# Patient Record
Sex: Male | Born: 1965 | Race: Black or African American | Hispanic: No | Marital: Married | Smoking: Never smoker
Health system: Southern US, Community
[De-identification: ages and names within clinical notes are randomized; demographics above are authoritative.]

## PROBLEM LIST (undated history)

## (undated) DIAGNOSIS — I10 Essential (primary) hypertension: Secondary | ICD-10-CM

## (undated) DIAGNOSIS — G4730 Sleep apnea, unspecified: Secondary | ICD-10-CM

## (undated) DIAGNOSIS — E119 Type 2 diabetes mellitus without complications: Secondary | ICD-10-CM

## (undated) DIAGNOSIS — E7800 Pure hypercholesterolemia, unspecified: Secondary | ICD-10-CM

## (undated) DIAGNOSIS — R609 Edema, unspecified: Secondary | ICD-10-CM

## (undated) DIAGNOSIS — R0602 Shortness of breath: Secondary | ICD-10-CM

## (undated) HISTORY — PX: FOOT SURGERY: SHX648

## (undated) HISTORY — DX: Shortness of breath: R06.02

## (undated) HISTORY — DX: Sleep apnea, unspecified: G47.30

## (undated) HISTORY — DX: Edema, unspecified: R60.9

## (undated) HISTORY — PX: KNEE SURGERY: SHX244

## (undated) HISTORY — PX: CARPAL TUNNEL RELEASE: SHX101

---

## 2002-09-27 ENCOUNTER — Emergency Department (HOSPITAL_COMMUNITY): Admission: EM | Admit: 2002-09-27 | Discharge: 2002-09-27 | Payer: Self-pay | Admitting: Emergency Medicine

## 2006-04-24 ENCOUNTER — Emergency Department (HOSPITAL_COMMUNITY): Admission: EM | Admit: 2006-04-24 | Discharge: 2006-04-24 | Payer: Self-pay | Admitting: Family Medicine

## 2008-11-02 ENCOUNTER — Inpatient Hospital Stay (HOSPITAL_COMMUNITY): Admission: EM | Admit: 2008-11-02 | Discharge: 2008-11-08 | Payer: Self-pay | Admitting: Emergency Medicine

## 2008-11-05 ENCOUNTER — Ambulatory Visit: Payer: Self-pay | Admitting: Physical Medicine & Rehabilitation

## 2009-01-07 ENCOUNTER — Encounter: Admission: RE | Admit: 2009-01-07 | Discharge: 2009-02-07 | Payer: Self-pay | Admitting: Orthopedic Surgery

## 2010-02-28 ENCOUNTER — Ambulatory Visit: Payer: Self-pay | Admitting: Diagnostic Radiology

## 2010-02-28 ENCOUNTER — Emergency Department (HOSPITAL_BASED_OUTPATIENT_CLINIC_OR_DEPARTMENT_OTHER): Admission: EM | Admit: 2010-02-28 | Discharge: 2010-02-28 | Payer: Self-pay | Admitting: Emergency Medicine

## 2010-12-29 LAB — URINALYSIS, ROUTINE W REFLEX MICROSCOPIC
Bilirubin Urine: NEGATIVE
Glucose, UA: >1000 mg/dL — AB
Hgb urine dipstick: NEGATIVE
Ketones, ur: 15 mg/dL — AB
Leukocytes, UA: NEGATIVE
Protein, ur: NEGATIVE mg/dL
Specific Gravity, Urine: >1.046 — ABNORMAL HIGH (ref 1.005–1.030)
Urobilinogen, UA: 1 mg/dL (ref 0.0–1.0)

## 2010-12-29 LAB — CBC
HCT: 35.1 % — ABNORMAL LOW (ref 39.0–52.0)
HCT: 35.1 % — ABNORMAL LOW (ref 39.0–52.0)
Hemoglobin: 11.9 g/dL — ABNORMAL LOW (ref 13.0–17.0)
MCHC: 34 g/dL (ref 30.0–36.0)
MCHC: 34 g/dL (ref 30.0–36.0)
MCV: 86.1 fL (ref 78.0–100.0)
MCV: 87.3 fL (ref 78.0–100.0)
Platelets: 268 10*3/uL (ref 150–400)
Platelets: 284 10*3/uL (ref 150–400)
RBC: 4.02 MIL/uL — ABNORMAL LOW (ref 4.22–5.81)
RDW: 14.7 % (ref 11.5–15.5)
RDW: 15.1 % (ref 11.5–15.5)
WBC: 10.9 10*3/uL — ABNORMAL HIGH (ref 4.0–10.5)
WBC: 9 10*3/uL (ref 4.0–10.5)

## 2010-12-29 LAB — DIFFERENTIAL
Basophils Absolute: 0 10*3/uL (ref 0.0–0.1)
Monocytes Absolute: 0.8 10*3/uL (ref 0.1–1.0)
Neutrophils Relative %: 90 % — ABNORMAL HIGH (ref 43–77)

## 2010-12-29 LAB — GLUCOSE, CAPILLARY
Glucose-Capillary: 134 mg/dL — ABNORMAL HIGH (ref 70–99)
Glucose-Capillary: 142 mg/dL — ABNORMAL HIGH (ref 70–99)
Glucose-Capillary: 173 mg/dL — ABNORMAL HIGH (ref 70–99)
Glucose-Capillary: 178 mg/dL — ABNORMAL HIGH (ref 70–99)
Glucose-Capillary: 179 mg/dL — ABNORMAL HIGH (ref 70–99)
Glucose-Capillary: 185 mg/dL — ABNORMAL HIGH (ref 70–99)
Glucose-Capillary: 193 mg/dL — ABNORMAL HIGH (ref 70–99)
Glucose-Capillary: 194 mg/dL — ABNORMAL HIGH (ref 70–99)
Glucose-Capillary: 196 mg/dL — ABNORMAL HIGH (ref 70–99)
Glucose-Capillary: 206 mg/dL — ABNORMAL HIGH (ref 70–99)
Glucose-Capillary: 208 mg/dL — ABNORMAL HIGH (ref 70–99)
Glucose-Capillary: 214 mg/dL — ABNORMAL HIGH (ref 70–99)
Glucose-Capillary: 223 mg/dL — ABNORMAL HIGH (ref 70–99)
Glucose-Capillary: 249 mg/dL — ABNORMAL HIGH (ref 70–99)
Glucose-Capillary: 266 mg/dL — ABNORMAL HIGH (ref 70–99)

## 2010-12-29 LAB — POCT I-STAT, CHEM 8
Calcium, Ion: 1.18 mmol/L (ref 1.12–1.32)
Glucose, Bld: 233 mg/dL — ABNORMAL HIGH (ref 70–99)
Hemoglobin: 15.3 g/dL (ref 13.0–17.0)
Potassium: 4.2 mEq/L (ref 3.5–5.1)

## 2010-12-29 LAB — COMPREHENSIVE METABOLIC PANEL
AST: 24 U/L (ref 0–37)
BUN: 12 mg/dL (ref 6–23)
Chloride: 105 mEq/L (ref 96–112)
GFR calc Af Amer: >60 mL/min (ref >60)
GFR calc non Af Amer: >60 mL/min (ref >60)
Glucose, Bld: 233 mg/dL — ABNORMAL HIGH (ref 70–99)
Potassium: 4.2 mEq/L (ref 3.5–5.1)
Sodium: 138 mEq/L (ref 135–145)
Total Bilirubin: 0.9 mg/dL (ref 0.3–1.2)

## 2010-12-29 LAB — BASIC METABOLIC PANEL
BUN: 7 mg/dL (ref 6–23)
CO2: 31 mEq/L (ref 19–32)
GFR calc non Af Amer: >60 mL/min (ref >60)
Glucose, Bld: 186 mg/dL — ABNORMAL HIGH (ref 70–99)
Potassium: 4.4 mEq/L (ref 3.5–5.1)

## 2010-12-29 LAB — URINE MICROSCOPIC-ADD ON
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
## 2016-11-29 ENCOUNTER — Ambulatory Visit (INDEPENDENT_AMBULATORY_CARE_PROVIDER_SITE_OTHER): Payer: BLUE CROSS/BLUE SHIELD

## 2016-11-29 ENCOUNTER — Encounter (HOSPITAL_COMMUNITY): Payer: Self-pay | Admitting: Emergency Medicine

## 2016-11-29 ENCOUNTER — Ambulatory Visit (HOSPITAL_COMMUNITY)
Admission: EM | Admit: 2016-11-29 | Discharge: 2016-11-29 | Disposition: A | Discharge status: Home / Self Care | Payer: BLUE CROSS/BLUE SHIELD | Attending: Family Medicine | Admitting: Family Medicine

## 2016-11-29 DIAGNOSIS — J9801 Acute bronchospasm: Secondary | ICD-10-CM | POA: Diagnosis not present

## 2016-11-29 DIAGNOSIS — J019 Acute sinusitis, unspecified: Secondary | ICD-10-CM

## 2016-11-29 HISTORY — DX: Pure hypercholesterolemia, unspecified: E78.00

## 2016-11-29 HISTORY — DX: Type 2 diabetes mellitus without complications: E11.9

## 2016-11-29 HISTORY — DX: Essential (primary) hypertension: I10

## 2016-11-29 MED ORDER — IPRATROPIUM-ALBUTEROL 0.5-2.5 (3) MG/3ML IN SOLN
RESPIRATORY_TRACT | Status: AC
  Filled 2016-11-29: qty 3

## 2016-11-29 MED ORDER — CEFDINIR 300 MG PO CAPS: 300.0000 mg | ORAL_CAPSULE | Freq: Two times a day (BID) | ORAL | 0 refills | Status: DC

## 2016-11-29 MED ORDER — ALBUTEROL SULFATE HFA 108 (90 BASE) MCG/ACT IN AERS: 2.0000 | INHALATION_SPRAY | RESPIRATORY_TRACT | 0 refills | Status: DC | PRN

## 2016-11-29 MED ORDER — IPRATROPIUM-ALBUTEROL 0.5-2.5 (3) MG/3ML IN SOLN
3.0000 mL | Freq: Once | RESPIRATORY_TRACT | Status: AC
  Administered 2016-11-29: 3 mL via RESPIRATORY_TRACT

## 2016-11-29 NOTE — ED Provider Notes (Signed)
CSN: [id number]     Arrival date & time 11/29/16  1746 History   First MD Initiated Contact with Patient 11/29/16 1844     Chief Complaint  Patient presents with  . Cough   (Consider location/radiation/quality/duration/timing/severity/associated sxs/prior Treatment) 51 year old obese male complaining of cough, sinus congestion for 3 weeks. He saw a health care provider and was prescribed amoxicillin. States he was better for 2 days and then his symptoms return. Is complaining of shortness of breath, chest pain when taking a deep breath, feeling weak and possibly a subjective fever although not taken. Currently temp is 99.5 pulse ox 100%.      Past Medical History:  Diagnosis Date  . Diabetes mellitus without complication ([name])   . High cholesterol   . Hypertension    History reviewed. No pertinent surgical history. No family history on file. Social History  Substance Use Topics  . Smoking status: Not on file  . Smokeless tobacco: Not on file  . Alcohol use Not on file    Review of Systems  Constitutional: Positive for activity change and fever.  HENT: Positive for congestion and sinus pressure. Negative for ear pain and sore throat.   Respiratory: Positive for cough and shortness of breath.   Gastrointestinal: Negative.   All other systems reviewed and are negative.   Allergies  Codeine  Home Medications   Prior to Admission medications   Medication Sig Start Date End Date Taking? Authorizing Provider  albuterol (PROVENTIL HFA;VENTOLIN HFA) 108 (90 Base) MCG/ACT inhaler Inhale 2 puffs into the lungs every 4 (four) hours as needed for wheezing or shortness of breath. 11/29/16   Hayden Rasmussen, NP  cefdinir (OMNICEF) 300 MG capsule Take 1 capsule (300 mg total) by mouth 2 (two) times daily. 11/29/16   Hayden Rasmussen, NP   Meds Ordered and Administered this Visit   Medications  ipratropium-albuterol (DUONEB) 0.5-2.5 (3) MG/3ML nebulizer solution 3 mL (3 mLs Nebulization Given  11/29/16 1910)    BP 139/68 (BP Location: Right Arm)   Pulse 70   Temp 99.5 F (37.5 C)   SpO2 100%  No data found.   Physical Exam  Constitutional: He is oriented to person, place, and time. He appears well-developed and well-nourished. No distress.  HENT:  Unable to visualize oropharynx due to large tongue and unattainable tongue retraction.  Eyes: EOM are normal.  Neck: Normal range of motion. Neck supple.  Cardiovascular: Normal rate.   Pulmonary/Chest: Effort normal and breath sounds normal. No respiratory distress. He has no wheezes. He has no rales.  Musculoskeletal: He exhibits no edema.  Lymphadenopathy:    He has no cervical adenopathy.  Neurological: He is alert and oriented to person, place, and time.  Skin: Skin is warm.  Nursing note and vitals reviewed.   Urgent Care Course     Procedures (including critical care time)  Labs Review Labs Reviewed - No data to display  Imaging Review Dg Chest 2 View  Result Date: 11/29/2016 CLINICAL DATA:  Cough with chest tightness for 3 weeks EXAM: CHEST  2 VIEW COMPARISON:  11/03/2008 FINDINGS: No focal pulmonary infiltrate, consolidation or effusion. Mild cardiomegaly without overt failure. No pneumothorax. IMPRESSION: 1. No radiographic evidence for acute cardiopulmonary abnormality 2. Mild cardiomegaly without overt failure Electronically Signed   By: Jasmine Pang M.D.   On: 11/29/2016 19:31     Visual Acuity Review  Right Eye Distance:   Left Eye Distance:   Bilateral Distance:    Right Eye Near:  Left Eye Near:    Bilateral Near:         MDM   1. Acute sinusitis, recurrence not specified, unspecified location   2. Cough due to bronchospasm    While examining the patient's lungs he continued to talk with his wife who was on the phone that just rang.  unable to get a good exam while he was talking. I stopped from moment to ask him to take really deep breaths so I could hear better and he then took the  phone away from his wife and started talking. At that point, rather than wait for him to finish his phone call left the room to complete the chart and discharge another patient. Meds ordered this encounter  Medications  . ipratropium-albuterol (DUONEB) 0.5-2.5 (3) MG/3ML nebulizer solution 3 mL  . albuterol (PROVENTIL HFA;VENTOLIN HFA) 108 (90 Base) MCG/ACT inhaler    Sig: Inhale 2 puffs into the lungs every 4 (four) hours as needed for wheezing or shortness of breath.    Dispense:  1 Inhaler    Refill:  0    Order Specific Question:   Supervising Provider    Answer:   Elvina SidleLAUENSTEIN, KURT [[id number]]  . cefdinir (OMNICEF) 300 MG capsule    Sig: Take 1 capsule (300 mg total) by mouth 2 (two) times daily.    Dispense:  14 capsule    Refill:  0    Order Specific Question:   Supervising Provider    Answer:   Elvina SidleLAUENSTEIN, KURT [[id number]]   I have verbally reviewed the discharge instructions with the patient. A printed AVS was given to the patient.  All questions were answered prior to discharge.      Hayden Rasmussenavid Zoiey Christy, NP 11/29/16 1952

## 2016-11-29 NOTE — ED Triage Notes (Signed)
See s/s 

## 2016-11-29 NOTE — Discharge Instructions (Signed)
Sudafed PE 10 mg every 4 to 6 hours as needed for congestion Allegra or Zyrtec daily as needed for drainage and runny nose. For stronger antihistamine may take Chlor-Trimeton 2 to 4 mg every 4 to 6 hours, may cause drowsiness. Saline nasal spray used frequently. Ibuprofen 600 mg every 6 hours as needed for pain, discomfort or fever. Drink plenty of fluids and stay well-hydrated. Use the albuterol inhaler 2 puffs every 4 hours for cough and suspected wheeze.  Antibiotic as directed

## 2016-12-02 ENCOUNTER — Encounter (HOSPITAL_COMMUNITY): Payer: Self-pay | Admitting: Emergency Medicine

## 2016-12-02 ENCOUNTER — Ambulatory Visit (HOSPITAL_COMMUNITY)
Admission: EM | Admit: 2016-12-02 | Discharge: 2016-12-02 | Disposition: A | Discharge status: Home / Self Care | Payer: BLUE CROSS/BLUE SHIELD | Attending: Family Medicine | Admitting: Family Medicine

## 2016-12-02 DIAGNOSIS — R059 Cough, unspecified: Secondary | ICD-10-CM

## 2016-12-02 DIAGNOSIS — R05 Cough: Secondary | ICD-10-CM

## 2016-12-02 DIAGNOSIS — J209 Acute bronchitis, unspecified: Secondary | ICD-10-CM | POA: Diagnosis not present

## 2016-12-02 MED ORDER — ALBUTEROL SULFATE (2.5 MG/3ML) 0.083% IN NEBU
INHALATION_SOLUTION | RESPIRATORY_TRACT | Status: AC
  Filled 2016-12-02: qty 3

## 2016-12-02 MED ORDER — BENZONATATE 100 MG PO CAPS: 200.0000 mg | ORAL_CAPSULE | Freq: Three times a day (TID) | ORAL | 0 refills | Status: DC | PRN

## 2016-12-02 MED ORDER — METHYLPREDNISOLONE 4 MG PO TBPK: ORAL_TABLET | ORAL | 0 refills | Status: DC

## 2016-12-02 MED ORDER — ALBUTEROL SULFATE (2.5 MG/3ML) 0.083% IN NEBU
2.5000 mg | INHALATION_SOLUTION | Freq: Once | RESPIRATORY_TRACT | Status: AC
  Administered 2016-12-02: 2.5 mg via RESPIRATORY_TRACT

## 2016-12-02 NOTE — ED Triage Notes (Addendum)
PT reports he was here Monday and diagnosed with bronchitis. PT has been on antibiotics since Monday. PT reports cough has persisted. PT reports cold sweats started today. PT reports chest pain, but only while coughing. PT describes pain while coughing as a chest tightness

## 2016-12-02 NOTE — ED Provider Notes (Signed)
CSN: [id number]     Arrival date & time 12/02/16  1221 History   First MD Initiated Contact with Patient 12/02/16 1234     Chief Complaint  Patient presents with  . Cough   (Consider location/radiation/quality/duration/timing/severity/associated sxs/prior Treatment) Patient c/o cough.  He has recently been seen and dx with sinus infection and bronchitis.  His cough is worse and more persistent.  He had a cxr which was normal.   The history is provided by the patient.  Cough  Cough characteristics:  Productive Sputum characteristics:  White Severity:  Severe Onset quality:  Sudden Duration:  1 week Timing:  Constant Progression:  Worsening Chronicity:  New Smoker: no   Context: upper respiratory infection and weather changes   Relieved by:  Nothing Worsened by:  Nothing Ineffective treatments:  None tried   Past Medical History:  Diagnosis Date  . Diabetes mellitus without complication ([name])   . High cholesterol   . Hypertension    History reviewed. No pertinent surgical history. No family history on file. Social History  Substance Use Topics  . Smoking status: Never Smoker  . Smokeless tobacco: Never Used  . Alcohol use No    Review of Systems  Constitutional: Negative.   HENT: Negative.   Eyes: Negative.   Respiratory: Positive for cough.   Cardiovascular: Negative.   Gastrointestinal: Negative.   Endocrine: Negative.   Genitourinary: Negative.   Musculoskeletal: Negative.   Allergic/Immunologic: Negative.   Neurological: Negative.   Hematological: Negative.   Psychiatric/Behavioral: Negative.     Allergies  Codeine  Home Medications   Prior to Admission medications   Medication Sig Start Date End Date Taking? Authorizing Provider  albuterol (PROVENTIL HFA;VENTOLIN HFA) 108 (90 Base) MCG/ACT inhaler Inhale 2 puffs into the lungs every 4 (four) hours as needed for wheezing or shortness of breath. 11/29/16   Hayden Rasmussenavid Mabe, NP  benzonatate (TESSALON) 100  MG capsule Take 2 capsules (200 mg total) by mouth 3 (three) times daily as needed for cough. 12/02/16   Deatra CanterWilliam J Tushar Enns, FNP  cefdinir (OMNICEF) 300 MG capsule Take 1 capsule (300 mg total) by mouth 2 (two) times daily. 11/29/16   Hayden Rasmussenavid Mabe, NP  methylPREDNISolone (MEDROL DOSEPAK) 4 MG TBPK tablet Take 6-5-4-3-2-1 po qd 12/02/16   Deatra CanterWilliam J Johnnie Goynes, FNP   Meds Ordered and Administered this Visit   Medications  albuterol (PROVENTIL) (2.5 MG/3ML) 0.083% nebulizer solution 2.5 mg (2.5 mg Nebulization Given 12/02/16 1306)    BP 138/84 (BP Location: Left Arm)   Pulse (!) 58   Temp 98.4 F (36.9 C) (Oral)   Resp 16   Ht 6' (1.829 m)   Wt (!) 364 lb (165.1 kg)   SpO2 96%   BMI 49.37 kg/m  No data found.   Physical Exam  Constitutional: He is oriented to person, place, and time. He appears well-developed and well-nourished.  HENT:  Head: Normocephalic and atraumatic.  Right Ear: External ear normal.  Left Ear: External ear normal.  Mouth/Throat: Oropharynx is clear and moist.  Eyes: Conjunctivae and EOM are normal. Pupils are equal, round, and reactive to light.  Neck: Normal range of motion. Neck supple.  Cardiovascular: Normal rate, regular rhythm and normal heart sounds.   Pulmonary/Chest: Effort normal and breath sounds normal.  Abdominal: Soft. Bowel sounds are normal.  Musculoskeletal: Normal range of motion.  Neurological: He is alert and oriented to person, place, and time.  Nursing note and vitals reviewed.   Urgent Care Course  Procedures (including critical care time)  Labs Review Labs Reviewed - No data to display  Imaging Review No results found.   Visual Acuity Review  Right Eye Distance:   Left Eye Distance:   Bilateral Distance:    Right Eye Near:   Left Eye Near:    Bilateral Near:         MDM   1. Acute bronchitis, unspecified organism   2. Cough    Neb tx Medrol dose pack as directed Tessalon Perle 200mg  one po tid prn #21 Continue  current abx's and continue with albuterol MDI  Push po fluids, rest, tylenol and motrin otc prn as directed for fever, arthralgias, and myalgias.  Follow up prn if sx's continue or persist.    Deatra Canter, FNP 12/02/16 1331

## 2018-11-01 ENCOUNTER — Other Ambulatory Visit: Payer: Self-pay | Admitting: Internal Medicine

## 2018-11-01 ENCOUNTER — Ambulatory Visit
Admission: RE | Admit: 2018-11-01 | Discharge: 2018-11-01 | Disposition: A | Discharge status: Home / Self Care | Payer: BLUE CROSS/BLUE SHIELD | Source: Ambulatory Visit | Attending: Internal Medicine | Admitting: Internal Medicine

## 2018-11-01 DIAGNOSIS — R05 Cough: Secondary | ICD-10-CM

## 2018-11-01 DIAGNOSIS — R059 Cough, unspecified: Secondary | ICD-10-CM

## 2020-10-23 ENCOUNTER — Ambulatory Visit
Admission: RE | Admit: 2020-10-23 | Discharge: 2020-10-23 | Disposition: A | Discharge status: Home / Self Care | Payer: 59 | Source: Ambulatory Visit | Attending: Internal Medicine | Admitting: Internal Medicine

## 2020-10-23 ENCOUNTER — Other Ambulatory Visit: Payer: Self-pay | Admitting: Internal Medicine

## 2020-10-23 DIAGNOSIS — J1282 Pneumonia due to coronavirus disease 2019: Secondary | ICD-10-CM

## 2020-10-23 DIAGNOSIS — U071 COVID-19: Secondary | ICD-10-CM

## 2020-12-03 ENCOUNTER — Other Ambulatory Visit: Payer: Self-pay

## 2020-12-03 ENCOUNTER — Ambulatory Visit (INDEPENDENT_AMBULATORY_CARE_PROVIDER_SITE_OTHER): Payer: 59 | Admitting: Pulmonary Disease

## 2020-12-03 ENCOUNTER — Encounter: Payer: Self-pay | Admitting: Pulmonary Disease

## 2020-12-03 VITALS — BP 138/88 | HR 94 | Temp 97.2°F | Ht 73.0 in | Wt >= 6400 oz

## 2020-12-03 DIAGNOSIS — R0683 Snoring: Secondary | ICD-10-CM

## 2020-12-03 DIAGNOSIS — U071 COVID-19: Secondary | ICD-10-CM

## 2020-12-03 DIAGNOSIS — R0600 Dyspnea, unspecified: Secondary | ICD-10-CM

## 2020-12-03 DIAGNOSIS — R0609 Other forms of dyspnea: Secondary | ICD-10-CM

## 2020-12-03 DIAGNOSIS — R600 Localized edema: Secondary | ICD-10-CM

## 2020-12-03 DIAGNOSIS — R079 Chest pain, unspecified: Secondary | ICD-10-CM

## 2020-12-03 DIAGNOSIS — J1282 Pneumonia due to coronavirus disease 2019: Secondary | ICD-10-CM

## 2020-12-03 LAB — COMPREHENSIVE METABOLIC PANEL
ALT: 11 U/L (ref 0–53)
AST: 10 U/L (ref 0–37)
Albumin: 4 g/dL (ref 3.5–5.2)
Alkaline Phosphatase: 60 U/L (ref 39–117)
BUN: 22 mg/dL (ref 6–23)
CO2: 30 mEq/L (ref 19–32)
Calcium: 9.6 mg/dL (ref 8.4–10.5)
Chloride: 98 mEq/L (ref 96–112)
Creatinine, Ser: 1 mg/dL (ref 0.40–1.50)
GFR: 85.61 mL/min (ref >60.00)
Glucose, Bld: 232 mg/dL — ABNORMAL HIGH (ref 70–99)
Potassium: 4.5 mEq/L (ref 3.5–5.1)
Sodium: 136 mEq/L (ref 135–145)
Total Bilirubin: 0.9 mg/dL (ref 0.2–1.2)
Total Protein: 7.6 g/dL (ref 6.0–8.3)

## 2020-12-03 LAB — BRAIN NATRIURETIC PEPTIDE: Pro B Natriuretic peptide (BNP): 14 pg/mL (ref 0.0–100.0)

## 2020-12-03 LAB — D-DIMER, QUANTITATIVE: D-Dimer, Quant: 0.31 mcg/mL FEU (ref <0.50)

## 2020-12-03 MED ORDER — FLOVENT HFA 110 MCG/ACT IN AERO: 2.0000 | INHALATION_SPRAY | Freq: Two times a day (BID) | RESPIRATORY_TRACT | 12 refills | Status: DC

## 2020-12-03 NOTE — Patient Instructions (Addendum)
Start Flovent 2 puffs twice daily  Use albuterol inhaler as needed 1-2 puffs every 4-6 hours for cough and shortness of breath  We will check labs today  We will schedule you for ultrasound of your lower extremities to evaluate the leg swelling.  We will schedule you for a home sleep study to evaluate for sleep apnea

## 2020-12-03 NOTE — Progress Notes (Signed)
Synopsis: Referred in March 2022 by Dr. Lorene Dy for post-covid 19 symptoms  Subjective:   PATIENT ID: Philip [name] GENDER: male DOB: Apr 18, 1966, MRN: [id number]  HPI  Chief Complaint  Patient presents with  . Consult    Covid PNA in Jan 2022, cough, very SOB, chest pain   Philip [name] is a 55 year old male, never smoker with diabetes and hypertension who is referred to pulmonary clinic for post-covid 19 symptoms.   He was sick with covid in January 2022. He was vaccinated but not boosted. He reports exertional dyspnea and dry cough since January. He also reports fatigue and brain fog. He denies wheezing. He is also having lower extremity edema which he was previously on lasix $Remove'20mg'abCjKfh$  daily. He does report occasional nightime awakenings with dyspnea and cough. He reports history of snoring.   He reports right sided chest pain that is worse with movement and coughing. The pain is not worse with eating.   Past Medical History:  Diagnosis Date  . Diabetes mellitus without complication ([place])   . High cholesterol   . Hypertension      History reviewed. No pertinent family history.   Social History   Socioeconomic History  . Marital status: Married    Spouse name: Not on file  . Number of children: Not on file  . Years of education: Not on file  . Highest education level: Not on file  Occupational History  . Not on file  Tobacco Use  . Smoking status: Never Smoker  . Smokeless tobacco: Never Used  Substance and Sexual Activity  . Alcohol use: No  . Drug use: No  . Sexual activity: Not on file  Other Topics Concern  . Not on file  Social History Narrative  . Not on file   Social Determinants of Health   Financial Resource Strain: Not on file  Food Insecurity: Not on file  Transportation Needs: Not on file  Physical Activity: Not on file  Stress: Not on file  Social Connections: Not on file  Intimate Partner Violence: Not on file     Allergies  Allergen  Reactions  . Metformin Diarrhea  . Codeine      Outpatient Medications Prior to Visit  Medication Sig Dispense Refill  . albuterol (PROVENTIL HFA;VENTOLIN HFA) 108 (90 Base) MCG/ACT inhaler Inhale 2 puffs into the lungs every 4 (four) hours as needed for wheezing or shortness of breath. 1 Inhaler 0  . atenolol (TENORMIN) 50 MG tablet Take 50 mg by mouth daily.    [name] atorvastatin (LIPITOR) 20 MG tablet Take 20 mg by mouth daily.    . benzonatate (TESSALON) 100 MG capsule Take 2 capsules (200 mg total) by mouth 3 (three) times daily as needed for cough. 21 capsule 0  . FARXIGA 10 MG TABS tablet Take 10 mg by mouth daily.    . furosemide (LASIX) 20 MG tablet Take 20 mg by mouth.    . cefdinir (OMNICEF) 300 MG capsule Take 1 capsule (300 mg total) by mouth 2 (two) times daily. 14 capsule 0  . methylPREDNISolone (MEDROL DOSEPAK) 4 MG TBPK tablet Take 6-5-4-3-2-1 po qd 21 tablet 0   No facility-administered medications prior to visit.    Review of Systems  Constitutional: Positive for malaise/fatigue. Negative for chills, fever and weight loss.  HENT: Negative for congestion, sinus pain and sore throat.   Eyes: Negative.   Respiratory: Positive for cough and shortness of breath. Negative for hemoptysis, sputum  production and wheezing.   Cardiovascular: Positive for leg swelling. Negative for chest pain, palpitations, orthopnea and claudication.  Gastrointestinal: Negative for abdominal pain, heartburn, nausea and vomiting.  Genitourinary: Negative.   Musculoskeletal: Negative for joint pain and myalgias.  Skin: Negative for rash.  Neurological: Negative for weakness.  Endo/Heme/Allergies: Negative.   Psychiatric/Behavioral: Negative.     Objective:   Vitals:   12/03/20 0930  BP: 138/88  Pulse: 94  Temp: (!) 97.2 F (36.2 C)  TempSrc: Oral  SpO2: 95%  Weight: (!) 419 lb 12.8 oz (190.4 kg)  Height: $Remove'6\' 1"'TFfNHlC$  (1.854 m)   Physical Exam Constitutional:      General: He is not in  acute distress.    Appearance: He is obese. He is not ill-appearing.  HENT:     Head: Normocephalic and atraumatic.  Eyes:     Extraocular Movements: Extraocular movements intact.     Conjunctiva/sclera: Conjunctivae normal.     Pupils: Pupils are equal, round, and reactive to light.  Cardiovascular:     Rate and Rhythm: Normal rate and regular rhythm.     Pulses: Normal pulses.     Heart sounds: Normal heart sounds. No murmur heard.   Pulmonary:     Effort: Pulmonary effort is normal.     Breath sounds: Decreased air movement present. No wheezing, rhonchi or rales.  Abdominal:     General: Bowel sounds are normal.     Palpations: Abdomen is soft.  Musculoskeletal:     Right lower leg: Edema present.     Left lower leg: Edema present.  Lymphadenopathy:     Cervical: No cervical adenopathy.  Skin:    General: Skin is warm and dry.  Neurological:     General: No focal deficit present.     Mental Status: He is alert.  Psychiatric:        Mood and Affect: Mood normal.        Behavior: Behavior normal.        Thought Content: Thought content normal.        Judgment: Judgment normal.    CBC    Component Value Date/Time   WBC 9.0 11/05/2008 0446   RBC 4.10 (L) 11/05/2008 0446   HGB 12.1 (L) 11/05/2008 0446   HCT 35.3 (L) 11/05/2008 0446   PLT 284 11/05/2008 0446   MCV 86.1 11/05/2008 0446   MCHC 34.2 11/05/2008 0446   RDW 14.8 11/05/2008 0446   LYMPHSABS 1.3 11/01/2008 1855   MONOABS 0.8 11/01/2008 1855   EOSABS 0.0 11/01/2008 1855   BASOSABS 0.0 11/01/2008 1855    Chest imaging: CXR 10/23/20 Heart size is normal. Mediastinal shadows are normal. The lungs are clear. No infiltrate, collapse or effusion. No significant bone Finding.  PFT: No flowsheet data found.  Assessment & Plan:   Pneumonia due to COVID-19 virus - Plan: fluticasone (FLOVENT HFA) 110 MCG/ACT inhaler  Bilateral lower extremity edema - Plan: D-Dimer, Quantitative, VAS [place] LOWER EXTREMITY  VENOUS (DVT), Comp Met (CMET), Comp Met (CMET), D-Dimer, Quantitative  Dyspnea on exertion - Plan: B Nat Peptide, B Nat Peptide  Chest pain, unspecified type - Plan: D-Dimer, Quantitative, D-Dimer, Quantitative  Snoring - Plan: Home sleep test  Discussion: Philip [name] is a 55 year old male, never smoker with diabetes and hypertension who is referred to pulmonary clinic for post-covid 19 symptoms.   He has exertional shortness of breath after having covid 19 infection which he likely had a component of pneumonia at that  time with chest radiograph on 10/23/20 showing no patchy opacities. His dyspnea also could be related to heart failure as he has lower extremity edema. Other concerns would be for DVT or PE. He is also obese which is contributing to his dyspnea with possible underlying sleep apnea.   We will check ddimer, BNP and CMP today. If ddimer is elevated then we will check a CTA chest. If BNP is elevated then we will check an ECHO.   We will check lower extremity US to rule out DVT.  He is to be scheduled for a home sleep study.  Follow up in 3 months.   Freda Jackson, MD [facility] Office: [phone]   Current Outpatient Medications:  .  albuterol (PROVENTIL HFA;VENTOLIN HFA) 108 (90 Base) MCG/ACT inhaler, Inhale 2 puffs into the lungs every 4 (four) hours as needed for wheezing or shortness of breath., Disp: 1 Inhaler, Rfl: 0 .  atenolol (TENORMIN) 50 MG tablet, Take 50 mg by mouth daily., Disp: , Rfl:  .  atorvastatin (LIPITOR) 20 MG tablet, Take 20 mg by mouth daily., Disp: , Rfl:  .  benzonatate (TESSALON) 100 MG capsule, Take 2 capsules (200 mg total) by mouth 3 (three) times daily as needed for cough., Disp: 21 capsule, Rfl: 0 .  FARXIGA 10 MG TABS tablet, Take 10 mg by mouth daily., Disp: , Rfl:  .  fluticasone (FLOVENT HFA) 110 MCG/ACT inhaler, Inhale 2 puffs into the lungs 2 (two) times daily., Disp: 1 each, Rfl: 12 .  furosemide (LASIX) 20  MG tablet, Take 20 mg by mouth., Disp: , Rfl:

## 2020-12-05 ENCOUNTER — Other Ambulatory Visit: Payer: Self-pay

## 2020-12-05 ENCOUNTER — Encounter: Payer: Self-pay | Admitting: *Deleted

## 2020-12-05 ENCOUNTER — Ambulatory Visit (HOSPITAL_COMMUNITY)
Admission: RE | Admit: 2020-12-05 | Discharge: 2020-12-05 | Disposition: A | Discharge status: Home / Self Care | Payer: 59 | Source: Ambulatory Visit | Attending: Pulmonary Disease | Admitting: Pulmonary Disease

## 2020-12-05 DIAGNOSIS — R600 Localized edema: Secondary | ICD-10-CM | POA: Diagnosis present

## 2020-12-17 ENCOUNTER — Telehealth: Payer: Self-pay | Admitting: Pulmonary Disease

## 2020-12-17 DIAGNOSIS — R0600 Dyspnea, unspecified: Secondary | ICD-10-CM

## 2020-12-17 DIAGNOSIS — R0609 Other forms of dyspnea: Secondary | ICD-10-CM

## 2020-12-17 MED ORDER — FUROSEMIDE 20 MG PO TABS: 20.0000 mg | ORAL_TABLET | Freq: Every day | ORAL | 1 refills | Status: DC

## 2020-12-17 NOTE — Telephone Encounter (Signed)
Has he been scheduled for the home sleep study yet? I believe his dyspnea is related to underlying sleep apnea.   Recommend continued weight loss.  Please send in prescription for lasix 20mg  daily. He will need to have a BMP checked 1 week after starting this medication to monitor his kidney function and electrolytes.   Thanks, 

## 2020-12-17 NOTE — Telephone Encounter (Signed)
Called and spoke with pt who states he still has complaints of SOB which is about the same since last visit. Pt states that he has been using the flovent inhaler twice daily as prescribed.  States that he tries not to use the albuterol inhaler due to it making him feel jittery.  Pt wants to know if there is something else that might be able to be recommended to help with his symptoms due to the SOB not getting any better.  Pt was made aware of the results of recent tests.  Dr. Francine Graven, please advise.

## 2020-12-17 NOTE — Telephone Encounter (Signed)
Called and spoke with pt letting him know the info stated by Dr. Francine Graven. Stated to pt that we were going to send Rx for Lasix to the pharmacy for him and he verbalized understanding. Verified pt's preferred pharmacy and have sent Rx in for pt.  Asked pt if he had heard anything yet about getting home sleep study scheduled and he stated that he had not heard yet. Know that [facility] are about 1-3 weeks out on getting pts scheduled for sleep studies. [facility], please advise on when we might be able to get pt in for HST?

## 2020-12-18 NOTE — Telephone Encounter (Signed)
Pt is scheduled for 4/18 @ 9: 30 AM

## 2020-12-29 ENCOUNTER — Other Ambulatory Visit: Payer: Self-pay

## 2020-12-29 ENCOUNTER — Ambulatory Visit: Payer: 59

## 2020-12-29 DIAGNOSIS — G4733 Obstructive sleep apnea (adult) (pediatric): Secondary | ICD-10-CM | POA: Diagnosis not present

## 2020-12-29 DIAGNOSIS — R0609 Other forms of dyspnea: Secondary | ICD-10-CM

## 2020-12-29 DIAGNOSIS — R0683 Snoring: Secondary | ICD-10-CM

## 2020-12-29 DIAGNOSIS — R0600 Dyspnea, unspecified: Secondary | ICD-10-CM

## 2020-12-29 LAB — BASIC METABOLIC PANEL
BUN: 28 mg/dL — ABNORMAL HIGH (ref 6–23)
CO2: 30 mEq/L (ref 19–32)
Calcium: 9.7 mg/dL (ref 8.4–10.5)
Chloride: 99 mEq/L (ref 96–112)
Creatinine, Ser: 1 mg/dL (ref 0.40–1.50)
GFR: 85.57 mL/min (ref >60.00)
Glucose, Bld: 222 mg/dL — ABNORMAL HIGH (ref 70–99)
Potassium: 4.2 mEq/L (ref 3.5–5.1)
Sodium: 136 mEq/L (ref 135–145)

## 2020-12-30 DIAGNOSIS — G4733 Obstructive sleep apnea (adult) (pediatric): Secondary | ICD-10-CM | POA: Diagnosis not present

## 2021-01-05 ENCOUNTER — Telehealth: Payer: Self-pay | Admitting: Pulmonary Disease

## 2021-01-05 DIAGNOSIS — G4733 Obstructive sleep apnea (adult) (pediatric): Secondary | ICD-10-CM

## 2021-01-05 NOTE — Telephone Encounter (Signed)
Called and spoke with pt who is requesting to know the results of his recent HST which was done 4/18. Dr. Francine Graven, please advise.

## 2021-01-07 NOTE — Telephone Encounter (Signed)
Patient has severe obstructive sleep apnea with oxygen desaturations down to 52% at times. Our sleep specialist has recommended he have a formal CPAP titration study performed. If he is amenable to this, please schedule him for CPAP titration study.   Has he noticed any benefit from the lasix?  Thanks, Philip [name]

## 2021-01-07 NOTE — Telephone Encounter (Signed)
Called and spoke with pt letting him know the results of the HST and stated to him that it is recommended for him to have a cpap titration study due to the HST showing severe OSA with drops in O2 sats. Pt verbalized understanding and stated he was fine with that being ordered so order has been placed.   Asked pt if he has noticed any benefit from the lasix but he states that he is still having problems with SOB as while I was on the phone with him, he had to pause multiple times to try to get his breath.

## 2021-01-07 NOTE — Telephone Encounter (Signed)
Thank you Jon!

## 2021-02-18 ENCOUNTER — Telehealth: Payer: Self-pay | Admitting: Pulmonary Disease

## 2021-02-18 DIAGNOSIS — R0609 Other forms of dyspnea: Secondary | ICD-10-CM

## 2021-02-18 DIAGNOSIS — M7989 Other specified soft tissue disorders: Secondary | ICD-10-CM

## 2021-02-18 NOTE — Telephone Encounter (Signed)
Called and spoke with pt and he stated that he is having issues with swelling, cough and SHOB.    Swelling in his legs, feel and stomach. He is having a dry cough and unable to cough up the phlegm.  Cough started back last week.  He is [facility] with exertion ( walking and standing and he has to rest at times)    He is taking lasix 20 mg daily and flovent 2 puffs BID.  He is scheduled for a sleep study on Monday.  He is wanting to know if he needs to be scheduled for an echo?  Or if JD has any recs.  Thanks  Allergies  Allergen Reactions  . Metformin Diarrhea  . Codeine

## 2021-02-18 NOTE — Telephone Encounter (Signed)
Please schedule patient for PFTs. He can be referred to cardiology for the dyspnea and lower extremity swelling. We will follow up the PFT and sleep study results.   He can take 40mg  of lasix over the next 3 days to see if this helps with the swelling.   Thanks, 

## 2021-02-18 NOTE — Telephone Encounter (Signed)
I have called the pt and he is aware of JD recs. Referral has been placed over to cardiology.   I have scheduled his PFT for 06/16 and he is aware to have the covid test done on 06/14.  He is also aware to take 40 mg of lasix x 3 days then he will go back to the lasix 20 mg daily.  I did call and leave a detailed message on the VM per pts request.  Advised him to call back for anything further .

## 2021-02-23 ENCOUNTER — Other Ambulatory Visit: Payer: Self-pay

## 2021-02-23 ENCOUNTER — Other Ambulatory Visit: Payer: Self-pay | Admitting: Pulmonary Disease

## 2021-02-23 ENCOUNTER — Ambulatory Visit (HOSPITAL_BASED_OUTPATIENT_CLINIC_OR_DEPARTMENT_OTHER): Payer: 59 | Attending: Pulmonary Disease | Admitting: Pulmonary Disease

## 2021-02-23 DIAGNOSIS — G4733 Obstructive sleep apnea (adult) (pediatric): Secondary | ICD-10-CM | POA: Diagnosis not present

## 2021-02-24 ENCOUNTER — Other Ambulatory Visit (HOSPITAL_COMMUNITY)
Admission: RE | Admit: 2021-02-24 | Discharge: 2021-02-24 | Disposition: A | Discharge status: Home / Self Care | Payer: 59 | Source: Ambulatory Visit | Attending: Pulmonary Disease | Admitting: Pulmonary Disease

## 2021-02-24 DIAGNOSIS — Z20822 Contact with and (suspected) exposure to covid-19: Secondary | ICD-10-CM | POA: Diagnosis not present

## 2021-02-24 DIAGNOSIS — Z01812 Encounter for preprocedural laboratory examination: Secondary | ICD-10-CM | POA: Diagnosis not present

## 2021-02-24 LAB — SARS CORONAVIRUS 2 (TAT 6-24 HRS): SARS Coronavirus 2: NEGATIVE

## 2021-02-25 ENCOUNTER — Other Ambulatory Visit: Payer: Self-pay | Admitting: Pulmonary Disease

## 2021-02-25 DIAGNOSIS — R0609 Other forms of dyspnea: Secondary | ICD-10-CM

## 2021-02-25 NOTE — Progress Notes (Signed)
pft  

## 2021-02-26 ENCOUNTER — Ambulatory Visit (INDEPENDENT_AMBULATORY_CARE_PROVIDER_SITE_OTHER): Payer: 59 | Admitting: Pulmonary Disease

## 2021-02-26 ENCOUNTER — Other Ambulatory Visit: Payer: Self-pay

## 2021-02-26 DIAGNOSIS — R0600 Dyspnea, unspecified: Secondary | ICD-10-CM

## 2021-02-26 DIAGNOSIS — R0609 Other forms of dyspnea: Secondary | ICD-10-CM

## 2021-02-26 DIAGNOSIS — G4733 Obstructive sleep apnea (adult) (pediatric): Secondary | ICD-10-CM

## 2021-02-26 LAB — PULMONARY FUNCTION TEST
DL/VA % pred: 115 %
DL/VA: 5.02 ml/min/mmHg/L
DLCO cor % pred: 50 %
DLCO cor: 15 ml/min/mmHg
DLCO unc % pred: 50 %
DLCO unc: 15 ml/min/mmHg
FEF 25-75 Post: 1.28 L/sec
FEF 25-75 Pre: 1.34 L/sec
FEF2575-%Change-Post: -4 %
FEF2575-%Pred-Post: 39 %
FEF2575-%Pred-Pre: 41 %
FEV1-%Change-Post: 0 %
FEV1-%Pred-Post: 46 %
FEV1-%Pred-Pre: 46 %
FEV1-Post: 1.57 L
FEV1-Pre: 1.58 L
FEV1FVC-%Change-Post: 3 %
FEV1FVC-%Pred-Pre: 96 %
FEV6-%Change-Post: -3 %
FEV6-%Pred-Post: 47 %
FEV6-%Pred-Pre: 49 %
FEV6-Post: 1.98 L
FEV6-Pre: 2.06 L
FEV6FVC-%Pred-Post: 103 %
FEV6FVC-%Pred-Pre: 103 %
FVC-%Change-Post: -3 %
FVC-%Pred-Post: 46 %
FVC-%Pred-Pre: 48 %
FVC-Post: 1.98 L
FVC-Pre: 2.06 L
Post FEV1/FVC ratio: 79 %
Post FEV6/FVC ratio: 100 %
Pre FEV1/FVC ratio: 77 %
Pre FEV6/FVC Ratio: 100 %
RV % pred: 111 %
RV: 2.43 L
TLC % pred: 67 %
TLC: 4.81 L

## 2021-02-26 NOTE — Procedures (Signed)
    Patient Name: Philip, [name] Date: 02/23/2021 Gender: Male D.O.B: Jun 06, 1966 Age (years): 55 Referring Provider: Melody Comas Height (inches): 71 Interpreting Physician: Coralyn Helling MD, ABSM Weight (lbs): 419 RPSGT: Lowry Ram BMI: 58 MRN: [id number] Neck Size: 19.50  CLINICAL INFORMATION The patient is referred for a CPAP titration to treat sleep apnea.  Date of HST 12/29/20: AHI 44, SpO2 low 52%.  SLEEP STUDY TECHNIQUE As per the AASM Manual for the Scoring of Sleep and Associated Events v2.3 (April 2016) with a hypopnea requiring 4% desaturations.  The channels recorded and monitored were frontal, central and occipital EEG, electrooculogram (EOG), submentalis EMG (chin), nasal and oral airflow, thoracic and abdominal wall motion, anterior tibialis EMG, snore microphone, electrocardiogram, and pulse oximetry. Bilevel positive airway pressure (BPAP) was initiated at the beginning of the study and titrated to treat sleep-disordered breathing.  MEDICATIONS Medications self-administered by patient taken the night of the study : N/A  RESPIRATORY PARAMETERS Optimal CPAP Pressure (cm): 19 AHI at Optimal Pressure (/hr) 8.9 Overall Minimal O2 (%): 69.00 Minimal O2 at Optimal Pressure (%): 85.0  SLEEP ARCHITECTURE Start Time: 10:51:25 PM Stop Time: 5:08:41 AM Total Time (min): 377.3 Total Sleep Time (min): 335.8 Sleep Latency (min): 6.0 Sleep Efficiency (%): 89.0 REM Latency (min): 96.5 WASO (min): 35.5 Stage N1 (%): 10.81 Stage N2 (%): 65.07 Stage N3 (%): 0.00 Stage R (%): 24.1 Supine (%): 61.58 Arousal Index (/hr): 24.3   CARDIAC DATA The 2 lead EKG demonstrated sinus rhythm. The mean heart rate was 95.59 beats per minute. Other EKG findings include: PVCs.  LEG MOVEMENT DATA The total Periodic Limb Movements of Sleep (PLMS) were 64. The PLMS index was 11.44. A PLMS index of <15 is considered normal in adults.  IMPRESSIONS - He did best with CPAP 19 cm H2O.   - He did not require the use of supplemental oxygen during this study.  DIAGNOSIS - Obstructive Sleep Apnea (G47.33)  RECOMMENDATIONS - Trial of CPAP therapy on 19 cm H2O with a Medium size Fisher&Paykel Full Face Mask Simplus mask and heated humidification. - Avoid alcohol, sedatives and other CNS depressants that may worsen sleep apnea and disrupt normal sleep architecture. - Sleep hygiene should be reviewed to assess factors that may improve sleep quality. - Weight management and regular exercise should be initiated or continued.  [Electronically signed] 02/26/2021 02:18 PM  Coralyn Helling MD, ABSM Diplomate, American Board of Sleep Medicine   NPI: [id number]

## 2021-02-26 NOTE — Progress Notes (Signed)
PFT done today. 

## 2021-02-27 ENCOUNTER — Other Ambulatory Visit: Payer: Self-pay

## 2021-02-27 DIAGNOSIS — R0602 Shortness of breath: Secondary | ICD-10-CM

## 2021-03-05 ENCOUNTER — Ambulatory Visit (INDEPENDENT_AMBULATORY_CARE_PROVIDER_SITE_OTHER)
Admission: RE | Admit: 2021-03-05 | Discharge: 2021-03-05 | Disposition: A | Discharge status: Home / Self Care | Payer: 59 | Source: Ambulatory Visit | Attending: Pulmonary Disease | Admitting: Pulmonary Disease

## 2021-03-05 ENCOUNTER — Inpatient Hospital Stay: Admission: RE | Admit: 2021-03-05 | Payer: 59 | Source: Ambulatory Visit

## 2021-03-05 ENCOUNTER — Other Ambulatory Visit: Payer: Self-pay

## 2021-03-05 ENCOUNTER — Telehealth: Payer: Self-pay

## 2021-03-05 DIAGNOSIS — G4733 Obstructive sleep apnea (adult) (pediatric): Secondary | ICD-10-CM

## 2021-03-05 DIAGNOSIS — R0602 Shortness of breath: Secondary | ICD-10-CM | POA: Diagnosis not present

## 2021-03-05 NOTE — Telephone Encounter (Signed)
-----   Message from Martina Sinner, MD sent at 02/27/2021  1:58 AM EDT ----- Regarding: CPAP Order Hi Soren Lazarz,  Please place orders for a CPAP machine for this patient.   CPAP therapy on 19 cm H2O with a Medium size Fisher&Paykel Full Face Mask Simplus mask and heated humidification.  He will also need follow up with one of our sleep physicians in the future for his OSA.  Thanks, Cletis Athens

## 2021-03-05 NOTE — Telephone Encounter (Signed)
Called patient but he did not answer. Left message for patient to call back.  °

## 2021-03-05 NOTE — Telephone Encounter (Signed)
Called and spoke with patient. He wishes to proceed with the cpap order. I explained to him the process of getting the machine. He wants to hold off on scheduling an appt with a sleep doctor until he has the machine.   Order has been placed. Nothing further needed.

## 2021-03-17 NOTE — Telephone Encounter (Signed)
Hello Dr. Francine Graven, please advise on mychart message:  Dr. Francine Graven,   I need all test results, office notes and a diagnosis asap. Dr. Su Hilt says I have long covid. Is this also your diagnosis? I have not been able to work since the first of January 2022 due to covid-19 and pneumonia.  I am due in [place] on Thursday for court and need the documentation for my illness to take with me or to be emailed to me by close of business on Wednesday, July, 6, 2022. My email is [email or web address]   Please let me know if you need anything else from me to complete my request.   Thanks, Philip [name].  Thank you!

## 2021-05-01 ENCOUNTER — Encounter: Payer: Self-pay | Admitting: Cardiovascular Disease

## 2021-05-01 ENCOUNTER — Ambulatory Visit (INDEPENDENT_AMBULATORY_CARE_PROVIDER_SITE_OTHER): Payer: 59 | Admitting: Cardiovascular Disease

## 2021-05-01 ENCOUNTER — Other Ambulatory Visit: Payer: Self-pay

## 2021-05-01 VITALS — BP 134/70 | HR 69 | Ht 73.0 in | Wt >= 6400 oz

## 2021-05-01 DIAGNOSIS — R600 Localized edema: Secondary | ICD-10-CM | POA: Diagnosis not present

## 2021-05-01 DIAGNOSIS — R0602 Shortness of breath: Secondary | ICD-10-CM | POA: Diagnosis not present

## 2021-05-01 NOTE — Progress Notes (Signed)
Cardiology Office Note:   Date:  05/01/2021  NAME:  Philip [name]    MRN: [id number] DOB:  10/10/1965   PCP:  Burton Apley, MD  Cardiologist:  None  Electrophysiologist:  None   Referring MD: Martina Sinner, MD   Chief Complaint  Patient presents with   Shortness of Breath    History of Present Illness:   Philip [name] is a 55 y.o. male with a hx of DM. HTN, HLD, obesity who is being seen today for the evaluation of SOB at the request of Martina Sinner, MD. Seen by Pulmonary for SOB after Covid. Has severe OSA. Had LE edema. BNP normal. DVT study negative.  He reports he had COVID-19 pneumonia in January 2022.  Since that time he has had shortness of breath and tightness in his chest.  This occurs with exertion.  He also reports lower extremity edema.  He is undergone pulmonary function test that shows restrictive pattern on PFTs.  He also had a diffusion defect.  He underwent high-resolution chest CT which demonstrated no evidence of interstitial lung disease.  He had no evidence of interstitial edema or dilation of the pulmonary artery.  He has had some lower extremity edema.  He reports that he was started on Lasix with improvement.  He has no evidence of volume overload today on exam.  Lungs are clear.  I reviewed a chest x-ray that was clear as well.  There was mention of calcium in the aortic valve.  He has no murmur.  There was minimal calcification in the LAD territory.  His EKG today in office demonstrates sinus rhythm with no acute ischemic changes or evidence of infarction.  His medical history is significant for diabetes, hypertension, hyperlipidemia and morbid obesity.  His BMI is 55.  Weight is 418 pounds.  He reports heart disease in his mother and father.  He has never had a heart attack or stroke.  He does not smoke.  No drugs or alcohol reported.  He has 1 child.  He is married.  His symptoms of shortness of breath occur daily.  He gets profoundly winded when he does  any activity.  Symptoms are minimal at rest.  He can get short of breath with talking.  Problem List DM HTN HLD Severe OSA Morbid Obesity -BMI 58 6.  Restrictive lung disease  Past Medical History: Past Medical History:  Diagnosis Date   Diabetes mellitus without complication ([name])    High cholesterol    Hypertension     Past Surgical History: Past Surgical History:  Procedure Laterality Date   CARPAL TUNNEL RELEASE     KNEE SURGERY      Current Medications: Current Meds  Medication Sig   albuterol (PROVENTIL HFA;VENTOLIN HFA) 108 (90 Base) MCG/ACT inhaler Inhale 2 puffs into the lungs every 4 (four) hours as needed for wheezing or shortness of breath.   atorvastatin (LIPITOR) 20 MG tablet Take 20 mg by mouth daily.   FARXIGA 10 MG TABS tablet Take 10 mg by mouth daily.   fluticasone (FLOVENT HFA) 110 MCG/ACT inhaler Inhale 2 puffs into the lungs 2 (two) times daily.   furosemide (LASIX) 20 MG tablet Take 1 tablet by mouth once daily   glyBURIDE (DIABETA) 5 MG tablet Take 10 mg by mouth 2 (two) times daily.   loratadine (CLARITIN) 10 MG tablet Take 10 mg by mouth daily.   losartan-hydrochlorothiazide (HYZAAR) 50-12.5 MG tablet Take 1 tablet by mouth daily.  pioglitazone (ACTOS) 45 MG tablet Take 45 mg by mouth daily.     Allergies:    Metformin and Codeine   Social History: Social History   Socioeconomic History   Marital status: Married    Spouse name: Not on file   Number of children: 1   Years of education: Not on file   Highest education level: Not on file  Occupational History   Not on file  Tobacco Use   Smoking status: Never   Smokeless tobacco: Never  Substance and Sexual Activity   Alcohol use: No   Drug use: No   Sexual activity: Not on file  Other Topics Concern   Not on file  Social History Narrative   Works at [employer] and [employer]   Social Determinants of Corporate investment banker Strain: Not on file  Food Insecurity: No Food  Insecurity   Worried About Programme researcher, broadcasting/film/video in the Last Year: Never true   Barista in the Last Year: Never true  Transportation Needs: No Transportation Needs   Lack of Transportation (Medical): No   Lack of Transportation (Non-Medical): No  Physical Activity: Not on file  Stress: Not on file  Social Connections: Not on file     Family History: The patient's family history includes Heart disease in his father and mother.  ROS:   All other ROS reviewed and negative. Pertinent positives noted in the HPI.     EKGs/Labs/Other Studies Reviewed:   The following studies were personally reviewed by me today:  EKG:  EKG is ordered today.  The ekg ordered today demonstrates normal sinus rhythm heart rate 69, left axis deviation, and was personally reviewed by me.   Recent Labs: 12/03/2020: ALT 11; Pro B Natriuretic peptide (BNP) 14.0 12/29/2020: BUN 28; Creatinine, Ser 1.00; Potassium 4.2; Sodium 136   Recent Lipid Panel No results found for: CHOL, TRIG, HDL, CHOLHDL, VLDL, LDLCALC, LDLDIRECT  Physical Exam:   VS:  BP 134/70 (BP Location: Left Arm, Patient Position: Sitting, Cuff Size: Large) Comment (Cuff Size): Thigh cuff  Pulse 69   Ht 6\' 1"  (1.854 m)   Wt (!) 418 lb 9.6 oz (189.9 kg)   SpO2 99%   BMI 55.23 kg/m    Wt Readings from Last 3 Encounters:  05/01/21 (!) 418 lb 9.6 oz (189.9 kg)  02/23/21 (!) 419 lb (190.1 kg)  12/03/20 (!) 419 lb 12.8 oz (190.4 kg)    General: Well nourished, well developed, in no acute distress Head: Atraumatic, normal size  Eyes: PEERLA, EOMI  Neck: Supple, no JVD Endocrine: No thryomegaly Cardiac: Normal S1, S2; RRR; no murmurs, rubs, or gallops Lungs: Clear to auscultation bilaterally, no wheezing, rhonchi or rales  Abd: Soft, nontender, no hepatomegaly  Ext: No edema, pulses 2+ Musculoskeletal: No deformities, BUE and BLE strength normal and equal Skin: Warm and dry, no rashes   Neuro: Alert and oriented to person, place, time,  and situation, CNII-XII grossly intact, no focal deficits  Psych: Normal mood and affect   ASSESSMENT:   [name] is a 55 y.o. male who presents for the following: 1. SOB (shortness of breath) on exertion   2. Bilateral lower extremity edema   3. Obesity, morbid, BMI 50 or higher ([name])     PLAN:   1. SOB (shortness of breath) on exertion 2. Bilateral lower extremity edema -Shortness of breath with exertion.  Occurred after COVID-19 pneumonia.  PFTs with restrictive pattern and moderate diffusion  defect.  High-resolution chest CT shows no evidence of interstitial lung disease.  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  This is suspected by his primary pulmonologist as well.  He has no evidence of congestive heart failure on exam.  He takes Lasix for what I suspect is venous insufficiency.  He has no elevated JVD.  I reviewed his CT scan which shows minimal calcifications aortic valve.  There is no murmur.  He has minimal LAD calcifications.  He really just needs to continue on a statin.  Symptoms are not concerning for angina.  I think with clear restrictive lung pattern on PFTs this explains a diagnosis.  His EKG is nonischemic as well.  We will complete his work-up with an echocardiogram.  He can continue Lasix as needed for what I suspect is venous insufficiency.  His home condition can likely be treated by weight loss.  I think all of his issues will be resolved with weight loss.  He would like to be seen by healthy weight and wellness.  I think he should consider weight loss surgery.  For now we will continue with Lasix.  I will see him back in 3 months to discuss further  Further recommendations after we obtain his echo.  3. Obesity, morbid, BMI 50 or higher ([name]) -Referral to healthy weight and wellness.  Should really consider weight loss surgery.  Disposition: Return in about 3 months (around 08/01/2021).  Medication Adjustments/Labs and Tests Ordered: Current medicines  are reviewed at length with the patient today.  Concerns regarding medicines are outlined above.  Orders Placed This Encounter  Procedures   Amb Ref to Medical Weight Management   EKG 12-Lead   ECHOCARDIOGRAM COMPLETE    No orders of the defined types were placed in this encounter.   Patient Instructions  Medication Instructions:  The current medical regimen is effective;  continue present plan and medications.  *If you need a refill on your cardiac medications before your next appointment, please call your pharmacy*   Testing/Procedures: Echocardiogram - Your physician has requested that you have an echocardiogram. Echocardiography is a painless test that uses sound waves to create images of your heart. It provides your doctor with information about the size and shape of your heart and how well your heart's chambers and valves are working. This procedure takes approximately one hour. There are no restrictions for this procedure. [facility] - [address], [address].    Follow-Up: At [facility], you and your health needs are our priority.  As part of our continuing mission to provide you with exceptional heart care, we have created designated Provider Care Teams.  These Care Teams include your primary Cardiologist (physician) and Advanced Practice Providers (APPs -  Physician Assistants and Nurse Practitioners) who all work together to provide you with the care you need, when you need it.  We recommend signing up for the patient portal called "MyChart".  Sign up information is provided on this After Visit Summary.  MyChart is used to connect with patients for Virtual Visits (Telemedicine).  Patients are able to view lab/test results, encounter notes, upcoming appointments, etc.  Non-urgent messages can be sent to your provider as well.   To learn more about what you can do with MyChart, go to [email or web address].    Your next appointment:    3 month(s)  The format for your next appointment:   In Person  Provider:   Gerri SporeWesley  Flora Lipps, MD    Signed, Lenna Gilford. Flora Lipps, MD, [facility]  [facility]  [address] [place], [place] [address] [phone]  05/01/2021 11:58 AM

## 2021-05-01 NOTE — Patient Instructions (Signed)
Medication Instructions:  The current medical regimen is effective;  continue present plan and medications.  *If you need a refill on your cardiac medications before your next appointment, please call your pharmacy*   Testing/Procedures: Echocardiogram - Your physician has requested that you have an echocardiogram. Echocardiography is a painless test that uses sound waves to create images of your heart. It provides your doctor with information about the size and shape of your heart and how well your heart's chambers and valves are working. This procedure takes approximately one hour. There are no restrictions for this procedure. This will be performed at our [address] location - [address], [address].    Follow-Up: At [facility], you and your health needs are our priority.  As part of our continuing mission to provide you with exceptional heart care, we have created designated Provider Care Teams.  These Care Teams include your primary Cardiologist (physician) and Advanced Practice Providers (APPs -  Physician Assistants and Nurse Practitioners) who all work together to provide you with the care you need, when you need it.  We recommend signing up for the patient portal called "MyChart".  Sign up information is provided on this After Visit Summary.  MyChart is used to connect with patients for Virtual Visits (Telemedicine).  Patients are able to view lab/test results, encounter notes, upcoming appointments, etc.  Non-urgent messages can be sent to your provider as well.   To learn more about what you can do with MyChart, go to [email or web address].    Your next appointment:   3 month(s)  The format for your next appointment:   In Person  Provider:   [place] O'Neal, MD      

## 2021-05-15 ENCOUNTER — Telehealth: Payer: Self-pay | Admitting: Pulmonary Disease

## 2021-05-18 ENCOUNTER — Other Ambulatory Visit: Payer: Self-pay | Admitting: Pulmonary Disease

## 2021-05-19 ENCOUNTER — Other Ambulatory Visit: Payer: Self-pay

## 2021-05-19 ENCOUNTER — Ambulatory Visit (HOSPITAL_COMMUNITY): Payer: 59 | Attending: Internal Medicine

## 2021-05-19 DIAGNOSIS — R0602 Shortness of breath: Secondary | ICD-10-CM | POA: Diagnosis not present

## 2021-05-19 LAB — ECHOCARDIOGRAM COMPLETE
Area-P 1/2: 2.91 cm2
S' Lateral: 3.5 cm

## 2021-05-19 MED ORDER — PERFLUTREN LIPID MICROSPHERE
1.0000 mL | INTRAVENOUS | Status: AC | PRN
  Administered 2021-05-19: 2 mL via INTRAVENOUS

## 2021-05-19 NOTE — Telephone Encounter (Signed)
Spoke with the pt  He states that someone from out office called him and told him he needed a visit  He states that they advised that we need to eval his o2  He does not have any o2 already  He is overdue f/u  I do not see that we called him, but made appt for f/u since he was overdue  Nothing further needed

## 2021-05-26 ENCOUNTER — Ambulatory Visit (INDEPENDENT_AMBULATORY_CARE_PROVIDER_SITE_OTHER): Payer: 59 | Admitting: Adult Health

## 2021-05-26 ENCOUNTER — Encounter: Payer: Self-pay | Admitting: Adult Health

## 2021-05-26 ENCOUNTER — Other Ambulatory Visit: Payer: Self-pay

## 2021-05-26 DIAGNOSIS — R0600 Dyspnea, unspecified: Secondary | ICD-10-CM

## 2021-05-26 DIAGNOSIS — R5381 Other malaise: Secondary | ICD-10-CM | POA: Diagnosis not present

## 2021-05-26 DIAGNOSIS — G4733 Obstructive sleep apnea (adult) (pediatric): Secondary | ICD-10-CM

## 2021-05-26 DIAGNOSIS — J984 Other disorders of lung: Secondary | ICD-10-CM

## 2021-05-26 DIAGNOSIS — U099 Post covid-19 condition, unspecified: Secondary | ICD-10-CM | POA: Insufficient documentation

## 2021-05-26 DIAGNOSIS — R0609 Other forms of dyspnea: Secondary | ICD-10-CM

## 2021-05-26 LAB — CBC WITH DIFFERENTIAL/PLATELET
Basophils Absolute: 0.1 10*3/uL (ref 0.0–0.1)
Basophils Relative: 1.2 % (ref 0.0–3.0)
Eosinophils Absolute: 0.2 10*3/uL (ref 0.0–0.7)
Eosinophils Relative: 2.2 % (ref 0.0–5.0)
HCT: 38.1 % — ABNORMAL LOW (ref 39.0–52.0)
Hemoglobin: 12.1 g/dL — ABNORMAL LOW (ref 13.0–17.0)
Lymphocytes Relative: 20.9 % (ref 12.0–46.0)
Lymphs Abs: 1.7 10*3/uL (ref 0.7–4.0)
MCHC: 31.7 g/dL (ref 30.0–36.0)
MCV: 86.3 fl (ref 78.0–100.0)
Monocytes Absolute: 0.6 10*3/uL (ref 0.1–1.0)
Monocytes Relative: 7 % (ref 3.0–12.0)
Neutro Abs: 5.7 10*3/uL (ref 1.4–7.7)
Neutrophils Relative %: 68.7 % (ref 43.0–77.0)
Platelets: 299 10*3/uL (ref 150.0–400.0)
RBC: 4.41 Mil/uL (ref 4.22–5.81)
RDW: 16.7 % — ABNORMAL HIGH (ref 11.5–15.5)
WBC: 8.3 10*3/uL (ref 4.0–10.5)

## 2021-05-26 MED ORDER — FLUTICASONE FUROATE-VILANTEROL 100-25 MCG/INH IN AEPB: 1.0000 | INHALATION_SPRAY | Freq: Every day | RESPIRATORY_TRACT | 0 refills | Status: DC

## 2021-05-26 MED ORDER — FLUTICASONE FUROATE-VILANTEROL 100-25 MCG/INH IN AEPB: 1.0000 | INHALATION_SPRAY | Freq: Every day | RESPIRATORY_TRACT | 5 refills | Status: DC

## 2021-05-26 NOTE — Patient Instructions (Signed)
Refer to Pulmonary Rehab.  Healthy weight loss, agree with healthy weight and wellness referral  Stop Flovent  Begin BREO 1 puff daily  Support stocking during daytime remove at bedtime .  Activity as tolerated.  Albuterol inhaler As needed   Continue on CPAP At bedtime   Do not drive if sleepy .  Follow with Dr. Francine Graven in 2 months and As needed

## 2021-05-26 NOTE — Addendum Note (Signed)
Addended bySandra Cockayne on: 05/26/2021 05:16 PM   Modules accepted: Orders

## 2021-05-26 NOTE — Assessment & Plan Note (Signed)
Severe obstructive sleep apnea.  CPAP titration study showed optimal control on CPAP at 19 cm H2O.  Continue on CPAP at bedtime.  Patient education on sleep apnea.  Plan    Patient Instructions  Refer to Pulmonary Rehab.  Healthy weight loss, agree with healthy weight and wellness referral  Stop Flovent  Begin BREO 1 puff daily  Support stocking during daytime remove at bedtime .  Activity as tolerated.  Albuterol inhaler As needed   Continue on CPAP At bedtime   Do not drive if sleepy .  Follow with Dr. Francine Graven in 2 months and As needed

## 2021-05-26 NOTE — Assessment & Plan Note (Signed)
Current weight 425 pounds BMI 56.  Patient has underlying diabetes.  Agree with referral to healthy weight and wellness.  Healthy diet discussed with patient referral to pulmonary rehab for exercise

## 2021-05-26 NOTE — Assessment & Plan Note (Signed)
Patient is a never smoker.  PFTs show significant moderate to severe restriction.  May have a component of some underlying reactive airway/asthma since COVID-19 also morbid obesity with BMI 56 is also contributing factor. Patient has had some clinical benefit with Flovent.  We will try Breo to see if this gives him any improvement in clinical symptoms.  Plan  Patient Instructions  Refer to Pulmonary Rehab.  Healthy weight loss, agree with healthy weight and wellness referral  Stop Flovent  Begin BREO 1 puff daily  Support stocking during daytime remove at bedtime .  Activity as tolerated.  Albuterol inhaler As needed   Continue on CPAP At bedtime   Do not drive if sleepy .  Follow with Dr. Francine Graven in 2 months and As needed

## 2021-05-26 NOTE — Assessment & Plan Note (Signed)
Patient continues to have long-haul COVID symptoms with activity intolerance shortness of breath and fatigue. Continue with supportive care.  Refer to pulmonary rehab.

## 2021-05-26 NOTE — Assessment & Plan Note (Signed)
Severe physical deconditioning.  Patient needs referral to pulmonary rehab with underlying restrictive lung disease and possible asthma.

## 2021-05-26 NOTE — Assessment & Plan Note (Addendum)
Dyspnea with exertion suspect is multifactorial in nature with underlying severe morbid obesity, moderate to severe restrictive lung disease, possible component of reactive airways/asthma, severe physical deconditioning Cardiac work-up shows no evidence of congestive heart failure.  There was no mention of pulmonary hypertension on echo.  RV size was normal and RV systolic function was normal.   Check cbc   Plan Patient Instructions  Refer to Pulmonary Rehab.  Healthy weight loss, agree with healthy weight and wellness referral  Stop Flovent  Begin BREO 1 puff daily  Support stocking during daytime remove at bedtime .  Activity as tolerated.  Albuterol inhaler As needed   Continue on CPAP At bedtime   Do not drive if sleepy .  Follow with Dr. Francine Graven in 2 months and As needed

## 2021-05-26 NOTE — Addendum Note (Signed)
Addended bySandra Cockayne on: 05/26/2021 11:06 AM   Modules accepted: Orders

## 2021-05-26 NOTE — Progress Notes (Signed)
$'@Patient'N$  ID: Philip [name], male    DOB: June 26, 1966, 54 y.o.   MRN: [id number]  Chief Complaint  Patient presents with   Follow-up    Referring provider: Lorene Dy, MD  HPI: 55 year old male never smoker seen for pulmonary consult December 03, 2020 for shortness of breath after COVID-19 and COVID-pneumonia January 2022.  TEST/EVENTS :  Home sleep study January 05, 2021 showed severe obstructive sleep apnea with oxygen desaturations down to 52%.  (AHI 44)  CPAP titration study 6/13/22showed optimal control at CPAP therapy at 19 cm H2O.   High-resolution CT chest March 05, 2021 negative for interstitial lung disease.  Lungs clear Hepatic steatosis and aortic atherosclerosis and calcifications on aortic valve.  Venous Doppler December 05, 2020 negative for DVT  2D echo May 19, 2021 normal EF 55 to 60%, right ventricular systolic function normal.  RV size is normal.  PFTs February 26, 2021 showed moderate to severe restriction with FEV1 at 46%, ratio 79, FVC 46%, no significant bronchodilator response, DLCO was 50%.   05/26/2021 Follow up : Dyspnea and OSA  Patient returns for follow-up visit.  Patient was seen earlier this year December 03, 2020 for shortness of breath after COVID-19 infection and COVID-pneumonia in January 2022.  Work-up with  PFTs February 26, 2021 showed moderate to severe restriction.  High-resolution CT chest March 05, 2021 was negative for interstitial lung disease.  Lungs were clear.  Patient's weight is 425 pounds with a BMI of 56. Venous Dopplers were negative for DVT.  Patient was referred to cardiology.  Recent echo showed normal EF and right ventricular systolic function.  Cardiology notes were reviewed.  No evidence of congestive heart failure.  He was referred to healthy weight and wellness for weight loss. Since last visit patient says still has significant shortness of breath with minimum activities.  Is unable to do his yard work.  Gets winded if he tries to walk  any amount of distance.  Even if he sitting still and talks for prolonged period of time he gets short of breath.  He is unable to go upstairs do any type of work where he bends over.  Does have some occasional dry cough. Was started on Flovent . Feels it has helped. Uses Albuterol inhaler on occasion .  Walk test today shows no desats with ambulation on room air.   Prior to Covid in 09/2020 worked fulltime . Was able to do house work and  yard work  Weight is up 50-60lbs over last 9 months. Has not been able to go back to work  Has chronic dyspnea with activity and minimal activities. Some dry cough . Chronic leg swelling and retaining fluid. Remains on Lasix $Remove'20mg'hKEjusC$  daily .   Found to have severe OSA . Went for CPAP titration study . Got CPAP last week. Started last few days.  Review over the last 4 days shows 100% compliance with daily average AHI around 5.0 and daily usage around 7 hours.  Patient says he is trying to get used to it still little difficult at times.   Allergies  Allergen Reactions   Metformin Diarrhea   Codeine     Immunization History  Administered Date(s) Administered   Influenza Split 09/05/2012, 09/27/2014   Influenza,inj,Quad PF,6+ Mos 07/08/2018    Past Medical History:  Diagnosis Date   Diabetes mellitus without complication ([place])    High cholesterol    Hypertension     Tobacco History: Social History   Tobacco  Use  Smoking Status Never  Smokeless Tobacco Never   Counseling given: Yes   Outpatient Medications Prior to Visit  Medication Sig Dispense Refill   albuterol (PROVENTIL HFA;VENTOLIN HFA) 108 (90 Base) MCG/ACT inhaler Inhale 2 puffs into the lungs every 4 (four) hours as needed for wheezing or shortness of breath. 1 Inhaler 0   atenolol (TENORMIN) 50 MG tablet Take 50 mg by mouth daily.     atorvastatin (LIPITOR) 20 MG tablet Take 20 mg by mouth daily.     benzonatate (TESSALON) 100 MG capsule Take 2 capsules (200 mg total) by mouth 3 (three)  times daily as needed for cough. 21 capsule 0   FARXIGA 10 MG TABS tablet Take 10 mg by mouth daily.     fluticasone (FLOVENT HFA) 110 MCG/ACT inhaler Inhale 2 puffs into the lungs 2 (two) times daily. 1 each 12   furosemide (LASIX) 20 MG tablet Take 1 tablet by mouth once daily 30 tablet 0   glyBURIDE (DIABETA) 5 MG tablet Take 10 mg by mouth 2 (two) times daily.     loratadine (CLARITIN) 10 MG tablet Take 10 mg by mouth daily.     losartan-hydrochlorothiazide (HYZAAR) 50-12.5 MG tablet Take 1 tablet by mouth daily.     pioglitazone (ACTOS) 45 MG tablet Take 45 mg by mouth daily.     No facility-administered medications prior to visit.     Review of Systems:   Constitutional:   No  weight loss, night sweats,  Fevers, chills, fatigue, or  lassitude.  HEENT:   No headaches,  Difficulty swallowing,  Tooth/dental problems, or  Sore throat,                No sneezing, itching, ear ache, nasal congestion, post nasal drip,   CV:  No chest pain,  Orthopnea, PND, swelling in lower extremities, anasarca, dizziness, palpitations, syncope.   GI  No heartburn, indigestion, abdominal pain, nausea, vomiting, diarrhea, change in bowel habits, loss of appetite, bloody stools.   Resp: No shortness of breath with exertion or at rest.  No excess mucus, no productive cough,  No non-productive cough,  No coughing up of blood.  No change in color of mucus.  No wheezing.  No chest wall deformity  Skin: no rash or lesions.  GU: no dysuria, change in color of urine, no urgency or frequency.  No flank pain, no hematuria   MS:  No joint pain or swelling.  No decreased range of motion.  No back pain.    Physical Exam  BP 128/84 (BP Location: Left Wrist, Cuff Size: Normal)   Pulse (!) 53   Temp 97.8 F (36.6 C) (Oral)   Ht $R'6\' 1"'yK$  (1.854 m)   Wt (!) 425 lb 6.4 oz (193 kg)   SpO2 99%   BMI 56.12 kg/m   GEN: A/Ox3; pleasant , NAD, well nourished    HEENT:  [place]/AT,  EACs-clear, TMs-wnl, NOSE-clear,  THROAT-clear, no lesions, no postnasal drip or exudate noted.   NECK:  Supple w/ fair ROM; no JVD; normal carotid impulses w/o bruits; no thyromegaly or nodules palpated; no lymphadenopathy.    RESP  Clear  P & A; w/o, wheezes/ rales/ or rhonchi. no accessory muscle use, no dullness to percussion  CARD:  RRR, no m/r/g, no peripheral edema, pulses intact, no cyanosis or clubbing.  GI:   Soft & nt; nml bowel sounds; no organomegaly or masses detected.   Musco: Warm bil, no deformities or joint swelling  noted.   Neuro: alert, no focal deficits noted.    Skin: Warm, no lesions or rashes    Lab Results:  CBC    Component Value Date/Time   WBC 9.0 11/05/2008 0446   RBC 4.10 (L) 11/05/2008 0446   HGB 12.1 (L) 11/05/2008 0446   HCT 35.3 (L) 11/05/2008 0446   PLT 284 11/05/2008 0446   MCV 86.1 11/05/2008 0446   MCHC 34.2 11/05/2008 0446   RDW 14.8 11/05/2008 0446   LYMPHSABS 1.3 11/01/2008 1855   MONOABS 0.8 11/01/2008 1855   EOSABS 0.0 11/01/2008 1855   BASOSABS 0.0 11/01/2008 1855    BMET    Component Value Date/Time   NA 136 12/29/2020 0959   K 4.2 12/29/2020 0959   CL 99 12/29/2020 0959   CO2 30 12/29/2020 0959   GLUCOSE 222 (H) 12/29/2020 0959   BUN 28 (H) 12/29/2020 0959   CREATININE 1.00 12/29/2020 0959   CALCIUM 9.7 12/29/2020 0959   GFRNONAA >60 11/06/2008 0520   GFRAA  11/06/2008 0520    >60        The eGFR has been calculated using the MDRD equation. This calculation has not been validated in all clinical situations. eGFR's persistently <60 mL/min signify possible Chronic Kidney Disease.    BNP No results found for: BNP  ProBNP    Component Value Date/Time   PROBNP 14.0 12/03/2020 1006    Imaging: ECHOCARDIOGRAM COMPLETE  Result Date: 05/19/2021    ECHOCARDIOGRAM REPORT   Patient Name:   Philip [name] Date of Exam: 05/19/2021 Medical Rec #:  [id number]     Height:       73.0 in Accession #:    [id number]    Weight:       418.6 lb Date of Birth:   07/18/1966      BSA:          2.945 m Patient Age:    27 years      BP:           187/99 mmHg Patient Gender: M             HR:           66 bpm. Exam Location:  [facility] Procedure: 2D Echo, Cardiac Doppler, Color Doppler and Intracardiac            Opacification Agent Indications:    R06.00 SOB  History:        Patient has no prior history of Echocardiogram examinations.                 Risk Factors:Hypertension, HLD, Diabetes and Sleep Apnea. Hx of                 COVID 19.  Sonographer:    Marygrace Drought RCS Referring Phys: [id number] [place]  1. Left ventricular ejection fraction, by estimation, is 55 to 60%. The left ventricle has normal function. The left ventricle has no regional wall motion abnormalities. The left ventricular internal cavity size was mildly dilated. Left ventricular diastolic parameters were normal.  2. Right ventricular systolic function is normal. The right ventricular size is normal. Tricuspid regurgitation signal is inadequate for assessing PA pressure.  3. The mitral valve is grossly normal. No evidence of mitral valve regurgitation.  4. The aortic valve is tricuspid. Aortic valve regurgitation is not visualized. Mild aortic valve sclerosis is present, with no evidence of aortic valve stenosis. Comparison(s): No prior Echocardiogram. FINDINGS  Left  Ventricle: Left ventricular ejection fraction, by estimation, is 55 to 60%. The left ventricle has normal function. The left ventricle has no regional wall motion abnormalities. The left ventricular internal cavity size was mildly dilated. There is  no left ventricular hypertrophy. Left ventricular diastolic parameters were normal. Right Ventricle: The right ventricular size is normal. No increase in right ventricular wall thickness. Right ventricular systolic function is normal. Tricuspid regurgitation signal is inadequate for assessing PA pressure. Left Atrium: Left atrial size was normal in size. Right Atrium:  Right atrial size was normal in size. Pericardium: There is no evidence of pericardial effusion. Mitral Valve: The mitral valve is grossly normal. No evidence of mitral valve regurgitation. Tricuspid Valve: The tricuspid valve is not well visualized. Tricuspid valve regurgitation is not demonstrated. Aortic Valve: The aortic valve is tricuspid. Aortic valve regurgitation is not visualized. Mild aortic valve sclerosis is present, with no evidence of aortic valve stenosis. Pulmonic Valve: The pulmonic valve was grossly normal. Pulmonic valve regurgitation is not visualized. Aorta: The aortic root and ascending aorta are structurally normal, with no evidence of dilitation. IAS/Shunts: No atrial level shunt detected by color flow Doppler.  LEFT VENTRICLE PLAX 2D LVIDd:         5.90 cm  Diastology LVIDs:         3.50 cm  LV e' medial:    9.79 cm/s LV PW:         1.00 cm  LV E/e' medial:  12.5 LV IVS:        1.00 cm  LV e' lateral:   9.90 cm/s LVOT diam:     2.10 cm  LV E/e' lateral: 12.3 LV SV:         88 LV SV Index:   30 LVOT Area:     3.46 cm  RIGHT VENTRICLE RV Basal diam:  3.70 cm RV S prime:     13.30 cm/s LEFT ATRIUM             Index LA diam:        4.20 cm 1.43 cm/m LA Vol (A2C):   57.2 ml 19.41 ml/m LA Vol (A4C):   37.8 ml 12.82 ml/m LA Biplane Vol: 58.1 ml 19.73 ml/m  AORTIC VALVE LVOT Vmax:   108.00 cm/s LVOT Vmean:  69.200 cm/s LVOT VTI:    0.255 m  AORTA Ao Root diam: 2.30 cm MITRAL VALVE MV Area (PHT):              SHUNTS MV Decel Time:              Systemic VTI:  0.26 m MV E velocity: 122.00 cm/s  Systemic Diam: 2.10 cm MV A velocity: 94.30 cm/s MV E/A ratio:  1.29 Lyman Bishop MD Electronically signed by Lyman Bishop MD Signature Date/Time: 05/19/2021/10:16:46 AM    Final     perflutren lipid microspheres (DEFINITY) IV suspension     Date Action Dose Route User   05/19/2021 0815 Given 2 mL Intravenous (Left Arm) Trey Paula       PFT Results Latest Ref Rng & Units 02/26/2021  FVC-Pre L  2.06  FVC-Predicted Pre % 48  FVC-Post L 1.98  FVC-Predicted Post % 46  Pre FEV1/FVC % % 77  Post FEV1/FCV % % 79  FEV1-Pre L 1.58  FEV1-Predicted Pre % 46  FEV1-Post L 1.57  DLCO uncorrected ml/min/mmHg 15.00  DLCO UNC% % 50  DLCO corrected ml/min/mmHg 15.00  DLCO COR %Predicted % 50  DLVA Predicted % 115  TLC L 4.81  TLC % Predicted % 67  RV % Predicted % 111    No results found for: NITRICOXIDE      Assessment & Plan:   OSA (obstructive sleep apnea) Severe obstructive sleep apnea.  CPAP titration study showed optimal control on CPAP at 19 cm H2O.  Continue on CPAP at bedtime.  Patient education on sleep apnea.  Plan    Patient Instructions  Refer to Pulmonary Rehab.  Healthy weight loss, agree with healthy weight and wellness referral  Stop Flovent  Begin BREO 1 puff daily  Support stocking during daytime remove at bedtime .  Activity as tolerated.  Albuterol inhaler As needed   Continue on CPAP At bedtime   Do not drive if sleepy .  Follow with Dr. Erin Fulling in 2 months and As needed        Dyspnea Dyspnea with exertion suspect is multifactorial in nature with underlying severe morbid obesity, moderate to severe restrictive lung disease, possible component of reactive airways/asthma, severe physical deconditioning Cardiac work-up shows no evidence of congestive heart failure.  There was no mention of pulmonary hypertension on echo.  RV size was normal and RV systolic function was normal.   Check cbc   Plan Patient Instructions  Refer to Pulmonary Rehab.  Healthy weight loss, agree with healthy weight and wellness referral  Stop Flovent  Begin BREO 1 puff daily  Support stocking during daytime remove at bedtime .  Activity as tolerated.  Albuterol inhaler As needed   Continue on CPAP At bedtime   Do not drive if sleepy .  Follow with Dr. Erin Fulling in 2 months and As needed        Physical deconditioning Severe physical deconditioning.  Patient needs  referral to pulmonary rehab with underlying restrictive lung disease and possible asthma.  Morbid obesity due to excess calories ([name]) Current weight 425 pounds BMI 56.  Patient has underlying diabetes.  Agree with referral to healthy weight and wellness.  Healthy diet discussed with patient referral to pulmonary rehab for exercise  COVID-19 long hauler Patient continues to have long-haul COVID symptoms with activity intolerance shortness of breath and fatigue. Continue with supportive care.  Refer to pulmonary rehab.  Restrictive lung disease Patient is a never smoker.  PFTs show significant moderate to severe restriction.  May have a component of some underlying reactive airway/asthma since COVID-19 also morbid obesity with BMI 56 is also contributing factor. Patient has had some clinical benefit with Flovent.  We will try Breo to see if this gives him any improvement in clinical symptoms.  Plan  Patient Instructions  Refer to Pulmonary Rehab.  Healthy weight loss, agree with healthy weight and wellness referral  Stop Flovent  Begin BREO 1 puff daily  Support stocking during daytime remove at bedtime .  Activity as tolerated.  Albuterol inhaler As needed   Continue on CPAP At bedtime   Do not drive if sleepy .  Follow with Dr. Erin Fulling in 2 months and As needed         I spent   40 minutes dedicated to the care of this patient on the date of this encounter to include pre-visit review of records, face-to-face time with the patient discussing conditions above, post visit ordering of testing, clinical documentation with the electronic health record, making appropriate referrals as documented, and communicating necessary findings to members of the patients care team.   Rexene Edison, NP 05/26/2021

## 2021-05-27 ENCOUNTER — Encounter (HOSPITAL_COMMUNITY): Payer: Self-pay | Admitting: *Deleted

## 2021-05-27 NOTE — Progress Notes (Signed)
Received referral from [facility] for this pt to participate in pulmonary rehab with the diagnosis Primary dyspnea on exertion and Secondary Covid 19 unspecified. Philip [name] contracted Covid 19 in January 2022 and has persistent respiratory dysfunction for greater than 4 weeks.  This is well documented in his follow up appt with pulmonary and cardiology. Clinical review of pt follow up appt on 9/13 with Rubye Oaks NP Pulmonary office note.  Pt with Covid Risk Score - 2. Pt appropriate for scheduling for Pulmonary rehab.  Will forward to support staff for scheduling and verification of insurance eligibility/benefits with pt consent. Alanson Aly, BSN Cardiac and Emergency planning/management officer

## 2021-06-03 ENCOUNTER — Telehealth (HOSPITAL_COMMUNITY): Payer: Self-pay

## 2021-06-03 NOTE — Telephone Encounter (Signed)
Pt called wanting to schedule for pulmonary rehab, I advised pt that we have a backlog right now with pulmonary rehab 1-3 months. I advised pt that we will contact him at a later date to schedule.

## 2021-06-12 ENCOUNTER — Telehealth: Payer: Self-pay | Admitting: Adult Health

## 2021-06-12 NOTE — Telephone Encounter (Signed)
I have called and LM On VM for Philip [name] to return our call.

## 2021-06-18 NOTE — Telephone Encounter (Signed)
Lmtcb for [employer].

## 2021-06-22 NOTE — Telephone Encounter (Signed)
Called patient to see if he is interested in the Pulmonary Rehab Program. Patient expressed interest. Explained scheduling process and went over insurance, patient verbalized understanding. Also adv pt where we are with scheduling for PR and that we have a backlog of 1-4 months. 

## 2021-06-22 NOTE — Telephone Encounter (Signed)
Pt insurance is active and benefits verified through [facility]. Co-pay $40.00, DED $600.00/$600.00 met, out of pocket $3,000.00/$2,480.46 met, co-insurance 0%. No pre-authorization required. Jed/[employer], 06/18/21 @ 424PM, FFK#[id number]   2ndary insurance is active and benefits verified through [employer]. Co-pay $0.00, DED $3,000.00/$394.98 met, out of pocket $5,000.00/$394.98 met, co-insurance 20%. No pre-authorization required. Heather/[employer], 06/19/21 @ 923AM, OBT#[id number]    Will contact patient to see if he is interested in the Pulmonary Rehab Program.

## 2021-07-01 ENCOUNTER — Other Ambulatory Visit: Payer: Self-pay | Admitting: Pulmonary Disease

## 2021-07-20 ENCOUNTER — Encounter (HOSPITAL_COMMUNITY): Payer: Self-pay

## 2021-07-20 ENCOUNTER — Telehealth (HOSPITAL_COMMUNITY): Payer: Self-pay

## 2021-07-20 NOTE — Telephone Encounter (Signed)
Attempted to call patient in regards to Pulmonary Rehab - LM on VM Mailed letter 

## 2021-07-20 NOTE — Telephone Encounter (Signed)
Patient called back and was interested in participating in the Pulmonary Rehab Program. Patient will come in for orientation on 08/03/2021@10 :30am and will attend the 1:15pm exercise class.   Pensions consultant.

## 2021-07-29 ENCOUNTER — Encounter: Payer: Self-pay | Admitting: Pulmonary Disease

## 2021-07-30 ENCOUNTER — Telehealth (HOSPITAL_COMMUNITY): Payer: Self-pay

## 2021-07-30 NOTE — Telephone Encounter (Signed)
Called to make sure pt was coming to orientation on Monday at 10:30am. Pt is coming to orientation. Told pt that we would do a COVID screening and to wear closed toed shoes. Gave pt directions to the department and number.

## 2021-07-30 NOTE — Progress Notes (Signed)
Synopsis: Referred in March 2022 by Dr. Burton Apley for post-covid 19 symptoms  Subjective:   PATIENT ID: Philip [name] GENDER: male DOB: 1965-10-16, MRN: [id number]  HPI  Chief Complaint  Patient presents with   Follow-up    2 mo f/u for [name] and OSA. Will start pulmonary rehab on Monday. States he has been using his cpap machine.    Philip [name] is a 55 year old male, never smoker with diabetes obesity and hypertension who returns to pulmonary clinic for follow up of shortness of breath.   He was previously seen by Rubye Oaks, NP on 05/26/2021.  He has been started on CPAP therapy which download today shows an average use of 5 hours and 45 minutes/day with an AHI of 2 and no significant mask leak.  His set pressure is 19 cm of water.  He reports he is tolerating the CPAP well and has been sleeping better.  He continues to remain very short of breath especially with any exertional activity.  He continues to complain of cough, chest tightness and wheezing with exertion.  He is using Breo Ellipta daily and albuterol as needed.  He has been using albuterol more frequently with the weather change in the cold air.  He reports he will be starting pulmonary rehab in the near future.  Pulmonary function test from 02/27/2021 show mild restriction and moderate diffusion defect.  HRCT chest 03/05/2021 did not show any concerning signs for interstitial lung disease.  OV 12/03/20 He was sick with covid in January 2022. He was vaccinated but not boosted. He reports exertional dyspnea and dry cough since January. He also reports fatigue and brain fog. He denies wheezing. He is also having lower extremity edema which he was previously on lasix 20mg  daily. He does report occasional nightime awakenings with dyspnea and cough. He reports history of snoring.   He reports right sided chest pain that is worse with movement and coughing. The pain is not worse with eating.   Past Medical History:  Diagnosis  Date   Diabetes mellitus without complication ([name])    High cholesterol    Hypertension      Family History  Problem Relation Age of Onset   Heart disease Mother    Heart disease Father      Social History   Socioeconomic History   Marital status: Married    Spouse name: Not on file   Number of children: 1   Years of education: Not on file   Highest education level: Not on file  Occupational History   Not on file  Tobacco Use   Smoking status: Never   Smokeless tobacco: Never  Substance and Sexual Activity   Alcohol use: No   Drug use: No   Sexual activity: Not on file  Other Topics Concern   Not on file  Social History Narrative   Works at and [employer]   Social Determinants of [employer] Strain: Not on file  Food Insecurity: No Food Insecurity   Worried About Corporate investment banker in the Last Year: Never true   Programme researcher, broadcasting/film/video in the Last Year: Never true  Transportation Needs: No Transportation Needs   Lack of Transportation (Medical): No   Lack of Transportation (Non-Medical): No  Physical Activity: Not on file  Stress: Not on file  Social Connections: Not on file  Intimate Partner Violence: Not on file     Allergies  Allergen Reactions  Metformin Diarrhea   Codeine      Outpatient Medications Prior to Visit  Medication Sig Dispense Refill   atenolol (TENORMIN) 50 MG tablet Take 50 mg by mouth daily.     atorvastatin (LIPITOR) 20 MG tablet Take 20 mg by mouth daily.     benzonatate (TESSALON) 100 MG capsule Take 2 capsules (200 mg total) by mouth 3 (three) times daily as needed for cough. 21 capsule 0   FARXIGA 10 MG TABS tablet Take 10 mg by mouth daily.     glyBURIDE (DIABETA) 5 MG tablet Take 10 mg by mouth 2 (two) times daily.     loratadine (CLARITIN) 10 MG tablet Take 10 mg by mouth daily.     losartan-hydrochlorothiazide (HYZAAR) 50-12.5 MG tablet Take 1 tablet by mouth daily.     pioglitazone (ACTOS) 45 MG tablet Take 45  mg by mouth daily.     albuterol (PROVENTIL HFA;VENTOLIN HFA) 108 (90 Base) MCG/ACT inhaler Inhale 2 puffs into the lungs every 4 (four) hours as needed for wheezing or shortness of breath. 1 Inhaler 0   fluticasone (FLOVENT HFA) 110 MCG/ACT inhaler Inhale 2 puffs into the lungs 2 (two) times daily. 1 each 12   fluticasone furoate-vilanterol (BREO ELLIPTA) 100-25 MCG/INH AEPB Inhale 1 puff into the lungs daily. 1 each 5   fluticasone furoate-vilanterol (BREO ELLIPTA) 100-25 MCG/INH AEPB Inhale 1 puff into the lungs daily. 1 each 0   furosemide (LASIX) 20 MG tablet Take 1 tablet by mouth once daily 30 tablet 0   No facility-administered medications prior to visit.    Review of Systems  Constitutional:  Positive for malaise/fatigue. Negative for chills, fever and weight loss.  HENT:  Negative for congestion, sinus pain and sore throat.   Eyes: Negative.   Respiratory:  Positive for cough and shortness of breath. Negative for hemoptysis, sputum production and wheezing.   Cardiovascular:  Positive for leg swelling. Negative for chest pain, palpitations, orthopnea and claudication.  Gastrointestinal:  Negative for abdominal pain, heartburn, nausea and vomiting.  Genitourinary: Negative.   Musculoskeletal:  Negative for joint pain and myalgias.  Skin:  Negative for rash.  Neurological:  Negative for weakness.  Endo/Heme/Allergies: Negative.   Psychiatric/Behavioral: Negative.     Objective:   Vitals:   07/31/21 1200  BP: 130/84  Pulse: 68  SpO2: 95%  Weight: (!) 428 lb 9.6 oz (194.4 kg)  Height: 6\' 1"  (1.854 m)   Physical Exam Constitutional:      General: He is not in acute distress.    Appearance: He is obese. He is not ill-appearing.  HENT:     Head: Normocephalic and atraumatic.  Eyes:     Extraocular Movements: Extraocular movements intact.     Conjunctiva/sclera: Conjunctivae normal.     Pupils: Pupils are equal, round, and reactive to light.  Cardiovascular:     Rate and  Rhythm: Normal rate and regular rhythm.     Pulses: Normal pulses.     Heart sounds: Normal heart sounds. No murmur heard. Pulmonary:     Effort: Pulmonary effort is normal.     Breath sounds: Decreased air movement present. No wheezing, rhonchi or rales.  Abdominal:     General: Bowel sounds are normal.     Palpations: Abdomen is soft.  Musculoskeletal:     Right lower leg: No edema.     Left lower leg: No edema.  Skin:    General: Skin is warm and dry.  Neurological:  General: No focal deficit present.     Mental Status: He is alert.   CBC    Component Value Date/Time   WBC 8.3 05/26/2021 1056   RBC 4.41 05/26/2021 1056   HGB 12.1 (L) 05/26/2021 1056   HCT 38.1 (L) 05/26/2021 1056   PLT 299.0 05/26/2021 1056   MCV 86.3 05/26/2021 1056   MCHC 31.7 05/26/2021 1056   RDW 16.7 (H) 05/26/2021 1056   LYMPHSABS 1.7 05/26/2021 1056   MONOABS 0.6 05/26/2021 1056   EOSABS 0.2 05/26/2021 1056   BASOSABS 0.1 05/26/2021 1056   BMP Latest Ref Rng & Units 12/29/2020 12/03/2020 11/06/2008  Glucose 70 - 99 mg/dL 737(T) 062(I) 948(N)  BUN 6 - 23 mg/dL 46(E) 22 7  Creatinine 0.40 - 1.50 mg/dL 7.03 5.00 9.38  Sodium 135 - 145 mEq/L 136 136 136  Potassium 3.5 - 5.1 mEq/L 4.2 4.5 4.4  Chloride 96 - 112 mEq/L 99 98 97  CO2 19 - 32 mEq/L 30 30 31   Calcium 8.4 - 10.5 mg/dL 9.7 9.6 9.3   Chest imaging: HRCT Chest 03/05/21 no significant regions of ground-glass attenuation, septal thickening, subpleural reticulation, parenchymal banding, traction bronchiectasis or frank honeycombing to indicate interstitial lung disease. Inspiratory and expiratory imaging is unremarkable. No acute consolidative airspace disease. No pleural effusions.  CXR 10/23/20 Heart size is normal. Mediastinal shadows are normal. The lungs are clear. No infiltrate, collapse or effusion. No significant bone Finding.  PFT: PFT Results Latest Ref Rng & Units 02/26/2021  FVC-Pre L 2.06  FVC-Predicted Pre % 48  FVC-Post L  1.98  FVC-Predicted Post % 46  Pre FEV1/FVC % % 77  Post FEV1/FCV % % 79  FEV1-Pre L 1.58  FEV1-Predicted Pre % 46  FEV1-Post L 1.57  DLCO uncorrected ml/min/mmHg 15.00  DLCO UNC% % 50  DLCO corrected ml/min/mmHg 15.00  DLCO COR %Predicted % 50  DLVA Predicted % 115  TLC L 4.81  TLC % Predicted % 67  RV % Predicted % 111    Assessment & Plan:   OSA (obstructive sleep apnea)  Morbid obesity due to excess calories ([name]) - Plan: Amb Ref to Medical Weight Management  Restrictive lung disease  Moderate persistent asthma without complication - Plan: Fluticasone-Umeclidin-Vilant (TRELEGY ELLIPTA) 100-62.5-25 MCG/ACT AEPB, albuterol (VENTOLIN HFA) 108 (90 Base) MCG/ACT inhaler  Edema, unspecified type - Plan: furosemide (LASIX) 20 MG tablet  Discussion: Rien Patrice is a 55 year old male, never smoker with diabetes obesity and hypertension who returns to pulmonary clinic for follow up of shortness of breath.   His shortness of breath is secondary to restrictive lung disease in the setting of morbid obesity.  He does not have any interstitial or airway findings on his high-resolution CT chest scan.  He does have clinical symptoms concerning for reactive airways disease given intermittent wheezing, cough and chest tightness.  We will transition him from [facility] to Trelegy Ellipta and monitor for any benefit in his breathing symptoms.  He will be starting pulmonary rehab in the near future.  I believe this will be of most benefit to him in order to increase his physical activity to help with his weight loss.  We will also refer him to the medical weight loss clinic.  He is to continue CPAP therapy.  Most recent data download shows good compliance.  He can continue on Lasix 20 mg daily for lower extremity edema.  Follow up in 3 months.   Melody Comas, MD [facility] Office: [phone]   Current Outpatient Medications:    atenolol (TENORMIN) 50 MG  tablet, Take 50 mg by mouth daily., Disp: , Rfl:    atorvastatin (LIPITOR) 20 MG tablet, Take 20 mg by mouth daily., Disp: , Rfl:    benzonatate (TESSALON) 100 MG capsule, Take 2 capsules (200 mg total) by mouth 3 (three) times daily as needed for cough., Disp: 21 capsule, Rfl: 0   FARXIGA 10 MG TABS tablet, Take 10 mg by mouth daily., Disp: , Rfl:    Fluticasone-Umeclidin-Vilant (TRELEGY ELLIPTA) 100-62.5-25 MCG/ACT AEPB, Inhale 1 puff into the lungs daily., Disp: 28 each, Rfl: 6   glyBURIDE (DIABETA) 5 MG tablet, Take 10 mg by mouth 2 (two) times daily., Disp: , Rfl:    loratadine (CLARITIN) 10 MG tablet, Take 10 mg by mouth daily., Disp: , Rfl:    losartan-hydrochlorothiazide (HYZAAR) 50-12.5 MG tablet, Take 1 tablet by mouth daily., Disp: , Rfl:    pioglitazone (ACTOS) 45 MG tablet, Take 45 mg by mouth daily., Disp: , Rfl:    albuterol (VENTOLIN HFA) 108 (90 Base) MCG/ACT inhaler, Inhale 2 puffs into the lungs every 4 (four) hours as needed for wheezing or shortness of breath., Disp: 1 each, Rfl: 0   furosemide (LASIX) 20 MG tablet, Take 1 tablet (20 mg total) by mouth daily., Disp: 30 tablet, Rfl: 6

## 2021-07-31 ENCOUNTER — Ambulatory Visit (INDEPENDENT_AMBULATORY_CARE_PROVIDER_SITE_OTHER): Payer: 59 | Admitting: Pulmonary Disease

## 2021-07-31 ENCOUNTER — Telehealth: Payer: Self-pay | Admitting: Pulmonary Disease

## 2021-07-31 ENCOUNTER — Other Ambulatory Visit: Payer: Self-pay

## 2021-07-31 ENCOUNTER — Encounter: Payer: Self-pay | Admitting: Pulmonary Disease

## 2021-07-31 VITALS — BP 130/84 | HR 68 | Ht 73.0 in | Wt >= 6400 oz

## 2021-07-31 DIAGNOSIS — J4540 Moderate persistent asthma, uncomplicated: Secondary | ICD-10-CM

## 2021-07-31 DIAGNOSIS — R609 Edema, unspecified: Secondary | ICD-10-CM

## 2021-07-31 DIAGNOSIS — G4733 Obstructive sleep apnea (adult) (pediatric): Secondary | ICD-10-CM | POA: Diagnosis not present

## 2021-07-31 DIAGNOSIS — J984 Other disorders of lung: Secondary | ICD-10-CM | POA: Diagnosis not present

## 2021-07-31 MED ORDER — FUROSEMIDE 20 MG PO TABS: 20.0000 mg | ORAL_TABLET | Freq: Every day | ORAL | 6 refills | Status: DC

## 2021-07-31 MED ORDER — TRELEGY ELLIPTA 100-62.5-25 MCG/ACT IN AEPB: 1.0000 | INHALATION_SPRAY | Freq: Every day | RESPIRATORY_TRACT | 6 refills | Status: DC

## 2021-07-31 MED ORDER — ALBUTEROL SULFATE HFA 108 (90 BASE) MCG/ACT IN AERS: 2.0000 | INHALATION_SPRAY | RESPIRATORY_TRACT | 0 refills | Status: DC | PRN

## 2021-07-31 NOTE — Patient Instructions (Signed)
Start trelegy ellipta 1 puff daily - rinse mouth out after each use  Continue to use albuterol as needed  Stop using breo while on trelegy  Good luck with pulmonary rehab!  We will check on your referral to medical weight loss clinic  Continue using your CPAP machine each night

## 2021-07-31 NOTE — Telephone Encounter (Signed)
During recent appointment patient dropped off Disability Statement for [facility].  Emailed form to [employer] for completion.

## 2021-08-02 NOTE — Progress Notes (Signed)
Cardiology Office Note:   Date:  08/03/2021  NAME:  Philip [name]    MRN: [id number] DOB:  31-May-1966   PCP:  Burton Apley, MD  Cardiologist:  None  Electrophysiologist:  None   Referring MD: Burton Apley, MD   Chief Complaint  Patient presents with   Shortness of Breath        History of Present Illness:   Philip [name] is a 55 y.o. male with a hx of morbid obesity, severe OSA, obesity hypoventilation syndrome who presents for follow-up. Seen in August for SOB. Echo normal. PFTs show restrictive pattern.  He overall appears to be doing well.  Blood pressure better controlled at home.  Slightly elevated today.  Denies any significant chest pain.  He does get short of breath with activity.  He is awaiting to undergo pulmonary rehabilitation evaluation.  We also referred him to healthy weight and wellness to discuss likely bariatric surgery.  His edema is controlled with Lasix.  Weights are stable.  He needs to start working on diet and exercising the best he can.  He does have plans to start this.  Overall we discussed the results of his echocardiogram which showed normal LV function.  He has normal diastolic parameters.  Overall he does not have findings consistent with congestive heart failure.  His lower extremity edema is related to venous insufficiency.  This will improve with activity.  For now can continue Lasix.  Problem List DM HTN HLD Severe OSA Morbid Obesity -BMI 58 6.  Restrictive lung disease  -2/2 morbid obesity 7. Venous insufficiency   Past Medical History: Past Medical History:  Diagnosis Date   Diabetes mellitus without complication ([name])    High cholesterol    Hypertension     Past Surgical History: Past Surgical History:  Procedure Laterality Date   CARPAL TUNNEL RELEASE     KNEE SURGERY      Current Medications: Current Meds  Medication Sig   albuterol (VENTOLIN HFA) 108 (90 Base) MCG/ACT inhaler Inhale 2 puffs into the lungs every 4 (four)  hours as needed for wheezing or shortness of breath.   atenolol (TENORMIN) 50 MG tablet Take 50 mg by mouth daily.   atorvastatin (LIPITOR) 20 MG tablet Take 20 mg by mouth daily.   FARXIGA 10 MG TABS tablet Take 10 mg by mouth daily.   Fluticasone-Umeclidin-Vilant (TRELEGY ELLIPTA) 100-62.5-25 MCG/ACT AEPB Inhale 1 puff into the lungs daily.   furosemide (LASIX) 20 MG tablet Take 1 tablet (20 mg total) by mouth daily.   glyBURIDE (DIABETA) 5 MG tablet Take 10 mg by mouth 2 (two) times daily.   loratadine (CLARITIN) 10 MG tablet Take 10 mg by mouth daily.   losartan-hydrochlorothiazide (HYZAAR) 50-12.5 MG tablet Take 1 tablet by mouth daily.   pioglitazone (ACTOS) 45 MG tablet Take 45 mg by mouth daily.   [DISCONTINUED] benzonatate (TESSALON) 100 MG capsule Take 2 capsules (200 mg total) by mouth 3 (three) times daily as needed for cough.     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  ROS:   All other ROS reviewed and negative. Pertinent positives noted in the HPI.     EKGs/Labs/Other Studies Reviewed:   The following studies were personally reviewed by me today:   TTE 05/19/2021  1. Left ventricular ejection fraction, by estimation, is 55 to 60%. The  left ventricle has normal function. The left ventricle has no regional  wall motion abnormalities. The left ventricular internal cavity size was  mildly dilated. Left ventricular  diastolic parameters were normal.   2. Right ventricular systolic function is normal. The right ventricular  size is normal. Tricuspid regurgitation signal is inadequate for assessing  PA pressure.   3. The mitral valve is grossly normal. No evidence of mitral valve  regurgitation.   4. The aortic valve is tricuspid. Aortic valve regurgitation is not  visualized. Mild aortic valve sclerosis is present, with no evidence of  aortic valve stenosis.   Recent Labs: 12/03/2020: ALT 11; Pro B Natriuretic peptide (BNP) 14.0 12/29/2020: BUN 28; Creatinine, Ser 1.00; Potassium 4.2; Sodium 136 05/26/2021: Hemoglobin 12.1; Platelets 299.0   Recent Lipid Panel No results found for: CHOL, TRIG, HDL, CHOLHDL, VLDL, LDLCALC, LDLDIRECT  Physical Exam:   VS:  BP (!) 152/92   Pulse 65   Ht 6\' 1"  (1.854 m)   Wt (!) 421 lb 12.8 oz (191.3 kg)   SpO2 98%   BMI 55.65 kg/m    Wt Readings from Last 3 Encounters:  08/03/21 (!) 421 lb 12.8 oz (191.3 kg)  07/31/21 (!) 428 lb 9.6 oz (194.4 kg)  05/26/21 (!) 425 lb 6.4 oz (193 kg)    General: Well nourished, well developed, in no acute distress Head: Atraumatic, normal size  Eyes: PEERLA, EOMI  Neck: Supple, no JVD Endocrine: No thryomegaly Cardiac: Normal S1, S2; RRR; no murmurs, rubs, or gallops Lungs: Clear to auscultation bilaterally, no wheezing, rhonchi or rales  Abd: Soft, nontender, no hepatomegaly  Ext: No edema, pulses 2+ Musculoskeletal: No deformities, BUE and BLE  strength normal and equal Skin: Warm and dry, no rashes   Neuro: Alert and oriented to person, place, time, and situation, CNII-XII grossly intact, no focal deficits  Psych: Normal mood and affect   ASSESSMENT:   Philip [name] is a 55 y.o. male who presents for the following: 1. SOB (shortness of breath) on exertion   2. Bilateral lower extremity edema   3. Physical deconditioning     PLAN:   1. SOB (shortness of breath) on exertion -Most recent echocardiogram is normal.  BNP is normal.  He has normal diastolic parameters.  He has normal LV function.  I do not believe his lower extremity edema or shortness of breath are related to  congestive heart failure.  I believe this is a combination of restrictive lung disease in the setting of morbid obesity as well as physical deconditioning.  I recommended regular exercise.  I do not believe he needs a stress test.  Would recommend just continue with regular exercise as well as following up with pulmonary.  2. Bilateral lower extremity edema -Continue Lasix 20 mg daily as needed.  Symptoms are related to venous insufficiency.  No evidence of congestive heart failure on recent echo.  Diastolic parameters are normal.  3. Physical deconditioning -Exercise encouraged.  Disposition: Return if symptoms worsen or fail to improve.  Medication Adjustments/Labs and Tests Ordered: Current medicines are reviewed at length with the patient today.  Concerns regarding medicines are outlined above.  No orders of the defined types were placed in this encounter.  No orders of the defined types were placed in this encounter.   Patient Instructions  Medication Instructions:  The current medical regimen is effective;  continue present plan and medications.  *If you need a refill on your cardiac medications before your next appointment, please call your pharmacy*   Follow-Up: At [facility], you and your health needs are our priority.  As part of our  continuing mission to provide you with exceptional heart care, we have created designated Provider Care Teams.  These Care Teams include your primary Cardiologist (physician) and Advanced Practice Providers (APPs -  Physician Assistants and Nurse Practitioners) who all work together to provide you with the care you need, when you need it.  We recommend signing up for the patient portal called "MyChart".  Sign up information is provided on this After Visit Summary.  MyChart is used to connect with patients for Virtual Visits (Telemedicine).  Patients are able to view lab/test results, encounter notes, upcoming appointments, etc.  Non-urgent messages can be sent to your provider as well.   To learn more about what you can do with MyChart, go to [email or web address].    Your next appointment:   As needed  The format for your next appointment:   In Person  Provider:   Lennie Odor, MD      Time Spent with Patient: I have spent a total of 25 minutes with patient reviewing hospital notes, telemetry, EKGs, labs and examining the patient as well as establishing an assessment and plan that was discussed with the patient.  > 50% of time was spent in direct patient care.  Signed, Lenna Gilford. Flora Lipps, MD, [facility]  [facility]  [address], [address] [place], [place] [address] [phone]  08/03/2021 10:28 AM

## 2021-08-03 ENCOUNTER — Encounter (HOSPITAL_COMMUNITY)
Admission: RE | Admit: 2021-08-03 | Discharge: 2021-08-03 | Disposition: A | Discharge status: Home / Self Care | Payer: 59 | Source: Ambulatory Visit | Attending: Pulmonary Disease | Admitting: Pulmonary Disease

## 2021-08-03 ENCOUNTER — Encounter: Payer: Self-pay | Admitting: Cardiovascular Disease

## 2021-08-03 ENCOUNTER — Other Ambulatory Visit: Payer: Self-pay

## 2021-08-03 ENCOUNTER — Telehealth: Payer: Self-pay | Admitting: Pulmonary Disease

## 2021-08-03 ENCOUNTER — Encounter (HOSPITAL_COMMUNITY): Payer: Self-pay

## 2021-08-03 ENCOUNTER — Ambulatory Visit (INDEPENDENT_AMBULATORY_CARE_PROVIDER_SITE_OTHER): Payer: 59 | Admitting: Cardiovascular Disease

## 2021-08-03 VITALS — BP 145/84 | HR 51 | Ht 73.0 in | Wt >= 6400 oz

## 2021-08-03 VITALS — BP 152/92 | HR 65 | Ht 73.0 in | Wt >= 6400 oz

## 2021-08-03 DIAGNOSIS — R0602 Shortness of breath: Secondary | ICD-10-CM

## 2021-08-03 DIAGNOSIS — R600 Localized edema: Secondary | ICD-10-CM | POA: Diagnosis not present

## 2021-08-03 DIAGNOSIS — R0609 Other forms of dyspnea: Secondary | ICD-10-CM | POA: Insufficient documentation

## 2021-08-03 DIAGNOSIS — R5381 Other malaise: Secondary | ICD-10-CM

## 2021-08-03 DIAGNOSIS — U099 Post covid-19 condition, unspecified: Secondary | ICD-10-CM | POA: Diagnosis present

## 2021-08-03 NOTE — Progress Notes (Signed)
Pulmonary Individual Treatment Plan  Patient Details  Name: Philip [name] MRN: [id number] Date of Birth: 14-Feb-1966 Referring Provider:   Doristine Devoid Pulmonary Rehab Walk Test from 08/03/2021 in [facility] CARDI[facility]  Referring Provider [name]       Initial Encounter Date:  Flowsheet Row Pulmonary Rehab Walk Test from 08/03/2021 in [facility] CARDIAC REHAB  Date 08/03/21       Visit Diagnosis: Dyspnea on exertion  Post covid-19 condition, unspecified  Patient's Home Medications on Admission:   Current Outpatient Medications:    albuterol (VENTOLIN HFA) 108 (90 Base) MCG/ACT inhaler, Inhale 2 puffs into the lungs every 4 (four) hours as needed for wheezing or shortness of breath., Disp: 1 each, Rfl: 0   atenolol (TENORMIN) 50 MG tablet, Take 50 mg by mouth daily., Disp: , Rfl:    atorvastatin (LIPITOR) 20 MG tablet, Take 20 mg by mouth daily., Disp: , Rfl:    FARXIGA 10 MG TABS tablet, Take 10 mg by mouth daily., Disp: , Rfl:    Fluticasone-Umeclidin-Vilant (TRELEGY ELLIPTA) 100-62.5-25 MCG/ACT AEPB, Inhale 1 puff into the lungs daily., Disp: 28 each, Rfl: 6   furosemide (LASIX) 20 MG tablet, Take 1 tablet (20 mg total) by mouth daily., Disp: 30 tablet, Rfl: 6   glyBURIDE (DIABETA) 5 MG tablet, Take 10 mg by mouth 2 (two) times daily., Disp: , Rfl:    loratadine (CLARITIN) 10 MG tablet, Take 10 mg by mouth daily., Disp: , Rfl:    losartan-hydrochlorothiazide (HYZAAR) 50-12.5 MG tablet, Take 1 tablet by mouth daily., Disp: , Rfl:    pioglitazone (ACTOS) 45 MG tablet, Take 45 mg by mouth daily., Disp: , Rfl:   Past Medical History: Past Medical History:  Diagnosis Date   Diabetes mellitus without complication ([name])    High cholesterol    Hypertension     Tobacco Use: Social History   Tobacco Use  Smoking Status Never  Smokeless Tobacco Never    Labs: Recent Review Flowsheet Data     Labs for ITP Cardiac and Pulmonary Rehab  Latest Ref Rng & Units 11/01/2008   TCO2 0 - 100 mmol/L 24       Capillary Blood Glucose: Lab Results  Component Value Date   GLUCAP 195 (H) 11/08/2008   GLUCAP 223 (H) 11/08/2008   GLUCAP 134 (H) 11/07/2008   GLUCAP 142 (H) 11/07/2008   GLUCAP 193 (H) 11/07/2008     Pulmonary Assessment Scores:  Pulmonary Assessment Scores     Row Name 08/03/21 1128         ADL UCSD   ADL Phase Entry     SOB Score total 85       CAT Score   CAT Score 29       mMRC Score   mMRC Score 4             UCSD: Self-administered rating of dyspnea associated with activities of daily living (ADLs) 6-point scale (0 = "not at all" to 5 = "maximal or unable to do because of breathlessness")  Scoring Scores range from 0 to 120.  Minimally important difference is 5 units  CAT: CAT can identify the health impairment of COPD patients and is better correlated with disease progression.  CAT has a scoring range of zero to 40. The CAT score is classified into four groups of low (less than 10), medium (10 - 20), high (21-30) and very high (31-40) based on the impact level of  disease on health status. A CAT score over 10 suggests significant symptoms.  A worsening CAT score could be explained by an exacerbation, poor medication adherence, poor inhaler technique, or progression of COPD or comorbid conditions.  CAT MCID is 2 points  mMRC: mMRC (Modified Medical Research Council) Dyspnea Scale is used to assess the degree of baseline functional disability in patients of respiratory disease due to dyspnea. No minimal important difference is established. A decrease in score of 1 point or greater is considered a positive change.   Pulmonary Function Assessment:  Pulmonary Function Assessment - 08/03/21 1143       Breath   Bilateral Breath Sounds Clear    Shortness of Breath Yes;Limiting activity             Exercise Target Goals: Exercise Program Goal: Individual exercise prescription set using  results from initial 6 min walk test and THRR while considering  patient's activity barriers and safety.   Exercise Prescription Goal: Initial exercise prescription builds to 30-45 minutes a day of aerobic activity, 2-3 days per week.  Home exercise guidelines will be given to patient during program as part of exercise prescription that the participant will acknowledge.  Activity Barriers & Risk Stratification:  Activity Barriers & Cardiac Risk Stratification - 08/03/21 1118       Activity Barriers & Cardiac Risk Stratification   Activity Barriers Shortness of Breath;Deconditioning   knee surgery   Cardiac Risk Stratification Low             6 Minute Walk:  6 Minute Walk     Row Name 08/03/21 1337         6 Minute Walk   Phase Initial     Distance 340 feet     Walk Time 6 minutes     # of Rest Breaks 3  0:58-1:34, 2:45-3:18, 4:14-4:37     MPH 0.64     METS 0.1     RPE 14     Perceived Dyspnea  3.5     VO2 Peak 0.36     Symptoms No     Resting HR 51 bpm     Resting BP 145/84     Resting Oxygen Saturation  98 %     Exercise Oxygen Saturation  during 6 min walk 95 %     Max Ex. HR 67 bpm     Max Ex. BP 164/92     2 Minute Post BP 135/74       Interval HR   1 Minute HR 63     2 Minute HR 61     3 Minute HR 62     4 Minute HR 59     5 Minute HR 63     6 Minute HR 67     2 Minute Post HR 64     Interval Heart Rate? Yes       Interval Oxygen   Interval Oxygen? Yes     Baseline Oxygen Saturation % 98 %     1 Minute Oxygen Saturation % 96 %     1 Minute Liters of Oxygen 0 L     2 Minute Oxygen Saturation % 95 %     2 Minute Liters of Oxygen 0 L     3 Minute Oxygen Saturation % 96 %     3 Minute Liters of Oxygen 0 L     4 Minute Oxygen Saturation % 95 %     4  Minute Liters of Oxygen 0 L     5 Minute Oxygen Saturation % 98 %     5 Minute Liters of Oxygen 0 L     6 Minute Oxygen Saturation % 96 %     6 Minute Liters of Oxygen 0 L     2 Minute Post Oxygen  Saturation % 95 %     2 Minute Post Liters of Oxygen 0 L              Oxygen Initial Assessment:  Oxygen Initial Assessment - 08/03/21 1120       Home Oxygen   Home Oxygen Device None    Sleep Oxygen Prescription CPAP    Home Exercise Oxygen Prescription None    Home Resting Oxygen Prescription None    Compliance with Home Oxygen Use Yes      Initial 6 min Walk   Oxygen Used None      Program Oxygen Prescription   Program Oxygen Prescription None      Intervention   Short Term Goals To learn and exhibit compliance with exercise, home and travel O2 prescription;To learn and understand importance of monitoring SPO2 with pulse oximeter and demonstrate accurate use of the pulse oximeter.;To learn and understand importance of maintaining oxygen saturations>88%;To learn and demonstrate proper pursed lip breathing techniques or other breathing techniques. ;To learn and demonstrate proper use of respiratory medications    Long  Term Goals Exhibits compliance with exercise, home  and travel O2 prescription;Verbalizes importance of monitoring SPO2 with pulse oximeter and return demonstration;Maintenance of O2 saturations>88%;Exhibits proper breathing techniques, such as pursed lip breathing or other method taught during program session;Compliance with respiratory medication;Demonstrates proper use of MDI's             Oxygen Re-Evaluation:   Oxygen Discharge (Final Oxygen Re-Evaluation):   Initial Exercise Prescription:  Initial Exercise Prescription - 08/03/21 1300       Date of Initial Exercise RX and Referring Provider   Date 08/03/21    Referring Provider [name]    Expected Discharge Date 10/08/21      NuStep   Level 1    SPM 40    Minutes 20      Prescription Details   Frequency (times per week) 2    Duration Progress to 30 minutes of continuous aerobic without signs/symptoms of physical distress      Intensity   THRR 40-80% of Max Heartrate 66-133    Ratings  of Perceived Exertion 11-13    Perceived Dyspnea 0-4      Progression   Progression Continue to progress workloads to maintain intensity without signs/symptoms of physical distress.      Resistance Training   Training Prescription Yes    Weight Red bands    Reps 10-15             Perform Capillary Blood Glucose checks as needed.  Exercise Prescription Changes:   Exercise Comments:   Exercise Goals and Review:   Exercise Goals     Row Name 08/03/21 1142             Exercise Goals   Increase Physical Activity Yes       Intervention Provide advice, education, support and counseling about physical activity/exercise needs.;Develop an individualized exercise prescription for aerobic and resistive training based on initial evaluation findings, risk stratification, comorbidities and participant's personal goals.       Expected Outcomes Short Term: Attend rehab on a regular basis  to increase amount of physical activity.;Long Term: Exercising regularly at least 3-5 days a week.;Long Term: Add in home exercise to make exercise part of routine and to increase amount of physical activity.       Increase Strength and Stamina Yes       Intervention Provide advice, education, support and counseling about physical activity/exercise needs.;Develop an individualized exercise prescription for aerobic and resistive training based on initial evaluation findings, risk stratification, comorbidities and participant's personal goals.       Expected Outcomes Short Term: Increase workloads from initial exercise prescription for resistance, speed, and METs.;Short Term: Perform resistance training exercises routinely during rehab and add in resistance training at home;Long Term: Improve cardiorespiratory fitness, muscular endurance and strength as measured by increased METs and functional capacity ( )       Able to understand and use rate of perceived exertion (RPE) scale Yes       Intervention  Provide education and explanation on how to use RPE scale       Expected Outcomes Short Term: Able to use RPE daily in rehab to express subjective intensity level;Long Term:  Able to use RPE to guide intensity level when exercising independently       Able to understand and use Dyspnea scale Yes       Intervention Provide education and explanation on how to use Dyspnea scale       Expected Outcomes Short Term: Able to use Dyspnea scale daily in rehab to express subjective sense of shortness of breath during exertion;Long Term: Able to use Dyspnea scale to guide intensity level when exercising independently       Knowledge and understanding of Target Heart Rate Range (THRR) Yes       Intervention Provide education and explanation of THRR including how the numbers were predicted and where they are located for reference       Expected Outcomes Short Term: Able to state/look up THRR;Short Term: Able to use daily as guideline for intensity in rehab;Long Term: Able to use THRR to govern intensity when exercising independently       Understanding of Exercise Prescription Yes       Intervention Provide education, explanation, and written materials on patient's individual exercise prescription       Expected Outcomes Short Term: Able to explain program exercise prescription;Long Term: Able to explain home exercise prescription to exercise independently                Exercise Goals Re-Evaluation :   Discharge Exercise Prescription (Final Exercise Prescription Changes):   Nutrition:  Target Goals: Understanding of nutrition guidelines, daily intake of sodium 1500mg , cholesterol 200mg , calories 30% from fat and 7% or less from saturated fats, daily to have 5 or more servings of fruits and vegetables.  Biometrics:  Pre Biometrics - 08/03/21 1058       Pre Biometrics   Grip Strength 29 kg              Nutrition Therapy Plan and Nutrition Goals:   Nutrition Assessments:  MEDIFICTS  Score Key: ?70 Need to make dietary changes  40-70 Heart Healthy Diet ? 40 Therapeutic Level Cholesterol Diet   Picture Your Plate Scores: <16 Unhealthy dietary pattern with much room for improvement. 41-50 Dietary pattern unlikely to meet recommendations for good health and room for improvement. 51-60 More healthful dietary pattern, with some room for improvement.  >60 Healthy dietary pattern, although there may be some specific behaviors that could be  improved.    Nutrition Goals Re-Evaluation:   Nutrition Goals Discharge (Final Nutrition Goals Re-Evaluation):   Psychosocial: Target Goals: Acknowledge presence or absence of significant depression and/or stress, maximize coping skills, provide positive support system. Participant is able to verbalize types and ability to use techniques and skills needed for reducing stress and depression.  Initial Review & Psychosocial Screening:  Initial Psych Review & Screening - 08/03/21 1125       Initial Review   Current issues with None Identified      Family Dynamics   Good Support System? Yes    Comments wife, 1 child, 3 grandchildren      Barriers   Psychosocial barriers to participate in program There are no identifiable barriers or psychosocial needs.      Screening Interventions   Interventions Encouraged to exercise             Quality of Life Scores:  Scores of 19 and below usually indicate a poorer quality of life in these areas.  A difference of  2-3 points is a clinically meaningful difference.  A difference of 2-3 points in the total score of the Quality of Life Index has been associated with significant improvement in overall quality of life, self-image, physical symptoms, and general health in studies assessing change in quality of life.  PHQ-9: Recent Review Flowsheet Data     Depression screen [facility] 2/9 08/03/2021   Decreased Interest 0   Down, Depressed, Hopeless 0   PHQ - 2 Score 0   Altered sleeping 0    Tired, decreased energy 0   Change in appetite 0   Feeling bad or failure about yourself  0   Trouble concentrating 0   Moving slowly or fidgety/restless 0   Suicidal thoughts 0   PHQ-9 Score 0   Difficult doing work/chores Not difficult at all      Interpretation of Total Score  Total Score Depression Severity:  1-4 = Minimal depression, 5-9 = Mild depression, 10-14 = Moderate depression, 15-19 = Moderately severe depression, 20-27 = Severe depression   Psychosocial Evaluation and Intervention:   Psychosocial Re-Evaluation:   Psychosocial Discharge (Final Psychosocial Re-Evaluation):   Education: Education Goals: Education classes will be provided on a weekly basis, covering required topics. Participant will state understanding/return demonstration of topics presented.  Learning Barriers/Preferences:  Learning Barriers/Preferences - 08/03/21 1131       Learning Barriers/Preferences   Learning Barriers Sight   wears glasses   Learning Preferences Audio;Computer/Internet;Group Instruction;Individual Instruction;Pictoral;Skilled Demonstration;Verbal Instruction;Video;Written Material             Education Topics: Risk Factor Reduction:  -Group instruction that is supported by a PowerPoint presentation. Instructor discusses the definition of a risk factor, different risk factors for pulmonary disease, and how the heart and lungs work together.     Nutrition for Pulmonary Patient:  -Group instruction provided by PowerPoint slides, verbal discussion, and written materials to support subject matter. The instructor gives an explanation and review of healthy diet recommendations, which includes a discussion on weight management, recommendations for fruit and vegetable consumption, as well as protein, fluid, caffeine, fiber, sodium, sugar, and alcohol. Tips for eating when patients are short of breath are discussed.   Pursed Lip Breathing:  -Group instruction that is  supported by demonstration and informational handouts. Instructor discusses the benefits of pursed lip and diaphragmatic breathing and detailed demonstration on how to preform both.     Oxygen Safety:  -Group instruction provided by PowerPoint,  verbal discussion, and written material to support subject matter. There is an overview of "What is Oxygen" and "Why do we need it".  Instructor also reviews how to create a safe environment for oxygen use, the importance of using oxygen as prescribed, and the risks of noncompliance. There is a brief discussion on traveling with oxygen and resources the patient may utilize.   Oxygen Equipment:  -Group instruction provided by [facility] Staff utilizing handouts, written materials, and equipment demonstrations.   Signs and Symptoms:  -Group instruction provided by written material and verbal discussion to support subject matter. Warning signs and symptoms of infection, stroke, and heart attack are reviewed and when to call the physician/911 reinforced. Tips for preventing the spread of infection discussed.   Advanced Directives:  -Group instruction provided by verbal instruction and written material to support subject matter. Instructor reviews Advanced Directive laws and proper instruction for filling out document.   Pulmonary Video:  -Group video education that reviews the importance of medication and oxygen compliance, exercise, good nutrition, pulmonary hygiene, and pursed lip and diaphragmatic breathing for the pulmonary patient.   Exercise for the Pulmonary Patient:  -Group instruction that is supported by a PowerPoint presentation. Instructor discusses benefits of exercise, core components of exercise, frequency, duration, and intensity of an exercise routine, importance of utilizing pulse oximetry during exercise, safety while exercising, and options of places to exercise outside of rehab.     Pulmonary Medications:  -Verbally interactive  group education provided by instructor with focus on inhaled medications and proper administration.   Anatomy and Physiology of the Respiratory System and Intimacy:  -Group instruction provided by PowerPoint, verbal discussion, and written material to support subject matter. Instructor reviews respiratory cycle and anatomical components of the respiratory system and their functions. Instructor also reviews differences in obstructive and restrictive respiratory diseases with examples of each. Intimacy, Sex, and Sexuality differences are reviewed with a discussion on how relationships can change when diagnosed with pulmonary disease. Common sexual concerns are reviewed.   MD DAY -A group question and answer session with a medical doctor that allows participants to ask questions that relate to their pulmonary disease state.   OTHER EDUCATION -Group or individual verbal, written, or video instructions that support the educational goals of the pulmonary rehab program.   Holiday Eating Survival Tips:  -Group instruction provided by PowerPoint slides, verbal discussion, and written materials to support subject matter. The instructor gives patients tips, tricks, and techniques to help them not only survive but enjoy the holidays despite the onslaught of food that accompanies the holidays.   Knowledge Questionnaire Score:  Knowledge Questionnaire Score - 08/03/21 1122       Knowledge Questionnaire Score   Pre Score 16/18             Core Components/Risk Factors/Patient Goals at Admission:  Personal Goals and Risk Factors at Admission - 08/03/21 1133       Core Components/Risk Factors/Patient Goals on Admission    Weight Management Weight Loss    Improve shortness of breath with ADL's Yes    Intervention Provide education, individualized exercise plan and daily activity instruction to help decrease symptoms of SOB with activities of daily living.    Expected Outcomes Short Term: Improve  cardiorespiratory fitness to achieve a reduction of symptoms when performing ADLs;Long Term: Be able to perform more ADLs without symptoms or delay the onset of symptoms    Diabetes Yes    Intervention Provide education about signs/symptoms and  action to take for hypo/hyperglycemia.;Provide education about proper nutrition, including hydration, and aerobic/resistive exercise prescription along with prescribed medications to achieve blood glucose in normal ranges: Fasting glucose 65-99 mg/dL    Expected Outcomes Short Term: Participant verbalizes understanding of the signs/symptoms and immediate care of hyper/hypoglycemia, proper foot care and importance of medication, aerobic/resistive exercise and nutrition plan for blood glucose control.;Long Term: Attainment of HbA1C < 7%.             Core Components/Risk Factors/Patient Goals Review:    Core Components/Risk Factors/Patient Goals at Discharge (Final Review):    ITP Comments:  Dr. Mechele Collin is Medical Director for Pulmonary Rehab at [facility].

## 2021-08-03 NOTE — Telephone Encounter (Signed)
Left voicemail informing patient of the 7-10 business day turnaround time for disability paperwork along with the potential $29 fee. Asked patient to call back to discuss out of work dates, return dates, etc.

## 2021-08-03 NOTE — Telephone Encounter (Signed)
Patient returned phone call- needing a continuous leave, first day out of work was 09/21/2020 he believes with no return to work date.

## 2021-08-03 NOTE — Patient Instructions (Signed)
Medication Instructions:  The current medical regimen is effective;  continue present plan and medications.  *If you need a refill on your cardiac medications before your next appointment, please call your pharmacy*    Follow-Up: At [facility], you and your health needs are our priority.  As part of our continuing mission to provide you with exceptional heart care, we have created designated Provider Care Teams.  These Care Teams include your primary Cardiologist (physician) and Advanced Practice Providers (APPs -  Physician Assistants and Nurse Practitioners) who all work together to provide you with the care you need, when you need it.  We recommend signing up for the patient portal called "MyChart".  Sign up information is provided on this After Visit Summary.  MyChart is used to connect with patients for Virtual Visits (Telemedicine).  Patients are able to view lab/test results, encounter notes, upcoming appointments, etc.  Non-urgent messages can be sent to your provider as well.   To learn more about what you can do with MyChart, go to [email or web address].    Your next appointment:   As needed  The format for your next appointment:   In Person  Provider:   Onalaska O'Neal, MD      

## 2021-08-03 NOTE — Progress Notes (Addendum)
Philip [name] 55 y.o. male Pulmonary Rehab Orientation Note This patient who was referred to Pulmonary Rehab by Dr, Francine Graven with the diagnosis of Dyspnea on Exertion and Post COVID-19 condition, unspecified. He arrived today in Cardiac and Pulmonary Rehab. He arrived with ambulatory normal gait. He does not carry portable oxygen. Dmarion only uses a CPAP, which he is compliant with. Per pt, Sy uses uses his CPAP at night. Color good, skin warm and dry. Patient is oriented to time and place. Patient's medical history, psychosocial health, and medications reviewed. Psychosocial assessment reveals pt lives with spouse. Haim is currently on short term disability due to his post COVID condition. Pt hobbies include photography and spending time with his grandchildren. He has done multiple photography sessions. Pt reports his stress level is moderate. Lavaughn is currently stressed because he has been dealing with the disability insurance company. Pt does not exhibit signs of depression. PHQ2/9 score 0/0. Brandis shows good  coping skills with positive outlook . Offered emotional support and reassurance. Will continue to monitor. Physical assessment reveals heart rate is normal, breath sounds clear to auscultation, no wheezes, rales, or rhonchi. Grip strength equal, strong. Distal pulses present. Zac reports  he  does take medications as prescribed. Patient states he  follows a Low Sodium, Diabetic diet. The patient has been trying to lose weight through a healthy diet and exercise program. Saeed has been referred to bariatric program to lose weight. Patient's weight will be monitored closely. Demonstration and practice of PLB using pulse oximeter. Jamall able to return demonstration satisfactorily. Safety and hand hygiene in the exercise area reviewed with patient. Amahd voices understanding of the information reviewed. Department expectations discussed with patient and achievable goals were set. The patient shows  enthusiasm about attending the program and we look forward to working with [name]. Xion completed a 6 min walk test today and is scheduled to begin exercise on 08/11/21 at 1:15pm.   [id number] Raford Pitcher, MS, ACSM-CEP

## 2021-08-03 NOTE — Telephone Encounter (Signed)
No paper work or phone notes found regarding this advocate company.  Call made to patient, confirmed DOB. Patient states they should be calling for whatever they need. I asked for a contact number but patient states he did not know it. He just wanted to make [place] aware they would be contacting us.   Will leave encounter open to update regarding disability paperwork.

## 2021-08-03 NOTE — Progress Notes (Signed)
Pulmonary Rehab Orientation Physical Assessment Note  Physical assessment reveals  Pt is alert and oriented x 3.  Heart rate is normal, breath sounds clear to auscultation, no wheezes, rales, or rhonchi however diminished.  Pt has shallow breaths due to his body hiatus Philip [name] also indicated deep breathing causes him to cough. Reports mostly productive cough. Bowel sounds present.   Pt denies any current  abdominal discomfort, nausea, vomiting or diarrhea hoever he self reports that he had diarrhea on yesterday with no reoccurrence this morning.  Grip strength equal, strong. Distal pulses palpable with bilateral swelling  form mid shin to ankles. Left more than right.  Pt reports he keeps his feet elevated at home, wears compresion socks and limits sodium.  Pt seen this morning prior to this appt by Dr. Scharlene Gloss - Cardiology. Per progress notes his swelling is due to venous insufficiency. Alanson Aly, BSN Cardiac and Emergency planning/management officer

## 2021-08-03 NOTE — Progress Notes (Signed)
Philip [name] 55 y.o. male  Initial Psychosocial Assessment  Pt psychosocial assessment reveals pt lives with their spouse. Pt is currently on short term disability. He does have a full-time job at [employer]. Pt hobbies include photography and spending time with his grandchildren. Philip [name] is very passionate about photography as he has done weddings, birthdays, family portraits, and graduation photos. Pt reports his  stress level is moderate. [name] has been stressed lately dealing with the disability insurance company. Pt does not exhibit signs of depression.  PHQ 0/0. Pt shows good  coping skills with positive outlook. Offered emotional support and reassurance. Will continue to monitor.     08/03/2021 1:49 PM

## 2021-08-11 ENCOUNTER — Encounter (HOSPITAL_COMMUNITY): Payer: 59

## 2021-08-11 DIAGNOSIS — Z0289 Encounter for other administrative examinations: Secondary | ICD-10-CM

## 2021-08-13 ENCOUNTER — Encounter (HOSPITAL_COMMUNITY): Payer: 59

## 2021-08-13 ENCOUNTER — Telehealth (HOSPITAL_COMMUNITY): Payer: Self-pay | Admitting: *Deleted

## 2021-08-13 NOTE — Telephone Encounter (Signed)
Paperwork has been signed and faxed to [place]. Patient informed. Copy sent to scan and mailed a copy to the patient. Patient states he will call back later to pay the $29 fee- informed him it will stay on his account. Nothing further needed.

## 2021-08-18 ENCOUNTER — Encounter (HOSPITAL_COMMUNITY)
Admission: RE | Admit: 2021-08-18 | Discharge: 2021-08-18 | Disposition: A | Discharge status: Home / Self Care | Payer: 59 | Source: Ambulatory Visit | Attending: Pulmonary Disease | Admitting: Pulmonary Disease

## 2021-08-18 ENCOUNTER — Other Ambulatory Visit: Payer: Self-pay

## 2021-08-18 DIAGNOSIS — R0609 Other forms of dyspnea: Secondary | ICD-10-CM | POA: Insufficient documentation

## 2021-08-18 DIAGNOSIS — U099 Post covid-19 condition, unspecified: Secondary | ICD-10-CM | POA: Insufficient documentation

## 2021-08-18 LAB — GLUCOSE, CAPILLARY: Glucose-Capillary: 219 mg/dL — ABNORMAL HIGH (ref 70–99)

## 2021-08-18 NOTE — Progress Notes (Addendum)
Daily Session Note  Patient Details  Name: Philip [name] MRN: [id number] Date of Birth: June 17, 1966 Referring Provider:   [name] Pulmonary Rehab Walk Test from 08/03/2021 in [place]  Referring Provider [name]       Encounter Date: 08/18/2021  Check In:  Session Check In - 08/18/21 1419       Check-In   Supervising physician immediately available to respond to emergencies [facility] immediately available    Physician(s) Dr. Cathlean Sauer    Location [facility]    Staff Present [name], RN, BSN;[name], [name], MS, ACSM-CEP, Exercise Physiologist    Virtual Visit No    Medication changes reported     No    Tobacco Cessation No Change    Warm-up and Cool-down Performed as group-led instruction    Resistance Training Performed Yes    VAD Patient? No    PAD/SET Patient? No      Pain Assessment   Currently in Pain? No/denies    Multiple Pain Sites No             Capillary Blood Glucose: Results for orders placed or performed during the hospital encounter of 08/18/21 (from the past 24 hour(s))  Glucose, capillary     Status: Abnormal   Collection Time: 08/18/21  2:15 PM  Result Value Ref Range   Glucose-Capillary 219 (H) 70 - 99 mg/dL      Social History   Tobacco Use  Smoking Status Never  Smokeless Tobacco Never    Goals Met:  Proper associated with RPD/PD & O2 Sat Exercise tolerated well No report of concerns or symptoms today Strength training completed today  Goals Unmet:  Not Applicable  Comments: Service time is from 1319 to 1440. Philip [name] completed his first day of exercise.    [name] is Medical Director for Pulmonary Rehab at [facility].

## 2021-08-18 NOTE — Progress Notes (Signed)
Pulmonary Individual Treatment Plan  Patient Details  Name: Philip [name] MRN: [id number] Date of Birth: 14-Feb-1966 Referring Provider:   Doristine Devoid Pulmonary Rehab Walk Test from 08/03/2021 in [facility] CARDI[facility]  Referring Provider [name]       Initial Encounter Date:  Flowsheet Row Pulmonary Rehab Walk Test from 08/03/2021 in [facility] CARDIAC REHAB  Date 08/03/21       Visit Diagnosis: Dyspnea on exertion  Post covid-19 condition, unspecified  Patient's Home Medications on Admission:   Current Outpatient Medications:    albuterol (VENTOLIN HFA) 108 (90 Base) MCG/ACT inhaler, Inhale 2 puffs into the lungs every 4 (four) hours as needed for wheezing or shortness of breath., Disp: 1 each, Rfl: 0   atenolol (TENORMIN) 50 MG tablet, Take 50 mg by mouth daily., Disp: , Rfl:    atorvastatin (LIPITOR) 20 MG tablet, Take 20 mg by mouth daily., Disp: , Rfl:    FARXIGA 10 MG TABS tablet, Take 10 mg by mouth daily., Disp: , Rfl:    Fluticasone-Umeclidin-Vilant (TRELEGY ELLIPTA) 100-62.5-25 MCG/ACT AEPB, Inhale 1 puff into the lungs daily., Disp: 28 each, Rfl: 6   furosemide (LASIX) 20 MG tablet, Take 1 tablet (20 mg total) by mouth daily., Disp: 30 tablet, Rfl: 6   glyBURIDE (DIABETA) 5 MG tablet, Take 10 mg by mouth 2 (two) times daily., Disp: , Rfl:    loratadine (CLARITIN) 10 MG tablet, Take 10 mg by mouth daily., Disp: , Rfl:    losartan-hydrochlorothiazide (HYZAAR) 50-12.5 MG tablet, Take 1 tablet by mouth daily., Disp: , Rfl:    pioglitazone (ACTOS) 45 MG tablet, Take 45 mg by mouth daily., Disp: , Rfl:   Past Medical History: Past Medical History:  Diagnosis Date   Diabetes mellitus without complication ([name])    High cholesterol    Hypertension     Tobacco Use: Social History   Tobacco Use  Smoking Status Never  Smokeless Tobacco Never    Labs: Recent Review Flowsheet Data     Labs for ITP Cardiac and Pulmonary Rehab  Latest Ref Rng & Units 11/01/2008   TCO2 0 - 100 mmol/L 24       Capillary Blood Glucose: Lab Results  Component Value Date   GLUCAP 195 (H) 11/08/2008   GLUCAP 223 (H) 11/08/2008   GLUCAP 134 (H) 11/07/2008   GLUCAP 142 (H) 11/07/2008   GLUCAP 193 (H) 11/07/2008     Pulmonary Assessment Scores:  Pulmonary Assessment Scores     Row Name 08/03/21 1128         ADL UCSD   ADL Phase Entry     SOB Score total 85       CAT Score   CAT Score 29       mMRC Score   mMRC Score 4             UCSD: Self-administered rating of dyspnea associated with activities of daily living (ADLs) 6-point scale (0 = "not at all" to 5 = "maximal or unable to do because of breathlessness")  Scoring Scores range from 0 to 120.  Minimally important difference is 5 units  CAT: CAT can identify the health impairment of COPD patients and is better correlated with disease progression.  CAT has a scoring range of zero to 40. The CAT score is classified into four groups of low (less than 10), medium (10 - 20), high (21-30) and very high (31-40) based on the impact level of  disease on health status. A CAT score over 10 suggests significant symptoms.  A worsening CAT score could be explained by an exacerbation, poor medication adherence, poor inhaler technique, or progression of COPD or comorbid conditions.  CAT MCID is 2 points  mMRC: mMRC (Modified Medical Research Council) Dyspnea Scale is used to assess the degree of baseline functional disability in patients of respiratory disease due to dyspnea. No minimal important difference is established. A decrease in score of 1 point or greater is considered a positive change.   Pulmonary Function Assessment:  Pulmonary Function Assessment - 08/03/21 1143       Breath   Bilateral Breath Sounds Clear    Shortness of Breath Yes;Limiting activity             Exercise Target Goals: Exercise Program Goal: Individual exercise prescription set using  results from initial 6 min walk test and THRR while considering  patient's activity barriers and safety.   Exercise Prescription Goal: Initial exercise prescription builds to 30-45 minutes a day of aerobic activity, 2-3 days per week.  Home exercise guidelines will be given to patient during program as part of exercise prescription that the participant will acknowledge.  Activity Barriers & Risk Stratification:  Activity Barriers & Cardiac Risk Stratification - 08/03/21 1118       Activity Barriers & Cardiac Risk Stratification   Activity Barriers Shortness of Breath;Deconditioning   knee surgery   Cardiac Risk Stratification Low             6 Minute Walk:  6 Minute Walk     Row Name 08/03/21 1337         6 Minute Walk   Phase Initial     Distance 340 feet     Walk Time 6 minutes     # of Rest Breaks 3  0:58-1:34, 2:45-3:18, 4:14-4:37     MPH 0.64     METS 0.1     RPE 14     Perceived Dyspnea  3.5     VO2 Peak 0.36     Symptoms No     Resting HR 51 bpm     Resting BP 145/84     Resting Oxygen Saturation  98 %     Exercise Oxygen Saturation  during 6 min walk 95 %     Max Ex. HR 67 bpm     Max Ex. BP 164/92     2 Minute Post BP 135/74       Interval HR   1 Minute HR 63     2 Minute HR 61     3 Minute HR 62     4 Minute HR 59     5 Minute HR 63     6 Minute HR 67     2 Minute Post HR 64     Interval Heart Rate? Yes       Interval Oxygen   Interval Oxygen? Yes     Baseline Oxygen Saturation % 98 %     1 Minute Oxygen Saturation % 96 %     1 Minute Liters of Oxygen 0 L     2 Minute Oxygen Saturation % 95 %     2 Minute Liters of Oxygen 0 L     3 Minute Oxygen Saturation % 96 %     3 Minute Liters of Oxygen 0 L     4 Minute Oxygen Saturation % 95 %     4  Minute Liters of Oxygen 0 L     5 Minute Oxygen Saturation % 98 %     5 Minute Liters of Oxygen 0 L     6 Minute Oxygen Saturation % 96 %     6 Minute Liters of Oxygen 0 L     2 Minute Post Oxygen  Saturation % 95 %     2 Minute Post Liters of Oxygen 0 L              Oxygen Initial Assessment:  Oxygen Initial Assessment - 08/10/21 1044       Initial 6 min Walk   Oxygen Used None             Oxygen Re-Evaluation:  Oxygen Re-Evaluation     Row Name 08/10/21 1044             Program Oxygen Prescription   Program Oxygen Prescription None         Home Oxygen   Home Oxygen Device None       Sleep Oxygen Prescription CPAP       Home Exercise Oxygen Prescription None       Home Resting Oxygen Prescription None       Compliance with Home Oxygen Use Yes         Goals/Expected Outcomes   Short Term Goals To learn and exhibit compliance with exercise, home and travel O2 prescription;To learn and understand importance of monitoring SPO2 with pulse oximeter and demonstrate accurate use of the pulse oximeter.;To learn and understand importance of maintaining oxygen saturations>88%;To learn and demonstrate proper pursed lip breathing techniques or other breathing techniques. ;To learn and demonstrate proper use of respiratory medications       Long  Term Goals Exhibits compliance with exercise, home  and travel O2 prescription;Verbalizes importance of monitoring SPO2 with pulse oximeter and return demonstration;Maintenance of O2 saturations>88%;Exhibits proper breathing techniques, such as pursed lip breathing or other method taught during program session;Compliance with respiratory medication;Demonstrates proper use of MDI's       Goals/Expected Outcomes Compliance and understanding of monitoring oxygen saturation and importance of breathing techniques to decrease shortness of breath.                Oxygen Discharge (Final Oxygen Re-Evaluation):  Oxygen Re-Evaluation - 08/10/21 1044       Program Oxygen Prescription   Program Oxygen Prescription None      Home Oxygen   Home Oxygen Device None    Sleep Oxygen Prescription CPAP    Home Exercise Oxygen Prescription  None    Home Resting Oxygen Prescription None    Compliance with Home Oxygen Use Yes      Goals/Expected Outcomes   Short Term Goals To learn and exhibit compliance with exercise, home and travel O2 prescription;To learn and understand importance of monitoring SPO2 with pulse oximeter and demonstrate accurate use of the pulse oximeter.;To learn and understand importance of maintaining oxygen saturations>88%;To learn and demonstrate proper pursed lip breathing techniques or other breathing techniques. ;To learn and demonstrate proper use of respiratory medications    Long  Term Goals Exhibits compliance with exercise, home  and travel O2 prescription;Verbalizes importance of monitoring SPO2 with pulse oximeter and return demonstration;Maintenance of O2 saturations>88%;Exhibits proper breathing techniques, such as pursed lip breathing or other method taught during program session;Compliance with respiratory medication;Demonstrates proper use of MDI's    Goals/Expected Outcomes Compliance and understanding of monitoring oxygen saturation and importance of breathing techniques  to decrease shortness of breath.             Initial Exercise Prescription:  Initial Exercise Prescription - 08/03/21 1300       Date of Initial Exercise RX and Referring Provider   Date 08/03/21    Referring Provider [name]    Expected Discharge Date 10/08/21      NuStep   Level 1    SPM 40    Minutes 20      Prescription Details   Frequency (times per week) 2    Duration Progress to 30 minutes of continuous aerobic without signs/symptoms of physical distress      Intensity   THRR 40-80% of Max Heartrate 66-133    Ratings of Perceived Exertion 11-13    Perceived Dyspnea 0-4      Progression   Progression Continue to progress workloads to maintain intensity without signs/symptoms of physical distress.      Resistance Training   Training Prescription Yes    Weight Red bands    Reps 10-15              Perform Capillary Blood Glucose checks as needed.  Exercise Prescription Changes:   Exercise Comments:   Exercise Goals and Review:   Exercise Goals     Row Name 08/03/21 1142 08/10/21 1043           Exercise Goals   Increase Physical Activity Yes Yes      Intervention Provide advice, education, support and counseling about physical activity/exercise needs.;Develop an individualized exercise prescription for aerobic and resistive training based on initial evaluation findings, risk stratification, comorbidities and participant's personal goals. Provide advice, education, support and counseling about physical activity/exercise needs.;Develop an individualized exercise prescription for aerobic and resistive training based on initial evaluation findings, risk stratification, comorbidities and participant's personal goals.      Expected Outcomes Short Term: Attend rehab on a regular basis to increase amount of physical activity.;Long Term: Exercising regularly at least 3-5 days a week.;Long Term: Add in home exercise to make exercise part of routine and to increase amount of physical activity. Short Term: Attend rehab on a regular basis to increase amount of physical activity.;Long Term: Exercising regularly at least 3-5 days a week.;Long Term: Add in home exercise to make exercise part of routine and to increase amount of physical activity.      Increase Strength and Stamina Yes Yes      Intervention Provide advice, education, support and counseling about physical activity/exercise needs.;Develop an individualized exercise prescription for aerobic and resistive training based on initial evaluation findings, risk stratification, comorbidities and participant's personal goals. Provide advice, education, support and counseling about physical activity/exercise needs.;Develop an individualized exercise prescription for aerobic and resistive training based on initial evaluation findings, risk  stratification, comorbidities and participant's personal goals.      Expected Outcomes Short Term: Increase workloads from initial exercise prescription for resistance, speed, and METs.;Short Term: Perform resistance training exercises routinely during rehab and add in resistance training at home;Long Term: Improve cardiorespiratory fitness, muscular endurance and strength as measured by increased METs and functional capacity ( ) Short Term: Increase workloads from initial exercise prescription for resistance, speed, and METs.;Short Term: Perform resistance training exercises routinely during rehab and add in resistance training at home;Long Term: Improve cardiorespiratory fitness, muscular endurance and strength as measured by increased METs and functional capacity ( )      Able to understand and use rate of perceived exertion (RPE) scale Yes Yes  Intervention Provide education and explanation on how to use RPE scale Provide education and explanation on how to use RPE scale      Expected Outcomes Short Term: Able to use RPE daily in rehab to express subjective intensity level;Long Term:  Able to use RPE to guide intensity level when exercising independently Short Term: Able to use RPE daily in rehab to express subjective intensity level;Long Term:  Able to use RPE to guide intensity level when exercising independently      Able to understand and use Dyspnea scale Yes Yes      Intervention Provide education and explanation on how to use Dyspnea scale Provide education and explanation on how to use Dyspnea scale      Expected Outcomes Short Term: Able to use Dyspnea scale daily in rehab to express subjective sense of shortness of breath during exertion;Long Term: Able to use Dyspnea scale to guide intensity level when exercising independently Short Term: Able to use Dyspnea scale daily in rehab to express subjective sense of shortness of breath during exertion;Long Term: Able to use Dyspnea scale to  guide intensity level when exercising independently      Knowledge and understanding of Target Heart Rate Range (THRR) Yes Yes      Intervention Provide education and explanation of THRR including how the numbers were predicted and where they are located for reference Provide education and explanation of THRR including how the numbers were predicted and where they are located for reference      Expected Outcomes Short Term: Able to state/look up THRR;Short Term: Able to use daily as guideline for intensity in rehab;Long Term: Able to use THRR to govern intensity when exercising independently Short Term: Able to state/look up THRR;Short Term: Able to use daily as guideline for intensity in rehab;Long Term: Able to use THRR to govern intensity when exercising independently      Understanding of Exercise Prescription Yes Yes      Intervention Provide education, explanation, and written materials on patient's individual exercise prescription Provide education, explanation, and written materials on patient's individual exercise prescription      Expected Outcomes Short Term: Able to explain program exercise prescription;Long Term: Able to explain home exercise prescription to exercise independently Short Term: Able to explain program exercise prescription;Long Term: Able to explain home exercise prescription to exercise independently               Exercise Goals Re-Evaluation :  Exercise Goals Re-Evaluation     Row Name 08/10/21 1043             Exercise Goal Re-Evaluation   Exercise Goals Review Increase Physical Activity;Increase Strength and Stamina;Able to understand and use rate of perceived exertion (RPE) scale;Able to understand and use Dyspnea scale;Knowledge and understanding of Target Heart Rate Range (THRR);Understanding of Exercise Prescription       Comments Philip [name] is scheduled to begin exercise this weeek. Will monitor and progress as able.       Expected Outcomes Through exercise  at rehab and home, the patient will decrease shortness of breath with daily activities and feel confident in carrying out an exercise regimn at home.                Discharge Exercise Prescription (Final Exercise Prescription Changes):   Nutrition:  Target Goals: Understanding of nutrition guidelines, daily intake of sodium 1500mg , cholesterol 200mg , calories 30% from fat and 7% or less from saturated fats, daily to have 5 or more servings  of fruits and vegetables.  Biometrics:  Pre Biometrics - 08/03/21 1058       Pre Biometrics   Grip Strength 29 kg              Nutrition Therapy Plan and Nutrition Goals:   Nutrition Assessments:  MEDIFICTS Score Key: ?70 Need to make dietary changes  40-70 Heart Healthy Diet ? 40 Therapeutic Level Cholesterol Diet   Picture Your Plate Scores: <85 Unhealthy dietary pattern with much room for improvement. 41-50 Dietary pattern unlikely to meet recommendations for good health and room for improvement. 51-60 More healthful dietary pattern, with some room for improvement.  >60 Healthy dietary pattern, although there may be some specific behaviors that could be improved.    Nutrition Goals Re-Evaluation:   Nutrition Goals Discharge (Final Nutrition Goals Re-Evaluation):   Psychosocial: Target Goals: Acknowledge presence or absence of significant depression and/or stress, maximize coping skills, provide positive support system. Participant is able to verbalize types and ability to use techniques and skills needed for reducing stress and depression.  Initial Review & Psychosocial Screening:  Initial Psych Review & Screening - 08/03/21 1125       Initial Review   Current issues with None Identified      Family Dynamics   Good Support System? Yes    Comments wife, 1 child, 3 grandchildren      Barriers   Psychosocial barriers to participate in program There are no identifiable barriers or psychosocial needs.       Screening Interventions   Interventions Encouraged to exercise             Quality of Life Scores:  Scores of 19 and below usually indicate a poorer quality of life in these areas.  A difference of  2-3 points is a clinically meaningful difference.  A difference of 2-3 points in the total score of the Quality of Life Index has been associated with significant improvement in overall quality of life, self-image, physical symptoms, and general health in studies assessing change in quality of life.  PHQ-9: Recent Review Flowsheet Data     Depression screen [facility] 2/9 08/03/2021   Decreased Interest 0   Down, Depressed, Hopeless 0   PHQ - 2 Score 0   Altered sleeping 0   Tired, decreased energy 0   Change in appetite 0   Feeling bad or failure about yourself  0   Trouble concentrating 0   Moving slowly or fidgety/restless 0   Suicidal thoughts 0   PHQ-9 Score 0   Difficult doing work/chores Not difficult at all      Interpretation of Total Score  Total Score Depression Severity:  1-4 = Minimal depression, 5-9 = Mild depression, 10-14 = Moderate depression, 15-19 = Moderately severe depression, 20-27 = Severe depression   Psychosocial Evaluation and Intervention:   Psychosocial Re-Evaluation:  Psychosocial Re-Evaluation     Row Name 08/13/21 [phone]             Psychosocial Re-Evaluation   Current issues with None Identified       Comments Philip [name] has not been able to attend any exercise sessions due to having a stomach.       Expected Outcomes Encourage to exercise       Interventions Encouraged to attend Cardiac Rehabilitation for the exercise;Encouraged to attend Pulmonary Rehabilitation for the exercise       Continue Psychosocial Services  No Follow up required  Psychosocial Discharge (Final Psychosocial Re-Evaluation):  Psychosocial Re-Evaluation - 08/13/21 0947       Psychosocial Re-Evaluation   Current issues with None Identified    Comments  Philip [name] has not been able to attend any exercise sessions due to having a stomach.    Expected Outcomes Encourage to exercise    Interventions Encouraged to attend Cardiac Rehabilitation for the exercise;Encouraged to attend Pulmonary Rehabilitation for the exercise    Continue Psychosocial Services  No Follow up required             Education: Education Goals: Education classes will be provided on a weekly basis, covering required topics. Participant will state understanding/return demonstration of topics presented.  Learning Barriers/Preferences:  Learning Barriers/Preferences - 08/03/21 1131       Learning Barriers/Preferences   Learning Barriers Sight   wears glasses   Learning Preferences Audio;Computer/Internet;Group Instruction;Individual Instruction;Pictoral;Skilled Demonstration;Verbal Instruction;Video;Written Material             Education Topics: Risk Factor Reduction:  -Group instruction that is supported by a PowerPoint presentation. Instructor discusses the definition of a risk factor, different risk factors for pulmonary disease, and how the heart and lungs work together.     Nutrition for Pulmonary Patient:  -Group instruction provided by PowerPoint slides, verbal discussion, and written materials to support subject matter. The instructor gives an explanation and review of healthy diet recommendations, which includes a discussion on weight management, recommendations for fruit and vegetable consumption, as well as protein, fluid, caffeine, fiber, sodium, sugar, and alcohol. Tips for eating when patients are short of breath are discussed.   Pursed Lip Breathing:  -Group instruction that is supported by demonstration and informational handouts. Instructor discusses the benefits of pursed lip and diaphragmatic breathing and detailed demonstration on how to preform both.     Oxygen Safety:  -Group instruction provided by PowerPoint, verbal discussion, and written  material to support subject matter. There is an overview of "What is Oxygen" and "Why do we need it".  Instructor also reviews how to create a safe environment for oxygen use, the importance of using oxygen as prescribed, and the risks of noncompliance. There is a brief discussion on traveling with oxygen and resources the patient may utilize.   Oxygen Equipment:  -Group instruction provided by [facility] Staff utilizing handouts, written materials, and equipment demonstrations.   Signs and Symptoms:  -Group instruction provided by written material and verbal discussion to support subject matter. Warning signs and symptoms of infection, stroke, and heart attack are reviewed and when to call the physician/911 reinforced. Tips for preventing the spread of infection discussed.   Advanced Directives:  -Group instruction provided by verbal instruction and written material to support subject matter. Instructor reviews Advanced Directive laws and proper instruction for filling out document.   Pulmonary Video:  -Group video education that reviews the importance of medication and oxygen compliance, exercise, good nutrition, pulmonary hygiene, and pursed lip and diaphragmatic breathing for the pulmonary patient.   Exercise for the Pulmonary Patient:  -Group instruction that is supported by a PowerPoint presentation. Instructor discusses benefits of exercise, core components of exercise, frequency, duration, and intensity of an exercise routine, importance of utilizing pulse oximetry during exercise, safety while exercising, and options of places to exercise outside of rehab.     Pulmonary Medications:  -Verbally interactive group education provided by instructor with focus on inhaled medications and proper administration.   Anatomy and Physiology of the Respiratory System and Intimacy:  -Group instruction  provided by PowerPoint, verbal discussion, and written material to support subject matter.  Instructor reviews respiratory cycle and anatomical components of the respiratory system and their functions. Instructor also reviews differences in obstructive and restrictive respiratory diseases with examples of each. Intimacy, Sex, and Sexuality differences are reviewed with a discussion on how relationships can change when diagnosed with pulmonary disease. Common sexual concerns are reviewed.   MD DAY -A group question and answer session with a medical doctor that allows participants to ask questions that relate to their pulmonary disease state.   OTHER EDUCATION -Group or individual verbal, written, or video instructions that support the educational goals of the pulmonary rehab program.   Holiday Eating Survival Tips:  -Group instruction provided by PowerPoint slides, verbal discussion, and written materials to support subject matter. The instructor gives patients tips, tricks, and techniques to help them not only survive but enjoy the holidays despite the onslaught of food that accompanies the holidays.   Knowledge Questionnaire Score:  Knowledge Questionnaire Score - 08/03/21 1122       Knowledge Questionnaire Score   Pre Score 16/18             Core Components/Risk Factors/Patient Goals at Admission:  Personal Goals and Risk Factors at Admission - 08/03/21 1133       Core Components/Risk Factors/Patient Goals on Admission    Weight Management Weight Loss    Improve shortness of breath with ADL's Yes    Intervention Provide education, individualized exercise plan and daily activity instruction to help decrease symptoms of SOB with activities of daily living.    Expected Outcomes Short Term: Improve cardiorespiratory fitness to achieve a reduction of symptoms when performing ADLs;Long Term: Be able to perform more ADLs without symptoms or delay the onset of symptoms    Diabetes Yes    Intervention Provide education about signs/symptoms and action to take for  hypo/hyperglycemia.;Provide education about proper nutrition, including hydration, and aerobic/resistive exercise prescription along with prescribed medications to achieve blood glucose in normal ranges: Fasting glucose 65-99 mg/dL    Expected Outcomes Short Term: Participant verbalizes understanding of the signs/symptoms and immediate care of hyper/hypoglycemia, proper foot care and importance of medication, aerobic/resistive exercise and nutrition plan for blood glucose control.;Long Term: Attainment of HbA1C < 7%.             Core Components/Risk Factors/Patient Goals Review:   Goals and Risk Factor Review     Row Name 08/13/21 0949             Core Components/Risk Factors/Patient Goals Review   Personal Goals Review Weight Management/Obesity;Improve shortness of breath with ADL's;Develop more efficient breathing techniques such as purse lipped breathing and diaphragmatic breathing and practicing self-pacing with activity.;Increase knowledge of respiratory medications and ability to use respiratory devices properly.       Review Philip [name] has not been able to attend any exercsie sessions due to having a stomach virus.       Expected Outcomes See initial core components                Core Components/Risk Factors/Patient Goals at Discharge (Final Review):   Goals and Risk Factor Review - 08/13/21 0949       Core Components/Risk Factors/Patient Goals Review   Personal Goals Review Weight Management/Obesity;Improve shortness of breath with ADL's;Develop more efficient breathing techniques such as purse lipped breathing and diaphragmatic breathing and practicing self-pacing with activity.;Increase knowledge of respiratory medications and ability to use respiratory devices properly.  Review Philip [name] has not been able to attend any exercsie sessions due to having a stomach virus.    Expected Outcomes See initial core components             ITP Comments:   Comments: ITP  REVIEW Pt is making expected progress toward pulmonary rehab goals after completing 0 sessions. Recommend continued exercise, life style modification, education, and utilization of breathing techniques to increase stamina and strength and decrease shortness of breath with exertion. Dr. Mechele Collin is Medical Director for Pulmonary Rehab at [facility].

## 2021-08-20 ENCOUNTER — Encounter (HOSPITAL_COMMUNITY)
Admission: RE | Admit: 2021-08-20 | Discharge: 2021-08-20 | Disposition: A | Discharge status: Home / Self Care | Payer: 59 | Source: Ambulatory Visit | Attending: Pulmonary Disease | Admitting: Pulmonary Disease

## 2021-08-20 ENCOUNTER — Other Ambulatory Visit: Payer: Self-pay

## 2021-08-20 ENCOUNTER — Telehealth (HOSPITAL_COMMUNITY): Payer: Self-pay | Admitting: *Deleted

## 2021-08-20 DIAGNOSIS — R0609 Other forms of dyspnea: Secondary | ICD-10-CM

## 2021-08-20 DIAGNOSIS — U099 Post covid-19 condition, unspecified: Secondary | ICD-10-CM

## 2021-08-20 LAB — GLUCOSE, CAPILLARY: Glucose-Capillary: 262 mg/dL — ABNORMAL HIGH (ref 70–99)

## 2021-08-20 NOTE — Telephone Encounter (Signed)
Philip [name] left today from the PR exercise session after getting off of the equipment.At first thought that he had gone to the bathroom. We  looked for him in the bathroom, but he was not in there. One of our other patients said that he thought we were finished. I was able to contact him and he said he thought we were finished. I explained to him that we needed to do stretching and cool down before we leave each time. He states that he was sorry. Philip [name] denied and symptoms of low blood sugar and stated that he felt fine.

## 2021-08-20 NOTE — Progress Notes (Addendum)
Daily Session Note  Patient Details  Name: Philip [name] MRN: [id number] Date of Birth: December 08, 1965 Referring Provider:   April [name] Pulmonary Rehab Walk Test from 08/03/2021 in [place]  Referring Provider Philip [name]       Encounter Date: 08/20/2021  Check In:  Session Check In - 08/20/21 1428       Check-In   Supervising physician immediately available to respond to emergencies [facility] immediately available    Physician(s) Philip [name]    Location [facility]    Staff Present Philip Else, MS, ACSM-CEP, Exercise Physiologist;Philip Wilmer Colletta Maryland, RN, MHA    Virtual Visit No    Medication changes reported     No    Fall or balance concerns reported    No    Tobacco Cessation No Change    Warm-up and Cool-down Performed as group-led instruction    Resistance Training Performed No    VAD Patient? No    PAD/SET Patient? No      Pain Assessment   Currently in Pain? No/denies    Multiple Pain Sites No             Capillary Blood Glucose: Results for orders placed or performed during the hospital encounter of 08/18/21 (from the past 24 hour(s))  Glucose, capillary     Status: Abnormal   Collection Time: 08/20/21  1:18 PM  Result Value Ref Range   Glucose-Capillary 262 (H) 70 - 99 mg/dL      Social History   Tobacco Use  Smoking Status Never  Smokeless Tobacco Never    Goals Met:  Exercise tolerated well No report of concerns or symptoms today Strength training completed today  Goals Unmet:  Not Applicable  Comments: Service time is from 1315 to Philip [name] left after getting off of the exercise equipment and did not stay for cool down. We thought he had gone to the bathroom, but did not find him there.   Philip [name] is Medical Director for Pulmonary Rehab at [facility].

## 2021-08-25 ENCOUNTER — Other Ambulatory Visit: Payer: Self-pay

## 2021-08-25 ENCOUNTER — Encounter (HOSPITAL_COMMUNITY)
Admission: RE | Admit: 2021-08-25 | Discharge: 2021-08-25 | Disposition: A | Discharge status: Home / Self Care | Payer: 59 | Source: Ambulatory Visit | Attending: Pulmonary Disease | Admitting: Pulmonary Disease

## 2021-08-25 VITALS — Wt >= 6400 oz

## 2021-08-25 DIAGNOSIS — R0609 Other forms of dyspnea: Secondary | ICD-10-CM

## 2021-08-25 DIAGNOSIS — U099 Post covid-19 condition, unspecified: Secondary | ICD-10-CM

## 2021-08-25 LAB — GLUCOSE, CAPILLARY
Glucose-Capillary: 295 mg/dL — ABNORMAL HIGH (ref 70–99)
Glucose-Capillary: 209 mg/dL — ABNORMAL HIGH (ref 70–99)

## 2021-08-25 NOTE — Progress Notes (Signed)
Daily Session Note  Patient Details  Name: Philip [name] MRN: [id number] Date of Birth: January 03, 1966 Referring Provider:   [name] Pulmonary Rehab Walk Test from 08/03/2021 in [place]  Referring Provider [name]       Encounter Date: 08/25/2021  Check In:  Session Check In - 08/25/21 1413       Check-In   Supervising physician immediately available to respond to emergencies [facility] immediately available    Physician(s) Dr. Broadus John    [facility]    Staff Present [name], [name], MS, ACSM-CEP, Exercise Physiologist    Virtual Visit No    Medication changes reported     No    Fall or balance concerns reported    No    Tobacco Cessation No Change    Warm-up and Cool-down Performed as group-led instruction    Resistance Training Performed Yes    VAD Patient? No    PAD/SET Patient? No      Pain Assessment   Currently in Pain? No/denies    Multiple Pain Sites No             Capillary Blood Glucose: Results for orders placed or performed during the hospital encounter of 08/25/21 (from the past 24 hour(s))  Glucose, capillary     Status: Abnormal   Collection Time: 08/25/21  2:30 PM  Result Value Ref Range   Glucose-Capillary 209 (H) 70 - 99 mg/dL    POCT Glucose - 08/25/21 1511       POCT Blood Glucose   Pre-Exercise 295 mg/dL    Post-Exercise 209 mg/dL             Exercise Prescription Changes - 08/25/21 1500       Response to Exercise   Blood Pressure (Admit) 162/84    Blood Pressure (Exercise) 140/82    Blood Pressure (Exit) 134/80    Heart Rate (Admit) 68 bpm    Heart Rate (Exercise) 72 bpm    Heart Rate (Exit) 61 bpm    Oxygen Saturation (Admit) 94 %    Oxygen Saturation (Exercise) 95 %    Oxygen Saturation (Exit) 96 %    Rating of Perceived Exertion (Exercise) 13    Perceived Dyspnea (Exercise) 2    Duration Progress to 30 minutes of  aerobic without  signs/symptoms of physical distress    Intensity THRR unchanged      Resistance Training   Training Prescription Yes    Weight Red bands    Reps 10-15    Time 10 Minutes      NuStep   Level 1    SPM 60    Minutes 30    METs 1.8             Social History   Tobacco Use  Smoking Status Never  Smokeless Tobacco Never    Goals Met:  Exercise tolerated well No report of concerns or symptoms today Strength training completed today  Goals Unmet:  Not Applicable  Comments: Service time is from 1314 to 1430.   [name] is Medical Director for Pulmonary Rehab at [facility].

## 2021-08-27 ENCOUNTER — Encounter (HOSPITAL_COMMUNITY): Payer: 59

## 2021-08-27 ENCOUNTER — Telehealth (HOSPITAL_COMMUNITY): Payer: Self-pay

## 2021-08-27 NOTE — Telephone Encounter (Signed)
Called to check up on pt. Pt stated he had to miss Pulmonary Rehab to pick up grandchild. Mentioned to pt that he could attend morning class if he was busy in the afternoon. Pt stated that it would be a one time occurrence. Discussed the importance of attendance. Taequan voiced understanding.

## 2021-09-01 ENCOUNTER — Other Ambulatory Visit: Payer: Self-pay

## 2021-09-01 ENCOUNTER — Telehealth: Payer: Self-pay | Admitting: Pulmonary Disease

## 2021-09-01 ENCOUNTER — Encounter (HOSPITAL_COMMUNITY)
Admission: RE | Admit: 2021-09-01 | Discharge: 2021-09-01 | Disposition: A | Discharge status: Home / Self Care | Payer: 59 | Source: Ambulatory Visit | Attending: Pulmonary Disease | Admitting: Pulmonary Disease

## 2021-09-01 DIAGNOSIS — U099 Post covid-19 condition, unspecified: Secondary | ICD-10-CM

## 2021-09-01 DIAGNOSIS — R0609 Other forms of dyspnea: Secondary | ICD-10-CM | POA: Diagnosis not present

## 2021-09-01 LAB — GLUCOSE, CAPILLARY
Glucose-Capillary: 257 mg/dL — ABNORMAL HIGH (ref 70–99)
Glucose-Capillary: 199 mg/dL — ABNORMAL HIGH (ref 70–99)

## 2021-09-01 NOTE — Progress Notes (Signed)
Daily Session Note  Patient Details  Name: Philip [name] MRN: [id number] Date of Birth: [birth year] Referring Provider:   [name] Pulmonary Rehab Walk Test from 08/03/2021 in [place]  Referring Provider [name]       Encounter Date: 09/01/2021  Check In:  Session Check In - 09/01/21 1408       Check-In   Supervising physician immediately available to respond to emergencies [facility] immediately available    Physician(s) Dr. Maren Beach    [facility]    Staff Present [name], [name], MS, ACSM-CEP, Exercise Physiologist    Virtual Visit No    Medication changes reported     No    Fall or balance concerns reported    No    Tobacco Cessation No Change    Warm-up and Cool-down Performed as group-led instruction    Resistance Training Performed Yes    VAD Patient? No    PAD/SET Patient? No      Pain Assessment   Currently in Pain? No/denies    Multiple Pain Sites No             Capillary Blood Glucose: Results for orders placed or performed during the hospital encounter of 09/01/21 (from the past 24 hour(s))  Glucose, capillary     Status: Abnormal   Collection Time: 09/01/21  1:22 PM  Result Value Ref Range   Glucose-Capillary 257 (H) 70 - 99 mg/dL  Glucose, capillary     Status: Abnormal   Collection Time: 09/01/21  2:39 PM  Result Value Ref Range   Glucose-Capillary 199 (H) 70 - 99 mg/dL      Social History   Tobacco Use  Smoking Status Never  Smokeless Tobacco Never    Goals Met:  Personal goals reviewed No report of concerns or symptoms today Strength training completed today  Goals Unmet:  Not Applicable  Comments: Service time is from 1323 to [place]    [name] is Medical Director for Pulmonary Rehab at [facility].

## 2021-09-01 NOTE — Telephone Encounter (Signed)
No forms have been located.   Place return phone call, placed on hold for extended amount of time.

## 2021-09-02 NOTE — Telephone Encounter (Signed)
I checked Philip [name]'s mail up front and did not see anything for this patient.

## 2021-09-02 NOTE — Telephone Encounter (Signed)
Philip [name], did you ever see any forms on this pt? Please advise and I will call back and let them know if they need to refax this, thanks!

## 2021-09-03 ENCOUNTER — Other Ambulatory Visit: Payer: Self-pay

## 2021-09-03 ENCOUNTER — Encounter (HOSPITAL_COMMUNITY)
Admission: RE | Admit: 2021-09-03 | Discharge: 2021-09-03 | Disposition: A | Discharge status: Home / Self Care | Payer: 59 | Source: Ambulatory Visit | Attending: Pulmonary Disease | Admitting: Pulmonary Disease

## 2021-09-03 DIAGNOSIS — U099 Post covid-19 condition, unspecified: Secondary | ICD-10-CM

## 2021-09-03 DIAGNOSIS — R0609 Other forms of dyspnea: Secondary | ICD-10-CM

## 2021-09-03 NOTE — Telephone Encounter (Signed)
Called and was on hold for several minutes. Opted to leave a VM to re-fax the trelegy forms. Will keep message open to until we get forms.

## 2021-09-03 NOTE — Progress Notes (Signed)
Daily Session Note  Patient Details  Name: Philip [name] MRN: [id number] Date of Birth: August 18, 1966 Referring Provider:   [name] Pulmonary Rehab Walk Test from 08/03/2021 in [place]  Referring Provider [name]       Encounter Date: 09/03/2021  Check In:  Session Check In - 09/03/21 1410       Check-In   Supervising physician immediately available to respond to emergencies [facility] immediately available    Physician(s) Dr. Vernell Barrier    [facility]    Staff Present [name], RN, Quentin Ore, MS, ACSM-CEP, Exercise Physiologist;Marquis Diles Ysidro Evert, RN    Virtual Visit No    Medication changes reported     No    Fall or balance concerns reported    No    Tobacco Cessation No Change    Warm-up and Cool-down Performed as group-led instruction    Resistance Training Performed Yes    VAD Patient? No    PAD/SET Patient? No      Pain Assessment   Currently in Pain? No/denies    Multiple Pain Sites No             Capillary Blood Glucose: No results found for this or any previous visit (from the past 24 hour(s)).    Social History   Tobacco Use  Smoking Status Never  Smokeless Tobacco Never    Goals Met:  Exercise tolerated well No report of concerns or symptoms today Strength training completed today  Goals Unmet:  Not Applicable  Comments: Service time is from 1317 to 1438    [name] is Medical Director for Pulmonary Rehab at [facility].

## 2021-09-08 ENCOUNTER — Other Ambulatory Visit: Payer: Self-pay

## 2021-09-08 ENCOUNTER — Encounter (HOSPITAL_COMMUNITY)
Admission: RE | Admit: 2021-09-08 | Discharge: 2021-09-08 | Disposition: A | Discharge status: Home / Self Care | Payer: 59 | Source: Ambulatory Visit | Attending: Pulmonary Disease | Admitting: Pulmonary Disease

## 2021-09-08 VITALS — Wt >= 6400 oz

## 2021-09-08 DIAGNOSIS — U099 Post covid-19 condition, unspecified: Secondary | ICD-10-CM

## 2021-09-08 DIAGNOSIS — R0609 Other forms of dyspnea: Secondary | ICD-10-CM

## 2021-09-08 NOTE — Telephone Encounter (Signed)
Called again and was placed on a long hold. Cherina- did you ever receive any forms on this pt? Thanks!

## 2021-09-08 NOTE — Progress Notes (Signed)
Daily Session Note  Patient Details  Name: Philip [name] MRN: [id number] Date of Birth: [birth year] Referring Provider:   [name] Pulmonary Rehab Walk Test from 08/03/2021 in [place]  Referring Provider [name]       Encounter Date: 09/08/2021  Check In:  Session Check In - 09/08/21 1429       Check-In   Supervising physician immediately available to respond to emergencies [facility] immediately available    Physician(s) Dr. Maryland Pink    [facility]    Staff Present [name], [name], MS, ACSM-CEP, Exercise Physiologist    Virtual Visit No    Medication changes reported     No    Fall or balance concerns reported    No    Tobacco Cessation No Change    Warm-up and Cool-down Performed as group-led instruction    Resistance Training Performed Yes    VAD Patient? No    PAD/SET Patient? No      Pain Assessment   Currently in Pain? No/denies    Multiple Pain Sites No             Capillary Blood Glucose: No results found for this or any previous visit (from the past 24 hour(s)).  POCT Glucose - 09/08/21 1541       POCT Blood Glucose   Pre-Exercise 244 mg/dL    Post-Exercise 180 mg/dL             Exercise Prescription Changes - 09/08/21 1500       Response to Exercise   Blood Pressure (Admit) 138/72    Blood Pressure (Exercise) 140/72    Blood Pressure (Exit) 124/60    Heart Rate (Admit) 60 bpm    Heart Rate (Exercise) 69 bpm    Heart Rate (Exit) 59 bpm    Oxygen Saturation (Admit) 97 %    Oxygen Saturation (Exercise) 96 %    Oxygen Saturation (Exit) 95 %    Rating of Perceived Exertion (Exercise) 13    Perceived Dyspnea (Exercise) 3    Duration Continue with 30 min of aerobic exercise without signs/symptoms of physical distress.    Intensity THRR unchanged      Progression   Progression Continue to progress workloads to maintain intensity without signs/symptoms of  physical distress.      Resistance Training   Training Prescription Yes    Weight Red bands    Reps 10-15    Time 10 Minutes      NuStep   Level 2    SPM 70    Minutes 20    METs 1.9      Track   Laps 6    Minutes 15             Social History   Tobacco Use  Smoking Status Never  Smokeless Tobacco Never    Goals Met:  Proper associated with RPD/PD & O2 Sat Independence with exercise equipment Exercise tolerated well No report of concerns or symptoms today Strength training completed today  Goals Unmet:  Not Applicable  Comments: Service time is from 1323 to 1445.    [name] is Medical Director for Pulmonary Rehab at [facility].

## 2021-09-08 NOTE — Telephone Encounter (Signed)
ATC x1, reached a message that I had called outside of normal business hours.  Normal business hours are M-F 9:30 am-4:30 pm.

## 2021-09-08 NOTE — Addendum Note (Signed)
Encounter addended by: Joya San on: 09/08/2021 3:41 PM  Actions taken: Flowsheet data copied forward, Flowsheet accepted

## 2021-09-10 ENCOUNTER — Encounter (HOSPITAL_COMMUNITY): Payer: 59

## 2021-09-15 ENCOUNTER — Other Ambulatory Visit: Payer: Self-pay

## 2021-09-15 ENCOUNTER — Encounter (HOSPITAL_COMMUNITY)
Admission: RE | Admit: 2021-09-15 | Discharge: 2021-09-15 | Disposition: A | Discharge status: Home / Self Care | Payer: 59 | Source: Ambulatory Visit | Attending: Pulmonary Disease | Admitting: Pulmonary Disease

## 2021-09-15 DIAGNOSIS — E119 Type 2 diabetes mellitus without complications: Secondary | ICD-10-CM | POA: Diagnosis not present

## 2021-09-15 DIAGNOSIS — R0609 Other forms of dyspnea: Secondary | ICD-10-CM | POA: Insufficient documentation

## 2021-09-15 DIAGNOSIS — Z723 Lack of physical exercise: Secondary | ICD-10-CM | POA: Diagnosis not present

## 2021-09-15 DIAGNOSIS — U099 Post covid-19 condition, unspecified: Secondary | ICD-10-CM | POA: Diagnosis present

## 2021-09-15 LAB — GLUCOSE, CAPILLARY
Glucose-Capillary: 194 mg/dL — ABNORMAL HIGH (ref 70–99)
Glucose-Capillary: 171 mg/dL — ABNORMAL HIGH (ref 70–99)

## 2021-09-15 NOTE — Progress Notes (Signed)
Daily Session Note  Patient Details  Name: Philip [name] MRN: [id number] Date of Birth: [birth year] Referring Provider:   [name] Pulmonary Rehab Walk Test from 08/03/2021 in [place]  Referring Provider [name]       Encounter Date: 09/15/2021  Check In:  Session Check In - 09/15/21 1412       Check-In   Supervising physician immediately available to respond to emergencies [facility] immediately available    Physician(s) Dr. Arbutus Ped    [facility]    Staff Present [name], RN, Quentin Ore, MS, ACSM-CEP, Exercise Physiologist;[name], RN    Virtual Visit No    Medication changes reported     No    Fall or balance concerns reported    No    Tobacco Cessation No Change    Warm-up and Cool-down Performed as group-led instruction    Resistance Training Performed Yes    VAD Patient? No    PAD/SET Patient? No      Pain Assessment   Currently in Pain? No/denies    Multiple Pain Sites No             Capillary Blood Glucose: Results for orders placed or performed during the hospital encounter of 09/15/21 (from the past 24 hour(s))  Glucose, capillary     Status: Abnormal   Collection Time: 09/15/21  2:12 PM  Result Value Ref Range   Glucose-Capillary 171 (H) 70 - 99 mg/dL     Exercise Prescription Changes - 09/15/21 1400       Home Exercise Plan   Plans to continue exercise at Home (comment)   walks around department stores   Frequency Add 1 additional day to program exercise sessions.    Initial Home Exercises Provided 09/15/21             Social History   Tobacco Use  Smoking Status Never  Smokeless Tobacco Never    Goals Met:  Independence with exercise equipment Exercise tolerated well No report of concerns or symptoms today Strength training completed today  Goals Unmet:  Not Applicable  Comments: Service time is from 1321 to 1435.    [name] is  Medical Director for Pulmonary Rehab at [facility].

## 2021-09-15 NOTE — Progress Notes (Signed)
Home Exercise Prescription I have reviewed a Home Exercise Prescription with Laray Anger. Cadien is not currently exercising at home. Berny stated that he walks around the store while his wife shops. He said that he walks 2 non-rehab days/wk for 15-20 min/day. I am not confident in his home exercise, because he has said that he will sit down and let his wife shop. I encouraged Samul to walk laps around the store while his wife is shopping. I recommended he increase his time to 30 min/day. I am not sure how motivated Lavarius is to exercise on his own. I discussed benefits of walking and pacing himself. Keylon stated that they understand the exercise prescription. The patient stated that their goals were to lose weight. We reviewed exercise guidelines, target heart rate during exercise, RPE Scale, weather conditions, endpoints for exercise, warmup and cool down. The patient is encouraged to come to me with any questions. I will continue to follow up with the patient to assist them with progression and safety.    Sheppard Plumber, MS, ACSM-CEP 09/15/2021 2:58 PM

## 2021-09-16 NOTE — Progress Notes (Signed)
Pulmonary Individual Treatment Plan  Patient Details  Name: Philip [name] MRN: [id number] Date of Birth: [birth year] Referring Provider:   Doristine Devoid Pulmonary Rehab Walk Test from 08/03/2021 in [facility]ARDI[facility]  Referring Provider [name]       Initial Encounter Date:  Flowsheet Row Pulmonary Rehab Walk Test from 08/03/2021 in [facility] CARDIAC REHAB  Date 08/03/21       Visit Diagnosis: Dyspnea on exertion  Post covid-19 condition, unspecified  Patient's Home Medications on Admission:   Current Outpatient Medications:    albuterol (VENTOLIN HFA) 108 (90 Base) MCG/ACT inhaler, Inhale 2 puffs into the lungs every 4 (four) hours as needed for wheezing or shortness of breath., Disp: 1 each, Rfl: 0   atenolol (TENORMIN) 50 MG tablet, Take 50 mg by mouth daily., Disp: , Rfl:    atorvastatin (LIPITOR) 20 MG tablet, Take 20 mg by mouth daily., Disp: , Rfl:    FARXIGA 10 MG TABS tablet, Take 10 mg by mouth daily., Disp: , Rfl:    Fluticasone-Umeclidin-Vilant (TRELEGY ELLIPTA) 100-62.5-25 MCG/ACT AEPB, Inhale 1 puff into the lungs daily., Disp: 28 each, Rfl: 6   furosemide (LASIX) 20 MG tablet, Take 1 tablet (20 mg total) by mouth daily., Disp: 30 tablet, Rfl: 6   glyBURIDE (DIABETA) 5 MG tablet, Take 10 mg by mouth 2 (two) times daily., Disp: , Rfl:    loratadine (CLARITIN) 10 MG tablet, Take 10 mg by mouth daily., Disp: , Rfl:    losartan-hydrochlorothiazide (HYZAAR) 50-12.5 MG tablet, Take 1 tablet by mouth daily., Disp: , Rfl:    pioglitazone (ACTOS) 45 MG tablet, Take 45 mg by mouth daily., Disp: , Rfl:   Past Medical History: Past Medical History:  Diagnosis Date   Diabetes mellitus without complication ([name])    High cholesterol    Hypertension     Tobacco Use: Social History   Tobacco Use  Smoking Status Never  Smokeless Tobacco Never    Labs: Recent Review Flowsheet Data     Labs for ITP Cardiac and Pulmonary Rehab  Latest Ref Rng & Units 11/01/2008   TCO2 0 - 100 mmol/L 24       Capillary Blood Glucose: Lab Results  Component Value Date   GLUCAP 171 (H) 09/15/2021   GLUCAP 194 (H) 09/15/2021   GLUCAP 199 (H) 09/01/2021   GLUCAP 257 (H) 09/01/2021   GLUCAP 209 (H) 08/25/2021    POCT Glucose     Row Name 08/25/21 1511 09/08/21 1541           POCT Blood Glucose   Pre-Exercise 295 mg/dL 308 mg/dL      Post-Exercise 657 mg/dL 846 mg/dL               Pulmonary Assessment Scores:  Pulmonary Assessment Scores     Row Name 08/03/21 1128         ADL UCSD   ADL Phase Entry     SOB Score total 85       CAT Score   CAT Score 29       mMRC Score   mMRC Score 4             UCSD: Self-administered rating of dyspnea associated with activities of daily living (ADLs) 6-point scale (0 = "not at all" to 5 = "maximal or unable to do because of breathlessness")  Scoring Scores range from 0 to 120.  Minimally important difference is 5 units  CAT:  CAT can identify the health impairment of COPD patients and is better correlated with disease progression.  CAT has a scoring range of zero to 40. The CAT score is classified into four groups of low (less than 10), medium (10 - 20), high (21-30) and very high (31-40) based on the impact level of disease on health status. A CAT score over 10 suggests significant symptoms.  A worsening CAT score could be explained by an exacerbation, poor medication adherence, poor inhaler technique, or progression of COPD or comorbid conditions.  CAT MCID is 2 points  mMRC: mMRC (Modified Medical Research Council) Dyspnea Scale is used to assess the degree of baseline functional disability in patients of respiratory disease due to dyspnea. No minimal important difference is established. A decrease in score of 1 point or greater is considered a positive change.   Pulmonary Function Assessment:  Pulmonary Function Assessment - 08/03/21 1143       Breath    Bilateral Breath Sounds Clear    Shortness of Breath Yes;Limiting activity             Exercise Target Goals: Exercise Program Goal: Individual exercise prescription set using results from initial 6 min walk test and THRR while considering  patients activity barriers and safety.   Exercise Prescription Goal: Initial exercise prescription builds to 30-45 minutes a day of aerobic activity, 2-3 days per week.  Home exercise guidelines will be given to patient during program as part of exercise prescription that the participant will acknowledge.  Activity Barriers & Risk Stratification:  Activity Barriers & Cardiac Risk Stratification - 08/03/21 1118       Activity Barriers & Cardiac Risk Stratification   Activity Barriers Shortness of Breath;Deconditioning   knee surgery   Cardiac Risk Stratification Low             6 Minute Walk:  6 Minute Walk     Row Name 08/03/21 1337         6 Minute Walk   Phase Initial     Distance 340 feet     Walk Time 6 minutes     # of Rest Breaks 3  0:58-1:34, 2:45-3:18, 4:14-4:37     MPH 0.64     METS 0.1     RPE 14     Perceived Dyspnea  3.5     VO2 Peak 0.36     Symptoms No     Resting HR 51 bpm     Resting BP 145/84     Resting Oxygen Saturation  98 %     Exercise Oxygen Saturation  during 6 min walk 95 %     Max Ex. HR 67 bpm     Max Ex. BP 164/92     2 Minute Post BP 135/74       Interval HR   1 Minute HR 63     2 Minute HR 61     3 Minute HR 62     4 Minute HR 59     5 Minute HR 63     6 Minute HR 67     2 Minute Post HR 64     Interval Heart Rate? Yes       Interval Oxygen   Interval Oxygen? Yes     Baseline Oxygen Saturation % 98 %     1 Minute Oxygen Saturation % 96 %     1 Minute Liters of Oxygen 0 L     2  Minute Oxygen Saturation % 95 %     2 Minute Liters of Oxygen 0 L     3 Minute Oxygen Saturation % 96 %     3 Minute Liters of Oxygen 0 L     4 Minute Oxygen Saturation % 95 %     4 Minute Liters of  Oxygen 0 L     5 Minute Oxygen Saturation % 98 %     5 Minute Liters of Oxygen 0 L     6 Minute Oxygen Saturation % 96 %     6 Minute Liters of Oxygen 0 L     2 Minute Post Oxygen Saturation % 95 %     2 Minute Post Liters of Oxygen 0 L              Oxygen Initial Assessment:  Oxygen Initial Assessment - 08/10/21 1044       Initial 6 min Walk   Oxygen Used None             Oxygen Re-Evaluation:  Oxygen Re-Evaluation     Row Name 08/10/21 1044 09/09/21 0902           Program Oxygen Prescription   Program Oxygen Prescription None None        Home Oxygen   Home Oxygen Device None None      Sleep Oxygen Prescription CPAP CPAP      Home Exercise Oxygen Prescription None None      Home Resting Oxygen Prescription None None      Compliance with Home Oxygen Use Yes Yes        Goals/Expected Outcomes   Short Term Goals To learn and exhibit compliance with exercise, home and travel O2 prescription;To learn and understand importance of monitoring SPO2 with pulse oximeter and demonstrate accurate use of the pulse oximeter.;To learn and understand importance of maintaining oxygen saturations>88%;To learn and demonstrate proper pursed lip breathing techniques or other breathing techniques. ;To learn and demonstrate proper use of respiratory medications To learn and exhibit compliance with exercise, home and travel O2 prescription;To learn and understand importance of monitoring SPO2 with pulse oximeter and demonstrate accurate use of the pulse oximeter.;To learn and understand importance of maintaining oxygen saturations>88%;To learn and demonstrate proper pursed lip breathing techniques or other breathing techniques. ;To learn and demonstrate proper use of respiratory medications      Long  Term Goals Exhibits compliance with exercise, home  and travel O2 prescription;Verbalizes importance of monitoring SPO2 with pulse oximeter and return demonstration;Maintenance of O2  saturations>88%;Exhibits proper breathing techniques, such as pursed lip breathing or other method taught during program session;Compliance with respiratory medication;Demonstrates proper use of MDIs Exhibits compliance with exercise, home  and travel O2 prescription;Verbalizes importance of monitoring SPO2 with pulse oximeter and return demonstration;Maintenance of O2 saturations>88%;Exhibits proper breathing techniques, such as pursed lip breathing or other method taught during program session;Compliance with respiratory medication;Demonstrates proper use of MDIs      Goals/Expected Outcomes Compliance and understanding of monitoring oxygen saturation and importance of breathing techniques to decrease shortness of breath. Compliance and understanding of monitoring oxygen saturation and importance of breathing techniques to decrease shortness of breath.               Oxygen Discharge (Final Oxygen Re-Evaluation):  Oxygen Re-Evaluation - 09/09/21 0902       Program Oxygen Prescription   Program Oxygen Prescription None      Home Oxygen   Home Oxygen Device  None    Sleep Oxygen Prescription CPAP    Home Exercise Oxygen Prescription None    Home Resting Oxygen Prescription None    Compliance with Home Oxygen Use Yes      Goals/Expected Outcomes   Short Term Goals To learn and exhibit compliance with exercise, home and travel O2 prescription;To learn and understand importance of monitoring SPO2 with pulse oximeter and demonstrate accurate use of the pulse oximeter.;To learn and understand importance of maintaining oxygen saturations>88%;To learn and demonstrate proper pursed lip breathing techniques or other breathing techniques. ;To learn and demonstrate proper use of respiratory medications    Long  Term Goals Exhibits compliance with exercise, home  and travel O2 prescription;Verbalizes importance of monitoring SPO2 with pulse oximeter and return demonstration;Maintenance of O2  saturations>88%;Exhibits proper breathing techniques, such as pursed lip breathing or other method taught during program session;Compliance with respiratory medication;Demonstrates proper use of MDIs    Goals/Expected Outcomes Compliance and understanding of monitoring oxygen saturation and importance of breathing techniques to decrease shortness of breath.             Initial Exercise Prescription:  Initial Exercise Prescription - 08/03/21 1300       Date of Initial Exercise RX and Referring Provider   Date 08/03/21    Referring Provider [name]    Expected Discharge Date 10/08/21      NuStep   Level 1    SPM 40    Minutes 20      Prescription Details   Frequency (times per week) 2    Duration Progress to 30 minutes of continuous aerobic without signs/symptoms of physical distress      Intensity   THRR 40-80% of Max Heartrate 66-133    Ratings of Perceived Exertion 11-13    Perceived Dyspnea 0-4      Progression   Progression Continue to progress workloads to maintain intensity without signs/symptoms of physical distress.      Resistance Training   Training Prescription Yes    Weight Red bands    Reps 10-15             Perform Capillary Blood Glucose checks as needed.  Exercise Prescription Changes:   Exercise Prescription Changes     Row Name 08/25/21 1500 09/08/21 1500 09/15/21 1400         Response to Exercise   Blood Pressure (Admit) 162/84 138/72 --     Blood Pressure (Exercise) 140/82 140/72 --     Blood Pressure (Exit) 134/80 124/60 --     Heart Rate (Admit) 68 bpm 60 bpm --     Heart Rate (Exercise) 72 bpm 69 bpm --     Heart Rate (Exit) 61 bpm 59 bpm --     Oxygen Saturation (Admit) 94 % 97 % --     Oxygen Saturation (Exercise) 95 % 96 % --     Oxygen Saturation (Exit) 96 % 95 % --     Rating of Perceived Exertion (Exercise) 13 13 --     Perceived Dyspnea (Exercise) 2 3 --     Duration Progress to 30 minutes of  aerobic without  signs/symptoms of physical distress Continue with 30 min of aerobic exercise without signs/symptoms of physical distress. --     Intensity THRR unchanged THRR unchanged --       Progression   Progression Continue to progress workloads to maintain intensity without signs/symptoms of physical distress. Continue to progress workloads to maintain intensity without signs/symptoms of  physical distress. --       Paramedicesistance Training   Training Prescription Yes Yes --     Weight Red bands Red bands --     Reps 10-15 10-15 --     Time 10 Minutes 10 Minutes --       NuStep   Level 1 2 --     SPM 60 70 --     Minutes 30 20 --     METs 1.8 1.9 --       Track   Laps -- 6 --     Minutes -- 10 --       Home Exercise Plan   Plans to continue exercise at -- -- Home (comment)  walks around department stores     Frequency -- -- Add 1 additional day to program exercise sessions.     Initial Home Exercises Provided -- -- 09/15/21              Exercise Comments:   Exercise Comments     Row Name 08/18/21 1506 09/15/21 1453         Exercise Comments Pt completed his 1st day of execise. He exercised for 30 min on the Nustep and tolerated it well. Carlon averaged 1.8 METs at level 1 on the Nustep. He performed the warmup and cooldown standing without limitations. Rayna SextonRalph is motivated to exercise and wants to improve his functional capacity. Completed home exercise plan. Rayna SextonRalph states that he walks around the deparment stores and when he goes out 2 non-rehab days/wk 15-20 min/day. I am not confident in his current home exercise. I think he walks to the store and sits down while his wife shops. Rayna SextonRalph has stated before that he will sit down and let his wife shop. I encouraged Rayna SextonRalph to walk laps around the store while his wife is shopping. I recommended he increase his time to 30 min/day. I am not sure how motivated Rayna SextonRalph is to exercise on his own. I discussed benefits of walking and pacing himself. Will  continue to follow up with Rayna Sextonalph.               Exercise Goals and Review:   Exercise Goals     Row Name 08/03/21 1142 08/10/21 1043 09/09/21 0854         Exercise Goals   Increase Physical Activity Yes Yes Yes     Intervention Provide advice, education, support and counseling about physical activity/exercise needs.;Develop an individualized exercise prescription for aerobic and resistive training based on initial evaluation findings, risk stratification, comorbidities and participant's personal goals. Provide advice, education, support and counseling about physical activity/exercise needs.;Develop an individualized exercise prescription for aerobic and resistive training based on initial evaluation findings, risk stratification, comorbidities and participant's personal goals. Provide advice, education, support and counseling about physical activity/exercise needs.;Develop an individualized exercise prescription for aerobic and resistive training based on initial evaluation findings, risk stratification, comorbidities and participant's personal goals.     Expected Outcomes Short Term: Attend rehab on a regular basis to increase amount of physical activity.;Long Term: Exercising regularly at least 3-5 days a week.;Long Term: Add in home exercise to make exercise part of routine and to increase amount of physical activity. Short Term: Attend rehab on a regular basis to increase amount of physical activity.;Long Term: Exercising regularly at least 3-5 days a week.;Long Term: Add in home exercise to make exercise part of routine and to increase amount of physical activity. Short Term: Attend rehab on a  regular basis to increase amount of physical activity.;Long Term: Exercising regularly at least 3-5 days a week.;Long Term: Add in home exercise to make exercise part of routine and to increase amount of physical activity.     Increase Strength and Stamina Yes Yes Yes     Intervention Provide advice,  education, support and counseling about physical activity/exercise needs.;Develop an individualized exercise prescription for aerobic and resistive training based on initial evaluation findings, risk stratification, comorbidities and participant's personal goals. Provide advice, education, support and counseling about physical activity/exercise needs.;Develop an individualized exercise prescription for aerobic and resistive training based on initial evaluation findings, risk stratification, comorbidities and participant's personal goals. Provide advice, education, support and counseling about physical activity/exercise needs.;Develop an individualized exercise prescription for aerobic and resistive training based on initial evaluation findings, risk stratification, comorbidities and participant's personal goals.     Expected Outcomes Short Term: Increase workloads from initial exercise prescription for resistance, speed, and METs.;Short Term: Perform resistance training exercises routinely during rehab and add in resistance training at home;Long Term: Improve cardiorespiratory fitness, muscular endurance and strength as measured by increased METs and functional capacity ( ) Short Term: Increase workloads from initial exercise prescription for resistance, speed, and METs.;Short Term: Perform resistance training exercises routinely during rehab and add in resistance training at home;Long Term: Improve cardiorespiratory fitness, muscular endurance and strength as measured by increased METs and functional capacity ( ) Short Term: Increase workloads from initial exercise prescription for resistance, speed, and METs.;Short Term: Perform resistance training exercises routinely during rehab and add in resistance training at home;Long Term: Improve cardiorespiratory fitness, muscular endurance and strength as measured by increased METs and functional capacity ( )     Able to understand and use rate of perceived  exertion (RPE) scale Yes Yes Yes     Intervention Provide education and explanation on how to use RPE scale Provide education and explanation on how to use RPE scale Provide education and explanation on how to use RPE scale     Expected Outcomes Short Term: Able to use RPE daily in rehab to express subjective intensity level;Long Term:  Able to use RPE to guide intensity level when exercising independently Short Term: Able to use RPE daily in rehab to express subjective intensity level;Long Term:  Able to use RPE to guide intensity level when exercising independently Short Term: Able to use RPE daily in rehab to express subjective intensity level;Long Term:  Able to use RPE to guide intensity level when exercising independently     Able to understand and use Dyspnea scale Yes Yes Yes     Intervention Provide education and explanation on how to use Dyspnea scale Provide education and explanation on how to use Dyspnea scale Provide education and explanation on how to use Dyspnea scale     Expected Outcomes Short Term: Able to use Dyspnea scale daily in rehab to express subjective sense of shortness of breath during exertion;Long Term: Able to use Dyspnea scale to guide intensity level when exercising independently Short Term: Able to use Dyspnea scale daily in rehab to express subjective sense of shortness of breath during exertion;Long Term: Able to use Dyspnea scale to guide intensity level when exercising independently Short Term: Able to use Dyspnea scale daily in rehab to express subjective sense of shortness of breath during exertion;Long Term: Able to use Dyspnea scale to guide intensity level when exercising independently     Knowledge and understanding of Target Heart Rate Range (THRR) Yes Yes Yes  Intervention Provide education and explanation of THRR including how the numbers were predicted and where they are located for reference Provide education and explanation of THRR including how the numbers  were predicted and where they are located for reference Provide education and explanation of THRR including how the numbers were predicted and where they are located for reference     Expected Outcomes Short Term: Able to state/look up THRR;Short Term: Able to use daily as guideline for intensity in rehab;Long Term: Able to use THRR to govern intensity when exercising independently Short Term: Able to state/look up THRR;Short Term: Able to use daily as guideline for intensity in rehab;Long Term: Able to use THRR to govern intensity when exercising independently Short Term: Able to state/look up THRR;Short Term: Able to use daily as guideline for intensity in rehab;Long Term: Able to use THRR to govern intensity when exercising independently     Understanding of Exercise Prescription Yes Yes Yes     Intervention Provide education, explanation, and written materials on patient's individual exercise prescription Provide education, explanation, and written materials on patient's individual exercise prescription Provide education, explanation, and written materials on patient's individual exercise prescription     Expected Outcomes Short Term: Able to explain program exercise prescription;Long Term: Able to explain home exercise prescription to exercise independently Short Term: Able to explain program exercise prescription;Long Term: Able to explain home exercise prescription to exercise independently Short Term: Able to explain program exercise prescription;Long Term: Able to explain home exercise prescription to exercise independently              Exercise Goals Re-Evaluation :  Exercise Goals Re-Evaluation     Row Name 08/10/21 1043 09/09/21 0854           Exercise Goal Re-Evaluation   Exercise Goals Review Increase Physical Activity;Increase Strength and Stamina;Able to understand and use rate of perceived exertion (RPE) scale;Able to understand and use Dyspnea scale;Knowledge and understanding  of Target Heart Rate Range (THRR);Understanding of Exercise Prescription Increase Physical Activity;Increase Strength and Stamina;Able to understand and use rate of perceived exertion (RPE) scale;Able to understand and use Dyspnea scale;Knowledge and understanding of Target Heart Rate Range (THRR);Understanding of Exercise Prescription      Comments Rayna SextonRalph is scheduled to begin exercise this weeek. Will monitor and progress as able. Rayna SextonRalph has completed 6 exercise sessions. His initial ExRx included the Nustep for 30 min. Rayna SextonRalph has progressed to the Nustep for 20 min and track walking 10 min. He averages 1.9 METs at level 2 on the Nustep and 6 laps on the track. Rayna SextonRalph is deconditioned as he has to take multiple rest breaks on the track. I have briefly discussed home exercise with Rayna Sextonalph. I encouraged him to walk laps in the store when shopping with his wife. I also mentioned the importance of exercise outside of rehab. He has voiced understanding. Rayna SextonRalph performs the warmup and cooldown standing without limitations. Will continue to monitor and progress as able.      Expected Outcomes Through exercise at rehab and home, the patient will decrease shortness of breath with daily activities and feel confident in carrying out an exercise regimn at home. Through exercise at rehab and home, the patient will decrease shortness of breath with daily activities and feel confident in carrying out an exercise regimn at home.               Discharge Exercise Prescription (Final Exercise Prescription Changes):  Exercise Prescription Changes - 09/15/21 1400  Home Exercise Plan   Plans to continue exercise at Home (comment)   walks around department stores   Frequency Add 1 additional day to program exercise sessions.    Initial Home Exercises Provided 09/15/21             Nutrition:  Target Goals: Understanding of nutrition guidelines, daily intake of sodium 1500mg , cholesterol 200mg , calories 30% from  fat and 7% or less from saturated fats, daily to have 5 or more servings of fruits and vegetables.  Biometrics:  Pre Biometrics - 08/03/21 1058       Pre Biometrics   Grip Strength 29 kg              Nutrition Therapy Plan and Nutrition Goals:   Nutrition Assessments:  MEDIFICTS Score Key: ?70 Need to make dietary changes  40-70 Heart Healthy Diet ? 40 Therapeutic Level Cholesterol Diet   Picture Your Plate Scores: <24 Unhealthy dietary pattern with much room for improvement. 41-50 Dietary pattern unlikely to meet recommendations for good health and room for improvement. 51-60 More healthful dietary pattern, with some room for improvement.  >60 Healthy dietary pattern, although there may be some specific behaviors that could be improved.    Nutrition Goals Re-Evaluation:   Nutrition Goals Discharge (Final Nutrition Goals Re-Evaluation):   Psychosocial: Target Goals: Acknowledge presence or absence of significant depression and/or stress, maximize coping skills, provide positive support system. Participant is able to verbalize types and ability to use techniques and skills needed for reducing stress and depression.  Initial Review & Psychosocial Screening:  Initial Psych Review & Screening - 08/03/21 1125       Initial Review   Current issues with None Identified      Family Dynamics   Good Support System? Yes    Comments wife, 1 child, 3 grandchildren      Barriers   Psychosocial barriers to participate in program There are no identifiable barriers or psychosocial needs.      Screening Interventions   Interventions Encouraged to exercise             Quality of Life Scores:  Scores of 19 and below usually indicate a poorer quality of life in these areas.  A difference of  2-3 points is a clinically meaningful difference.  A difference of 2-3 points in the total score of the Quality of Life Index has been associated with significant improvement in  overall quality of life, self-image, physical symptoms, and general health in studies assessing change in quality of life.  PHQ-9: Recent Review Flowsheet Data     Depression screen [facility] 2/9 08/03/2021   Decreased Interest 0   Down, Depressed, Hopeless 0   PHQ - 2 Score 0   Altered sleeping 0   Tired, decreased energy 0   Change in appetite 0   Feeling bad or failure about yourself  0   Trouble concentrating 0   Moving slowly or fidgety/restless 0   Suicidal thoughts 0   PHQ-9 Score 0   Difficult doing work/chores Not difficult at all      Interpretation of Total Score  Total Score Depression Severity:  1-4 = Minimal depression, 5-9 = Mild depression, 10-14 = Moderate depression, 15-19 = Moderately severe depression, 20-27 = Severe depression   Psychosocial Evaluation and Intervention:   Psychosocial Re-Evaluation:  Psychosocial Re-Evaluation     Row Name 08/13/21 0947 09/16/21 1348           Psychosocial Re-Evaluation  Current issues with None Identified None Identified      Comments Kyros has not been able to attend any exercise sessions due to having a stomach. Emeterio continues with no psychosocial barriers or concerns. He is doing well in  the PR program. He is increasing his workloads and METS levels.      Expected Outcomes Encourage to exercise For him to continue to participate in the PR program with no psychosocial barriers.      Interventions Encouraged to attend Cardiac Rehabilitation for the exercise;Encouraged to attend Pulmonary Rehabilitation for the exercise Encouraged to attend Pulmonary Rehabilitation for the exercise      Continue Psychosocial Services  No Follow up required No Follow up required               Psychosocial Discharge (Final Psychosocial Re-Evaluation):  Psychosocial Re-Evaluation - 09/16/21 1348       Psychosocial Re-Evaluation   Current issues with None Identified    Comments Errick continues with no psychosocial barriers or  concerns. He is doing well in  the PR program. He is increasing his workloads and METS levels.    Expected Outcomes For him to continue to participate in the PR program with no psychosocial barriers.    Interventions Encouraged to attend Pulmonary Rehabilitation for the exercise    Continue Psychosocial Services  No Follow up required             Education: Education Goals: Education classes will be provided on a weekly basis, covering required topics. Participant will state understanding/return demonstration of topics presented.  Learning Barriers/Preferences:  Learning Barriers/Preferences - 08/03/21 1131       Learning Barriers/Preferences   Learning Barriers Sight   wears glasses   Learning Preferences Audio;Computer/Internet;Group Instruction;Individual Instruction;Pictoral;Skilled Demonstration;Verbal Instruction;Video;Written Material             Education Topics: Risk Factor Reduction:  -Group instruction that is supported by a PowerPoint presentation. Instructor discusses the definition of a risk factor, different risk factors for pulmonary disease, and how the heart and lungs work together.     Nutrition for Pulmonary Patient:  -Group instruction provided by PowerPoint slides, verbal discussion, and written materials to support subject matter. The instructor gives an explanation and review of healthy diet recommendations, which includes a discussion on weight management, recommendations for fruit and vegetable consumption, as well as protein, fluid, caffeine, fiber, sodium, sugar, and alcohol. Tips for eating when patients are short of breath are discussed.   Pursed Lip Breathing:  -Group instruction that is supported by demonstration and informational handouts. Instructor discusses the benefits of pursed lip and diaphragmatic breathing and detailed demonstration on how to preform both.     Oxygen Safety:  -Group instruction provided by PowerPoint, verbal  discussion, and written material to support subject matter. There is an overview of What is Oxygen and Why do we need it.  Instructor also reviews how to create a safe environment for oxygen use, the importance of using oxygen as prescribed, and the risks of noncompliance. There is a brief discussion on traveling with oxygen and resources the patient may utilize.   Oxygen Equipment:  -Group instruction provided by [facility] Staff utilizing handouts, written materials, and equipment demonstrations.   Signs and Symptoms:  -Group instruction provided by written material and verbal discussion to support subject matter. Warning signs and symptoms of infection, stroke, and heart attack are reviewed and when to call the physician/911 reinforced. Tips for preventing the spread of infection discussed.  Advanced Directives:  -Group instruction provided by verbal instruction and written material to support subject matter. Instructor reviews Advanced Directive laws and proper instruction for filling out document.   Pulmonary Video:  -Group video education that reviews the importance of medication and oxygen compliance, exercise, good nutrition, pulmonary hygiene, and pursed lip and diaphragmatic breathing for the pulmonary patient.   Exercise for the Pulmonary Patient:  -Group instruction that is supported by a PowerPoint presentation. Instructor discusses benefits of exercise, core components of exercise, frequency, duration, and intensity of an exercise routine, importance of utilizing pulse oximetry during exercise, safety while exercising, and options of places to exercise outside of rehab.     Pulmonary Medications:  -Verbally interactive group education provided by instructor with focus on inhaled medications and proper administration.   Anatomy and Physiology of the Respiratory System and Intimacy:  -Group instruction provided by PowerPoint, verbal discussion, and written material to  support subject matter. Instructor reviews respiratory cycle and anatomical components of the respiratory system and their functions. Instructor also reviews differences in obstructive and restrictive respiratory diseases with examples of each. Intimacy, Sex, and Sexuality differences are reviewed with a discussion on how relationships can change when diagnosed with pulmonary disease. Common sexual concerns are reviewed.   MD DAY -A group question and answer session with a medical doctor that allows participants to ask questions that relate to their pulmonary disease state.   OTHER EDUCATION -Group or individual verbal, written, or video instructions that support the educational goals of the pulmonary rehab program. Flowsheet Row PULMONARY REHAB CHRONIC OBSTRUCTIVE PULMONARY DISEASE from 08/20/2021 in [facility] CARDIAC REHAB  Date 09/08/21  Educator Edmund Hilda your numbers]  Instruction Review Code 1- Verbalizes Understanding       Holiday Eating Survival Tips:  -Group instruction provided by PowerPoint slides, verbal discussion, and written materials to support subject matter. The instructor gives patients tips, tricks, and techniques to help them not only survive but enjoy the holidays despite the onslaught of food that accompanies the holidays.   Knowledge Questionnaire Score:  Knowledge Questionnaire Score - 08/03/21 1122       Knowledge Questionnaire Score   Pre Score 16/18             Core Components/Risk Factors/Patient Goals at Admission:  Personal Goals and Risk Factors at Admission - 08/03/21 1133       Core Components/Risk Factors/Patient Goals on Admission    Weight Management Weight Loss    Improve shortness of breath with ADL's Yes    Intervention Provide education, individualized exercise plan and daily activity instruction to help decrease symptoms of SOB with activities of daily living.    Expected Outcomes Short Term: Improve  cardiorespiratory fitness to achieve a reduction of symptoms when performing ADLs;Long Term: Be able to perform more ADLs without symptoms or delay the onset of symptoms    Diabetes Yes    Intervention Provide education about signs/symptoms and action to take for hypo/hyperglycemia.;Provide education about proper nutrition, including hydration, and aerobic/resistive exercise prescription along with prescribed medications to achieve blood glucose in normal ranges: Fasting glucose 65-99 mg/dL    Expected Outcomes Short Term: Participant verbalizes understanding of the signs/symptoms and immediate care of hyper/hypoglycemia, proper foot care and importance of medication, aerobic/resistive exercise and nutrition plan for blood glucose control.;Long Term: Attainment of HbA1C < 7%.             Core Components/Risk Factors/Patient Goals Review:   Goals and Risk  Factor Review     Row Name 08/13/21 0949 09/16/21 1354           Core Components/Risk Factors/Patient Goals Review   Personal Goals Review Weight Management/Obesity;Improve shortness of breath with ADL's;Develop more efficient breathing techniques such as purse lipped breathing and diaphragmatic breathing and practicing self-pacing with activity.;Increase knowledge of respiratory medications and ability to use respiratory devices properly. Weight Management/Obesity;Improve shortness of breath with ADL's;Develop more efficient breathing techniques such as purse lipped breathing and diaphragmatic breathing and practicing self-pacing with activity.;Increase knowledge of respiratory medications and ability to use respiratory devices properly.;Diabetes      Review Aleksandr has not been able to attend any exercsie sessions due to having a stomach virus. Indie's weight has actually gone up since he has been coming to the PR program. He was just on the nu step for his whole time for exercise, but has now transitioned to doing the nu step and the track. He  is now doing 15 minutes at both exercise stations on room air.Marland KitchenHis blood sugars have been from 180-295. His oxygen saturations have been from 95- 98% with reports of SOB being mild to moderately difficult.      Expected Outcomes See initial core components See admission goals For Jailen to continue to be able to progress with his workloads and METS levels. Controlling his SOB and maintaining normal oxygen saturations on room air.               Core Components/Risk Factors/Patient Goals at Discharge (Final Review):   Goals and Risk Factor Review - 09/16/21 1354       Core Components/Risk Factors/Patient Goals Review   Personal Goals Review Weight Management/Obesity;Improve shortness of breath with ADL's;Develop more efficient breathing techniques such as purse lipped breathing and diaphragmatic breathing and practicing self-pacing with activity.;Increase knowledge of respiratory medications and ability to use respiratory devices properly.;Diabetes    Review Flavio's weight has actually gone up since he has been coming to the PR program. He was just on the nu step for his whole time for exercise, but has now transitioned to doing the nu step and the track. He is now doing 15 minutes at both exercise stations on room air.Marland KitchenHis blood sugars have been from 180-295. His oxygen saturations have been from 95- 98% with reports of SOB being mild to moderately difficult.    Expected Outcomes See admission goals For Rik to continue to be able to progress with his workloads and METS levels. Controlling his SOB and maintaining normal oxygen saturations on room air.             ITP Comments:   Comments: ITP REVIEW Pt is making expected progress toward pulmonary rehab goals after completing 7 sessions. Recommend continued exercise, life style modification, education, and utilization of breathing techniques to increase stamina and strength and decrease shortness of breath with exertion.  Dr. Mechele Collin  is Medical Director for Pulmonary Rehab at [facility].

## 2021-09-17 ENCOUNTER — Other Ambulatory Visit: Payer: Self-pay

## 2021-09-17 ENCOUNTER — Encounter (HOSPITAL_COMMUNITY)
Admission: RE | Admit: 2021-09-17 | Discharge: 2021-09-17 | Disposition: A | Discharge status: Home / Self Care | Payer: 59 | Source: Ambulatory Visit | Attending: Pulmonary Disease | Admitting: Pulmonary Disease

## 2021-09-17 DIAGNOSIS — U099 Post covid-19 condition, unspecified: Secondary | ICD-10-CM

## 2021-09-17 DIAGNOSIS — R0609 Other forms of dyspnea: Secondary | ICD-10-CM

## 2021-09-17 NOTE — Progress Notes (Signed)
Daily Session Note  Patient Details  Name: Philip [name] MRN: [id number] Date of Birth: 1965-10-24 Referring Provider:   [name] Pulmonary Rehab Walk Test from 08/03/2021 in [place]  Referring Provider [name]       Encounter Date: 09/17/2021  Check In:  Session Check In - 09/17/21 1420       Check-In   Supervising physician immediately available to respond to emergencies [facility] immediately available    Physician(s) Dr. Florencia Reasons    Location [facility]    Staff Present [name], [name], MS, ACSM-CEP, Exercise Physiologist;[name] BS, ACSM EP-C, Exercise Physiologist    Virtual Visit No    Medication changes reported     No    Fall or balance concerns reported    No    Tobacco Cessation No Change    Warm-up and Cool-down Performed as group-led instruction    Resistance Training Performed Yes    VAD Patient? No    PAD/SET Patient? No      Pain Assessment   Currently in Pain? No/denies    Multiple Pain Sites No             Capillary Blood Glucose: No results found for this or any previous visit (from the past 24 hour(s)).    Social History   Tobacco Use  Smoking Status Never  Smokeless Tobacco Never    Goals Met:  Proper associated with RPD/PD & O2 Sat Independence with exercise equipment Exercise tolerated well No report of concerns or symptoms today Strength training completed today  Goals Unmet:  Not Applicable  Comments: Service time is from 1309 to 1435.    [name] is Medical Director for Pulmonary Rehab at [facility].

## 2021-09-22 ENCOUNTER — Encounter (HOSPITAL_COMMUNITY)
Admission: RE | Admit: 2021-09-22 | Discharge: 2021-09-22 | Disposition: A | Discharge status: Home / Self Care | Payer: 59 | Source: Ambulatory Visit | Attending: Pulmonary Disease | Admitting: Pulmonary Disease

## 2021-09-22 ENCOUNTER — Other Ambulatory Visit: Payer: Self-pay

## 2021-09-22 VITALS — Wt >= 6400 oz

## 2021-09-22 DIAGNOSIS — U099 Post covid-19 condition, unspecified: Secondary | ICD-10-CM

## 2021-09-22 DIAGNOSIS — R0609 Other forms of dyspnea: Secondary | ICD-10-CM | POA: Diagnosis not present

## 2021-09-22 LAB — HEMOGLOBIN A1C: Hemoglobin A1C: 10.6

## 2021-09-22 LAB — BASIC METABOLIC PANEL: Glucose: 108

## 2021-09-22 NOTE — Progress Notes (Signed)
Daily Session Note  Patient Details  Name: TAYRON HUNNELL MRN: [id number] Date of Birth: Aug 25, 1966 Referring Provider:   [name] Pulmonary Rehab Walk Test from 08/03/2021 in [place]  Referring Provider [name]       Encounter Date: 09/22/2021  Check In:  Session Check In - 09/22/21 1423       Check-In   Supervising physician immediately available to respond to emergencies [facility] immediately available    Physician(s) Dr. Cyndia Skeeters    Location [facility]    Staff Present [name], RN, Quentin Ore, MS, ACSM-CEP, Exercise Physiologist;Kolbe Delmonaco Ysidro Evert, RN    Virtual Visit No    Medication changes reported     No    Fall or balance concerns reported    No    Tobacco Cessation No Change    Warm-up and Cool-down Performed as group-led instruction    Resistance Training Performed Yes    VAD Patient? No    PAD/SET Patient? No      Pain Assessment   Currently in Pain? No/denies    Multiple Pain Sites No             Capillary Blood Glucose: No results found for this or any previous visit (from the past 24 hour(s)).  POCT Glucose - 09/22/21 1503       POCT Blood Glucose   Pre-Exercise 202 mg/dL    Post-Exercise 162 mg/dL             Exercise Prescription Changes - 09/22/21 1500       Response to Exercise   Blood Pressure (Admit) 150/84    Blood Pressure (Exercise) 144/80    Blood Pressure (Exit) 158/80    Heart Rate (Admit) 65 bpm    Heart Rate (Exercise) 96 bpm    Heart Rate (Exit) 74 bpm    Oxygen Saturation (Admit) 97 %    Oxygen Saturation (Exercise) 95 %    Oxygen Saturation (Exit) 97 %    Rating of Perceived Exertion (Exercise) 13    Perceived Dyspnea (Exercise) 3    Duration Continue with 30 min of aerobic exercise without signs/symptoms of physical distress.    Intensity THRR unchanged      Progression   Progression Continue to progress workloads to maintain intensity without  signs/symptoms of physical distress.      Resistance Training   Training Prescription Yes    Weight Blue bands    Reps 10-15    Time 10 Minutes      NuStep   Level 3    SPM 70    Minutes 20    METs 1.9      Track   Laps 9    Minutes 10             Social History   Tobacco Use  Smoking Status Never  Smokeless Tobacco Never    Goals Met:  Exercise tolerated well No report of concerns or symptoms today Strength training completed today  Goals Unmet:  Not Applicable  Comments: Service time is from 1310 to 1438    [name] is Medical Director for Pulmonary Rehab at [facility].

## 2021-09-23 ENCOUNTER — Telehealth: Payer: Self-pay | Admitting: Pulmonary Disease

## 2021-09-23 DIAGNOSIS — J4540 Moderate persistent asthma, uncomplicated: Secondary | ICD-10-CM

## 2021-09-23 MED ORDER — ALBUTEROL SULFATE HFA 108 (90 BASE) MCG/ACT IN AERS: 2.0000 | INHALATION_SPRAY | RESPIRATORY_TRACT | 2 refills | Status: DC | PRN

## 2021-09-23 NOTE — Telephone Encounter (Signed)
Refill sent.   Call made to patient, confirmed DOB. Made aware refill has been sent.   Nothing further needed at this time.

## 2021-09-24 ENCOUNTER — Other Ambulatory Visit: Payer: Self-pay

## 2021-09-24 ENCOUNTER — Encounter (HOSPITAL_COMMUNITY)
Admission: RE | Admit: 2021-09-24 | Discharge: 2021-09-24 | Disposition: A | Discharge status: Home / Self Care | Payer: 59 | Source: Ambulatory Visit | Attending: Pulmonary Disease | Admitting: Pulmonary Disease

## 2021-09-24 DIAGNOSIS — R0609 Other forms of dyspnea: Secondary | ICD-10-CM

## 2021-09-24 DIAGNOSIS — U099 Post covid-19 condition, unspecified: Secondary | ICD-10-CM

## 2021-09-24 NOTE — Progress Notes (Signed)
Daily Session Note  Patient Details  Name: Philip [name] MRN: [id number] Date of Birth: 03/05/1966 Referring Provider:   [name] Pulmonary Rehab Walk Test from 08/03/2021 in [place]  Referring Provider [name]       Encounter Date: 09/24/2021  Check In:  Session Check In - 09/24/21 1410       Check-In   Supervising physician immediately available to respond to emergencies [facility] immediately available    Physician(s) Dr. Starla Link    Location [facility]    Staff Present [name], [name], MS, ACSM-CEP, Exercise Physiologist    Virtual Visit No    Medication changes reported     No    Fall or balance concerns reported    No    Tobacco Cessation No Change    Warm-up and Cool-down Performed as group-led instruction    Resistance Training Performed Yes    VAD Patient? No    PAD/SET Patient? No      Pain Assessment   Currently in Pain? No/denies    Multiple Pain Sites No             Capillary Blood Glucose: No results found for this or any previous visit (from the past 24 hour(s)).    Social History   Tobacco Use  Smoking Status Never  Smokeless Tobacco Never    Goals Met:  Exercise tolerated well No report of concerns or symptoms today Strength training completed today  Goals Unmet:  Not Applicable  Comments: Service time is from 1321  to 1430    [name] is Medical Director for Pulmonary Rehab at [facility].

## 2021-09-29 ENCOUNTER — Encounter (HOSPITAL_COMMUNITY)
Admission: RE | Admit: 2021-09-29 | Discharge: 2021-09-29 | Disposition: A | Discharge status: Home / Self Care | Payer: 59 | Source: Ambulatory Visit | Attending: Pulmonary Disease | Admitting: Pulmonary Disease

## 2021-09-29 ENCOUNTER — Other Ambulatory Visit: Payer: Self-pay

## 2021-09-29 DIAGNOSIS — R0609 Other forms of dyspnea: Secondary | ICD-10-CM | POA: Diagnosis not present

## 2021-09-29 DIAGNOSIS — U099 Post covid-19 condition, unspecified: Secondary | ICD-10-CM

## 2021-09-29 LAB — GLUCOSE, CAPILLARY
Glucose-Capillary: 288 mg/dL — ABNORMAL HIGH (ref 70–99)
Glucose-Capillary: 231 mg/dL — ABNORMAL HIGH (ref 70–99)

## 2021-09-29 NOTE — Telephone Encounter (Signed)
I have not received forms for this patient. I have tried several times to call for the company to refax the forms and have not been able to get through.   Will close encounter.

## 2021-09-29 NOTE — Progress Notes (Signed)
Daily Session Note  Patient Details  Name: SUSUMU HACKLER MRN: [id number] Date of Birth: September 22, 1965 Referring Provider:   [name] Pulmonary Rehab Walk Test from 08/03/2021 in [place]  Referring Provider [name]       Encounter Date: 09/29/2021  Check In:  Session Check In - 09/29/21 1424       Check-In   Supervising physician immediately available to respond to emergencies [facility] immediately available    Physician(s) Dr. Tana Coast    [facility]    Staff Present Elmon Else, MS, ACSM-CEP, Exercise Physiologist;Edmond Ginsberg Ysidro Evert, RN    Virtual Visit No    Medication changes reported     No    Fall or balance concerns reported    No    Tobacco Cessation No Change    Warm-up and Cool-down Performed as group-led instruction    Resistance Training Performed Yes    VAD Patient? No    PAD/SET Patient? No      Pain Assessment   Currently in Pain? No/denies    Multiple Pain Sites No             Capillary Blood Glucose: Results for orders placed or performed during the hospital encounter of 09/29/21 (from the past 24 hour(s))  Glucose, capillary     Status: Abnormal   Collection Time: 09/29/21  2:14 PM  Result Value Ref Range   Glucose-Capillary 231 (H) 70 - 99 mg/dL      Social History   Tobacco Use  Smoking Status Never  Smokeless Tobacco Never    Goals Met:  Exercise tolerated well No report of concerns or symptoms today Strength training completed today  Goals Unmet:  Not Applicable  Comments: Service time is from 1317 to 1440    [name] is Medical Director for Pulmonary Rehab at [facility].

## 2021-10-01 ENCOUNTER — Other Ambulatory Visit: Payer: Self-pay

## 2021-10-01 ENCOUNTER — Encounter (HOSPITAL_COMMUNITY)
Admission: RE | Admit: 2021-10-01 | Discharge: 2021-10-01 | Disposition: A | Discharge status: Home / Self Care | Payer: 59 | Source: Ambulatory Visit | Attending: Pulmonary Disease | Admitting: Pulmonary Disease

## 2021-10-01 DIAGNOSIS — R0609 Other forms of dyspnea: Secondary | ICD-10-CM | POA: Diagnosis not present

## 2021-10-01 DIAGNOSIS — U099 Post covid-19 condition, unspecified: Secondary | ICD-10-CM

## 2021-10-01 NOTE — Progress Notes (Signed)
Daily Session Note  Patient Details  Name: Philip [name] MRN: [id number] Date of Birth: Oct 14, 1965 Referring Provider:   [name] Pulmonary Rehab Walk Test from 08/03/2021 in [place]  Referring Provider [name]       Encounter Date: 10/01/2021  Check In:  Session Check In - 10/01/21 1503       Check-In   Supervising physician immediately available to respond to emergencies [facility] immediately available    Physician(s) [name]    [facility]    Staff Present [name], RN, BSN;[name], [name], RN, MHA    Virtual Visit No    Medication changes reported     No    Fall or balance concerns reported    No    Tobacco Cessation No Change    Warm-up and Cool-down Performed as group-led instruction    Resistance Training Performed Yes    VAD Patient? No    PAD/SET Patient? No      Pain Assessment   Currently in Pain? No/denies    Multiple Pain Sites No             Capillary Blood Glucose: No results found for this or any previous visit (from the past 24 hour(s)).    Social History   Tobacco Use  Smoking Status Never  Smokeless Tobacco Never    Goals Met:  Proper associated with RPD/PD & O2 Sat Exercise tolerated well No report of concerns or symptoms today Strength training completed today  Goals Unmet:  Not Applicable  Comments: Service time is from 1315 to 1430    [name] is Medical Director for Pulmonary Rehab at [facility].

## 2021-10-05 ENCOUNTER — Telehealth (HOSPITAL_COMMUNITY): Payer: Self-pay | Admitting: Internal Medicine

## 2021-10-06 ENCOUNTER — Encounter (HOSPITAL_COMMUNITY): Payer: 59

## 2021-10-08 ENCOUNTER — Other Ambulatory Visit: Payer: Self-pay

## 2021-10-08 ENCOUNTER — Encounter (HOSPITAL_COMMUNITY)
Admission: RE | Admit: 2021-10-08 | Discharge: 2021-10-08 | Disposition: A | Discharge status: Home / Self Care | Payer: 59 | Source: Ambulatory Visit | Attending: Pulmonary Disease | Admitting: Pulmonary Disease

## 2021-10-08 DIAGNOSIS — R0609 Other forms of dyspnea: Secondary | ICD-10-CM | POA: Diagnosis not present

## 2021-10-08 DIAGNOSIS — U099 Post covid-19 condition, unspecified: Secondary | ICD-10-CM

## 2021-10-08 NOTE — Progress Notes (Signed)
Daily Session Note  Patient Details  Name: Philip [name] MRN: [id number] Date of Birth: 12-19-1965 Referring Provider:   [name] Pulmonary Rehab Walk Test from 08/03/2021 in [name]  Referring Provider [name]       Encounter Date: 10/08/2021  Check In:  Session Check In - 10/08/21 1510       Check-In   Supervising physician immediately available to respond to emergencies [facility] immediately available    Physician(s) Dr. Avon Gully    [facility]    Staff Present Elmon Else, MS, ACSM-CEP, Exercise Physiologist;Lisa Lizbeth Bark, MS, ACSM-CEP, CCRP, Exercise Physiologist    Virtual Visit No    Medication changes reported     No    Fall or balance concerns reported    No    Tobacco Cessation No Change    Warm-up and Cool-down Performed as group-led instruction    Resistance Training Performed Yes    VAD Patient? No    PAD/SET Patient? No      Pain Assessment   Currently in Pain? No/denies    Multiple Pain Sites No             Capillary Blood Glucose: No results found for this or any previous visit (from the past 24 hour(s)).    Social History   Tobacco Use  Smoking Status Never  Smokeless Tobacco Never    Goals Met:  Proper associated with RPD/PD & O2 Sat Independence with exercise equipment Exercise tolerated well No report of concerns or symptoms today Strength training completed today  Goals Unmet:  Not Applicable  Comments: Service time is from 1320 to 1430.    [name] is Medical Director for Pulmonary Rehab at [facility].

## 2021-10-13 ENCOUNTER — Other Ambulatory Visit: Payer: Self-pay

## 2021-10-13 ENCOUNTER — Encounter (HOSPITAL_COMMUNITY)
Admission: RE | Admit: 2021-10-13 | Discharge: 2021-10-13 | Disposition: A | Discharge status: Home / Self Care | Payer: 59 | Source: Ambulatory Visit | Attending: Pulmonary Disease | Admitting: Pulmonary Disease

## 2021-10-13 DIAGNOSIS — R0609 Other forms of dyspnea: Secondary | ICD-10-CM

## 2021-10-13 DIAGNOSIS — U099 Post covid-19 condition, unspecified: Secondary | ICD-10-CM

## 2021-10-13 LAB — GLUCOSE, CAPILLARY
Glucose-Capillary: 279 mg/dL — ABNORMAL HIGH (ref 70–99)
Glucose-Capillary: 231 mg/dL — ABNORMAL HIGH (ref 70–99)

## 2021-10-13 NOTE — Progress Notes (Signed)
Daily Session Note  Patient Details  Name: Philip [name] MRN: [id number] Date of Birth: 1965/10/16 Referring Provider:   [name] Pulmonary Rehab Walk Test from 08/03/2021 in [place]  Referring Provider [name]       Encounter Date: 10/13/2021  Check In:  Session Check In - 10/13/21 1502       Check-In   Supervising physician immediately available to respond to emergencies [facility] immediately available    Physician(s) Dr. Avon Gully    Location [facility]    Staff Present [name], RN, BSN;Bella Brummet Ysidro Evert, RN;Olinty Celesta Aver, MS, ACSM CEP, Exercise Physiologist    Virtual Visit No    Medication changes reported     No    Fall or balance concerns reported    No    Tobacco Cessation No Change    Warm-up and Cool-down Performed as group-led instruction    Resistance Training Performed Yes    VAD Patient? No    PAD/SET Patient? No      Pain Assessment   Currently in Pain? No/denies    Multiple Pain Sites No             Capillary Blood Glucose: Results for orders placed or performed during the hospital encounter of 10/13/21 (from the past 24 hour(s))  Glucose, capillary     Status: Abnormal   Collection Time: 10/13/21  2:33 PM  Result Value Ref Range   Glucose-Capillary 231 (H) 70 - 99 mg/dL      Social History   Tobacco Use  Smoking Status Never  Smokeless Tobacco Never    Goals Met:  Exercise tolerated well No report of concerns or symptoms today Strength training completed today  Goals Unmet:  Not Applicable  Comments: Service time is from 1322 to 1440    [name] is Medical Director for Pulmonary Rehab at [facility].

## 2021-10-14 NOTE — Progress Notes (Signed)
Pulmonary Individual Treatment Plan  Patient Details  Name: Philip [name] MRN: [id number] Date of Birth: 10-12-1965 Referring Provider:   [name] Pulmonary Rehab Walk Test from 08/03/2021 in [place]  Referring Provider [place]       Initial Encounter Date:  Flowsheet Row Pulmonary Rehab Walk Test from 08/03/2021 in [place]  Date 08/03/21       Visit Diagnosis: Dyspnea on exertion  Post covid-19 condition, unspecified  Patient's Home Medications on Admission:   Current Outpatient Medications:    albuterol (VENTOLIN HFA) 108 (90 Base) MCG/ACT inhaler, Inhale 2 puffs into the lungs every 4 (four) hours as needed for wheezing or shortness of breath., Disp: 3 each, Rfl: 2   atenolol (TENORMIN) 50 MG tablet, Take 50 mg by mouth daily., Disp: , Rfl:    atorvastatin (LIPITOR) 20 MG tablet, Take 20 mg by mouth daily., Disp: , Rfl:    FARXIGA 10 MG TABS tablet, Take 10 mg by mouth daily., Disp: , Rfl:    Fluticasone-Umeclidin-Vilant (TRELEGY ELLIPTA) 100-62.5-25 MCG/ACT AEPB, Inhale 1 puff into the lungs daily., Disp: 28 each, Rfl: 6   furosemide (LASIX) 20 MG tablet, Take 1 tablet (20 mg total) by mouth daily., Disp: 30 tablet, Rfl: 6   glyBURIDE (DIABETA) 5 MG tablet, Take 10 mg by mouth 2 (two) times daily., Disp: , Rfl:    loratadine (CLARITIN) 10 MG tablet, Take 10 mg by mouth daily., Disp: , Rfl:    losartan-hydrochlorothiazide (HYZAAR) 50-12.5 MG tablet, Take 1 tablet by mouth daily., Disp: , Rfl:    pioglitazone (ACTOS) 45 MG tablet, Take 45 mg by mouth daily., Disp: , Rfl:   Past Medical History: Past Medical History:  Diagnosis Date   Diabetes mellitus without complication ([place])    High cholesterol    Hypertension     Tobacco Use: Social History   Tobacco Use  Smoking Status Never  Smokeless Tobacco Never    Labs: Recent Review Flowsheet Data     Labs for ITP Cardiac and Pulmonary Rehab  Latest Ref Rng & Units 11/01/2008   TCO2 0 - 100 mmol/L 24       Capillary Blood Glucose: Lab Results  Component Value Date   GLUCAP 231 (H) 10/13/2021   GLUCAP 279 (H) 10/13/2021   GLUCAP 231 (H) 09/29/2021   GLUCAP 288 (H) 09/29/2021   GLUCAP 171 (H) 09/15/2021    POCT Glucose     Row Name 08/25/21 1511 09/08/21 1541 09/22/21 1503         POCT Blood Glucose   Pre-Exercise 295 mg/dL 244 mg/dL 202 mg/dL     Post-Exercise 209 mg/dL 180 mg/dL 162 mg/dL              Pulmonary Assessment Scores:  Pulmonary Assessment Scores     Row Name 08/03/21 1128 09/22/21 1638       ADL UCSD   ADL Phase Entry Exit    SOB Score total 85 85      CAT Score   CAT Score 29 29      mMRC Score   mMRC Score 4 --            UCSD: Self-administered rating of dyspnea associated with activities of daily living (ADLs) 6-point scale (0 = "not at all" to 5 = "maximal or unable to do because of breathlessness")  Scoring Scores range from 0 to 120.  Minimally important difference is 5 units  CAT: CAT can identify the health impairment of COPD patients and is better correlated with disease progression.  CAT has a scoring range of zero to 40. The CAT score is classified into four groups of low (less than 10), medium (10 - 20), high (21-30) and very high (31-40) based on the impact level of disease on health status. A CAT score over 10 suggests significant symptoms.  A worsening CAT score could be explained by an exacerbation, poor medication adherence, poor inhaler technique, or progression of COPD or comorbid conditions.  CAT MCID is 2 points  mMRC: mMRC (Modified Medical Research Council) Dyspnea Scale is used to assess the degree of baseline functional disability in patients of respiratory disease due to dyspnea. No minimal important difference is established. A decrease in score of 1 point or greater is considered a positive change.   Pulmonary Function Assessment:  Pulmonary  Function Assessment - 08/03/21 1143       Breath   Bilateral Breath Sounds Clear    Shortness of Breath Yes;Limiting activity             Exercise Target Goals: Exercise Program Goal: Individual exercise prescription set using results from initial 6 min walk test and THRR while considering  patients activity barriers and safety.   Exercise Prescription Goal: Initial exercise prescription builds to 30-45 minutes a day of aerobic activity, 2-3 days per week.  Home exercise guidelines will be given to patient during program as part of exercise prescription that the participant will acknowledge.  Activity Barriers & Risk Stratification:  Activity Barriers & Cardiac Risk Stratification - 08/03/21 1118       Activity Barriers & Cardiac Risk Stratification   Activity Barriers Shortness of Breath;Deconditioning   knee surgery   Cardiac Risk Stratification Low             6 Minute Walk:  6 Minute Walk     Row Name 08/03/21 1337         6 Minute Walk   Phase Initial     Distance 340 feet     Walk Time 6 minutes     # of Rest Breaks 3  0:58-1:34, 2:45-3:18, 4:14-4:37     MPH 0.64     METS 0.1     RPE 14     Perceived Dyspnea  3.5     VO2 Peak 0.36     Symptoms No     Resting HR 51 bpm     Resting BP 145/84     Resting Oxygen Saturation  98 %     Exercise Oxygen Saturation  during 6 min walk 95 %     Max Ex. HR 67 bpm     Max Ex. BP 164/92     2 Minute Post BP 135/74       Interval HR   1 Minute HR 63     2 Minute HR 61     3 Minute HR 62     4 Minute HR 59     5 Minute HR 63     6 Minute HR 67     2 Minute Post HR 64     Interval Heart Rate? Yes       Interval Oxygen   Interval Oxygen? Yes     Baseline Oxygen Saturation % 98 %     1 Minute Oxygen Saturation % 96 %     1 Minute Liters of Oxygen 0 L  2 Minute Oxygen Saturation % 95 %     2 Minute Liters of Oxygen 0 L     3 Minute Oxygen Saturation % 96 %     3 Minute Liters of Oxygen 0 L     4  Minute Oxygen Saturation % 95 %     4 Minute Liters of Oxygen 0 L     5 Minute Oxygen Saturation % 98 %     5 Minute Liters of Oxygen 0 L     6 Minute Oxygen Saturation % 96 %     6 Minute Liters of Oxygen 0 L     2 Minute Post Oxygen Saturation % 95 %     2 Minute Post Liters of Oxygen 0 L              Oxygen Initial Assessment:  Oxygen Initial Assessment - 08/10/21 1044       Initial 6 min Walk   Oxygen Used None             Oxygen Re-Evaluation:  Oxygen Re-Evaluation     Row Name 08/10/21 1044 09/09/21 0902 10/07/21 1120         Program Oxygen Prescription   Program Oxygen Prescription None None None       Home Oxygen   Home Oxygen Device None None None     Sleep Oxygen Prescription CPAP CPAP CPAP     Home Exercise Oxygen Prescription None None None     Home Resting Oxygen Prescription None None None     Compliance with Home Oxygen Use Yes Yes Yes       Goals/Expected Outcomes   Short Term Goals To learn and exhibit compliance with exercise, home and travel O2 prescription;To learn and understand importance of monitoring SPO2 with pulse oximeter and demonstrate accurate use of the pulse oximeter.;To learn and understand importance of maintaining oxygen saturations>88%;To learn and demonstrate proper pursed lip breathing techniques or other breathing techniques. ;To learn and demonstrate proper use of respiratory medications To learn and exhibit compliance with exercise, home and travel O2 prescription;To learn and understand importance of monitoring SPO2 with pulse oximeter and demonstrate accurate use of the pulse oximeter.;To learn and understand importance of maintaining oxygen saturations>88%;To learn and demonstrate proper pursed lip breathing techniques or other breathing techniques. ;To learn and demonstrate proper use of respiratory medications To learn and exhibit compliance with exercise, home and travel O2 prescription;To learn and understand importance of  monitoring SPO2 with pulse oximeter and demonstrate accurate use of the pulse oximeter.;To learn and understand importance of maintaining oxygen saturations>88%;To learn and demonstrate proper pursed lip breathing techniques or other breathing techniques. ;To learn and demonstrate proper use of respiratory medications     Long  Term Goals Exhibits compliance with exercise, home  and travel O2 prescription;Verbalizes importance of monitoring SPO2 with pulse oximeter and return demonstration;Maintenance of O2 saturations>88%;Exhibits proper breathing techniques, such as pursed lip breathing or other method taught during program session;Compliance with respiratory medication;Demonstrates proper use of MDIs Exhibits compliance with exercise, home  and travel O2 prescription;Verbalizes importance of monitoring SPO2 with pulse oximeter and return demonstration;Maintenance of O2 saturations>88%;Exhibits proper breathing techniques, such as pursed lip breathing or other method taught during program session;Compliance with respiratory medication;Demonstrates proper use of MDIs Exhibits compliance with exercise, home  and travel O2 prescription;Verbalizes importance of monitoring SPO2 with pulse oximeter and return demonstration;Maintenance of O2 saturations>88%;Exhibits proper breathing techniques, such as pursed lip breathing or other method  taught during program session;Compliance with respiratory medication;Demonstrates proper use of MDIs     Goals/Expected Outcomes Compliance and understanding of monitoring oxygen saturation and importance of breathing techniques to decrease shortness of breath. Compliance and understanding of monitoring oxygen saturation and importance of breathing techniques to decrease shortness of breath. Compliance and understanding of monitoring oxygen saturation and importance of breathing techniques to decrease shortness of breath.              Oxygen Discharge (Final Oxygen  Re-Evaluation):  Oxygen Re-Evaluation - 10/07/21 1120       Program Oxygen Prescription   Program Oxygen Prescription None      Home Oxygen   Home Oxygen Device None    Sleep Oxygen Prescription CPAP    Home Exercise Oxygen Prescription None    Home Resting Oxygen Prescription None    Compliance with Home Oxygen Use Yes      Goals/Expected Outcomes   Short Term Goals To learn and exhibit compliance with exercise, home and travel O2 prescription;To learn and understand importance of monitoring SPO2 with pulse oximeter and demonstrate accurate use of the pulse oximeter.;To learn and understand importance of maintaining oxygen saturations>88%;To learn and demonstrate proper pursed lip breathing techniques or other breathing techniques. ;To learn and demonstrate proper use of respiratory medications    Long  Term Goals Exhibits compliance with exercise, home  and travel O2 prescription;Verbalizes importance of monitoring SPO2 with pulse oximeter and return demonstration;Maintenance of O2 saturations>88%;Exhibits proper breathing techniques, such as pursed lip breathing or other method taught during program session;Compliance with respiratory medication;Demonstrates proper use of MDIs    Goals/Expected Outcomes Compliance and understanding of monitoring oxygen saturation and importance of breathing techniques to decrease shortness of breath.             Initial Exercise Prescription:  Initial Exercise Prescription - 08/03/21 1300       Date of Initial Exercise RX and Referring Provider   Date 08/03/21    Referring Provider [name]    Expected Discharge Date 10/08/21      NuStep   Level 1    SPM 40    Minutes 20      Prescription Details   Frequency (times per week) 2    Duration Progress to 30 minutes of continuous aerobic without signs/symptoms of physical distress      Intensity   THRR 40-80% of Max Heartrate 66-133    Ratings of Perceived Exertion 11-13    Perceived  Dyspnea 0-4      Progression   Progression Continue to progress workloads to maintain intensity without signs/symptoms of physical distress.      Resistance Training   Training Prescription Yes    Weight Red bands    Reps 10-15             Perform Capillary Blood Glucose checks as needed.  Exercise Prescription Changes:   Exercise Prescription Changes     Row Name 08/25/21 1500 09/08/21 1500 09/15/21 1400 09/22/21 1500       Response to Exercise   Blood Pressure (Admit) 162/84 138/72 -- 150/84    Blood Pressure (Exercise) 140/82 140/72 -- 144/80    Blood Pressure (Exit) 134/80 124/60 -- 158/80    Heart Rate (Admit) 68 bpm 60 bpm -- 65 bpm    Heart Rate (Exercise) 72 bpm 69 bpm -- 96 bpm    Heart Rate (Exit) 61 bpm 59 bpm -- 74 bpm    Oxygen Saturation (Admit) 94 %  97 % -- 97 %    Oxygen Saturation (Exercise) 95 % 96 % -- 95 %    Oxygen Saturation (Exit) 96 % 95 % -- 97 %    Rating of Perceived Exertion (Exercise) 13 13 -- 13    Perceived Dyspnea (Exercise) 2 3 -- 3    Duration Progress to 30 minutes of  aerobic without signs/symptoms of physical distress Continue with 30 min of aerobic exercise without signs/symptoms of physical distress. -- Continue with 30 min of aerobic exercise without signs/symptoms of physical distress.    Intensity THRR unchanged THRR unchanged -- THRR unchanged      Progression   Progression Continue to progress workloads to maintain intensity without signs/symptoms of physical distress. Continue to progress workloads to maintain intensity without signs/symptoms of physical distress. -- Continue to progress workloads to maintain intensity without signs/symptoms of physical distress.      Resistance Training   Training Prescription Yes Yes -- Yes    Weight Red bands Red bands -- Blue bands    Reps 10-15 10-15 -- 10-15    Time 10 Minutes 10 Minutes -- 10 Minutes      NuStep   Level 1 2 -- 3    SPM 60 70 -- 70    Minutes 30 20 -- 20    METs  1.8 1.9 -- 1.9      Track   Laps -- 6 -- 9    Minutes -- 10 -- 10      Home Exercise Plan   Plans to continue exercise at -- -- Home (comment)  walks around department stores --    Frequency -- -- Add 1 additional day to program exercise sessions. --    Initial Home Exercises Provided -- -- 09/15/21 --             Exercise Comments:   Exercise Comments     Row Name 08/18/21 1506 09/15/21 1453         Exercise Comments Pt completed his 1st day of execise. He exercised for 30 min on the Nustep and tolerated it well. Philip [name] averaged 1.8 METs at level 1 on the Nustep. He performed the warmup and cooldown standing without limitations. Philip [name] is motivated to exercise and wants to improve his functional capacity. Completed home exercise plan. Philip [name] states that he walks around the deparment stores and when he goes out 2 non-rehab days/wk 15-20 min/day. I am not confident in his current home exercise. I think he walks to the store and sits down while his wife shops. Philip [name] has stated before that he will sit down and let his wife shop. I encouraged Philip [name] to walk laps around the store while his wife is shopping. I recommended he increase his time to 30 min/day. I am not sure how motivated Philip [name] is to exercise on his own. I discussed benefits of walking and pacing himself. Will continue to follow up with Philip [name].               Exercise Goals and Review:   Exercise Goals     Row Name 08/03/21 1142 08/10/21 1043 09/09/21 0854 10/07/21 1115       Exercise Goals   Increase Physical Activity Yes Yes Yes Yes    Intervention Provide advice, education, support and counseling about physical activity/exercise needs.;Develop an individualized exercise prescription for aerobic and resistive training based on initial evaluation findings, risk stratification, comorbidities and participant's personal goals. Provide advice, education, support and counseling about  physical activity/exercise needs.;Develop an  individualized exercise prescription for aerobic and resistive training based on initial evaluation findings, risk stratification, comorbidities and participant's personal goals. Provide advice, education, support and counseling about physical activity/exercise needs.;Develop an individualized exercise prescription for aerobic and resistive training based on initial evaluation findings, risk stratification, comorbidities and participant's personal goals. Provide advice, education, support and counseling about physical activity/exercise needs.;Develop an individualized exercise prescription for aerobic and resistive training based on initial evaluation findings, risk stratification, comorbidities and participant's personal goals.    Expected Outcomes Short Term: Attend rehab on a regular basis to increase amount of physical activity.;Long Term: Exercising regularly at least 3-5 days a week.;Long Term: Add in home exercise to make exercise part of routine and to increase amount of physical activity. Short Term: Attend rehab on a regular basis to increase amount of physical activity.;Long Term: Exercising regularly at least 3-5 days a week.;Long Term: Add in home exercise to make exercise part of routine and to increase amount of physical activity. Short Term: Attend rehab on a regular basis to increase amount of physical activity.;Long Term: Exercising regularly at least 3-5 days a week.;Long Term: Add in home exercise to make exercise part of routine and to increase amount of physical activity. Short Term: Attend rehab on a regular basis to increase amount of physical activity.;Long Term: Exercising regularly at least 3-5 days a week.;Long Term: Add in home exercise to make exercise part of routine and to increase amount of physical activity.    Increase Strength and Stamina Yes Yes Yes Yes    Intervention Provide advice, education, support and counseling about physical activity/exercise needs.;Develop an  individualized exercise prescription for aerobic and resistive training based on initial evaluation findings, risk stratification, comorbidities and participant's personal goals. Provide advice, education, support and counseling about physical activity/exercise needs.;Develop an individualized exercise prescription for aerobic and resistive training based on initial evaluation findings, risk stratification, comorbidities and participant's personal goals. Provide advice, education, support and counseling about physical activity/exercise needs.;Develop an individualized exercise prescription for aerobic and resistive training based on initial evaluation findings, risk stratification, comorbidities and participant's personal goals. Provide advice, education, support and counseling about physical activity/exercise needs.;Develop an individualized exercise prescription for aerobic and resistive training based on initial evaluation findings, risk stratification, comorbidities and participant's personal goals.    Expected Outcomes Short Term: Increase workloads from initial exercise prescription for resistance, speed, and METs.;Short Term: Perform resistance training exercises routinely during rehab and add in resistance training at home;Long Term: Improve cardiorespiratory fitness, muscular endurance and strength as measured by increased METs and functional capacity (6MWT) Short Term: Increase workloads from initial exercise prescription for resistance, speed, and METs.;Short Term: Perform resistance training exercises routinely during rehab and add in resistance training at home;Long Term: Improve cardiorespiratory fitness, muscular endurance and strength as measured by increased METs and functional capacity (6MWT) Short Term: Increase workloads from initial exercise prescription for resistance, speed, and METs.;Short Term: Perform resistance training exercises routinely during rehab and add in resistance training at  home;Long Term: Improve cardiorespiratory fitness, muscular endurance and strength as measured by increased METs and functional capacity (6MWT) Short Term: Increase workloads from initial exercise prescription for resistance, speed, and METs.;Short Term: Perform resistance training exercises routinely during rehab and add in resistance training at home;Long Term: Improve cardiorespiratory fitness, muscular endurance and strength as measured by increased METs and functional capacity (6MWT)    Able to understand and use rate of perceived exertion (RPE) scale Yes Yes Yes Yes  Intervention Provide education and explanation on how to use RPE scale Provide education and explanation on how to use RPE scale Provide education and explanation on how to use RPE scale Provide education and explanation on how to use RPE scale    Expected Outcomes Short Term: Able to use RPE daily in rehab to express subjective intensity level;Long Term:  Able to use RPE to guide intensity level when exercising independently Short Term: Able to use RPE daily in rehab to express subjective intensity level;Long Term:  Able to use RPE to guide intensity level when exercising independently Short Term: Able to use RPE daily in rehab to express subjective intensity level;Long Term:  Able to use RPE to guide intensity level when exercising independently Short Term: Able to use RPE daily in rehab to express subjective intensity level;Long Term:  Able to use RPE to guide intensity level when exercising independently    Able to understand and use Dyspnea scale Yes Yes Yes Yes    Intervention Provide education and explanation on how to use Dyspnea scale Provide education and explanation on how to use Dyspnea scale Provide education and explanation on how to use Dyspnea scale Provide education and explanation on how to use Dyspnea scale    Expected Outcomes Short Term: Able to use Dyspnea scale daily in rehab to express subjective sense of shortness  of breath during exertion;Long Term: Able to use Dyspnea scale to guide intensity level when exercising independently Short Term: Able to use Dyspnea scale daily in rehab to express subjective sense of shortness of breath during exertion;Long Term: Able to use Dyspnea scale to guide intensity level when exercising independently Short Term: Able to use Dyspnea scale daily in rehab to express subjective sense of shortness of breath during exertion;Long Term: Able to use Dyspnea scale to guide intensity level when exercising independently Short Term: Able to use Dyspnea scale daily in rehab to express subjective sense of shortness of breath during exertion;Long Term: Able to use Dyspnea scale to guide intensity level when exercising independently    Knowledge and understanding of Target Heart Rate Range (THRR) Yes Yes Yes Yes    Intervention Provide education and explanation of THRR including how the numbers were predicted and where they are located for reference Provide education and explanation of THRR including how the numbers were predicted and where they are located for reference Provide education and explanation of THRR including how the numbers were predicted and where they are located for reference Provide education and explanation of THRR including how the numbers were predicted and where they are located for reference    Expected Outcomes Short Term: Able to state/look up THRR;Short Term: Able to use daily as guideline for intensity in rehab;Long Term: Able to use THRR to govern intensity when exercising independently Short Term: Able to state/look up THRR;Short Term: Able to use daily as guideline for intensity in rehab;Long Term: Able to use THRR to govern intensity when exercising independently Short Term: Able to state/look up THRR;Short Term: Able to use daily as guideline for intensity in rehab;Long Term: Able to use THRR to govern intensity when exercising independently Short Term: Able to  state/look up THRR;Short Term: Able to use daily as guideline for intensity in rehab;Long Term: Able to use THRR to govern intensity when exercising independently    Understanding of Exercise Prescription Yes Yes Yes Yes    Intervention Provide education, explanation, and written materials on patient's individual exercise prescription Provide education, explanation, and written materials  on patient's individual exercise prescription Provide education, explanation, and written materials on patient's individual exercise prescription Provide education, explanation, and written materials on patient's individual exercise prescription    Expected Outcomes Short Term: Able to explain program exercise prescription;Long Term: Able to explain home exercise prescription to exercise independently Short Term: Able to explain program exercise prescription;Long Term: Able to explain home exercise prescription to exercise independently Short Term: Able to explain program exercise prescription;Long Term: Able to explain home exercise prescription to exercise independently Short Term: Able to explain program exercise prescription;Long Term: Able to explain home exercise prescription to exercise independently             Exercise Goals Re-Evaluation :  Exercise Goals Re-Evaluation     Row Name 08/10/21 1043 09/09/21 0854 10/07/21 1115         Exercise Goal Re-Evaluation   Exercise Goals Review Increase Physical Activity;Increase Strength and Stamina;Able to understand and use rate of perceived exertion (RPE) scale;Able to understand and use Dyspnea scale;Knowledge and understanding of Target Heart Rate Range (THRR);Understanding of Exercise Prescription Increase Physical Activity;Increase Strength and Stamina;Able to understand and use rate of perceived exertion (RPE) scale;Able to understand and use Dyspnea scale;Knowledge and understanding of Target Heart Rate Range (THRR);Understanding of Exercise Prescription  Increase Physical Activity;Increase Strength and Stamina;Able to understand and use rate of perceived exertion (RPE) scale;Able to understand and use Dyspnea scale;Knowledge and understanding of Target Heart Rate Range (THRR);Understanding of Exercise Prescription     Comments Philip [name] is scheduled to begin exercise this weeek. Will monitor and progress as able. Jaimes has completed 6 exercise sessions. His initial ExRx included the Nustep for 30 min. Philip [name] has progressed to the Nustep for 20 min and track walking 10 min. He averages 1.9 METs at level 2 on the Nustep and 6 laps on the track. Philip [name] is deconditioned as he has to take multiple rest breaks on the track. I have briefly discussed home exercise with Philip [name]. I encouraged him to walk laps in the store when shopping with his wife. I also mentioned the importance of exercise outside of rehab. He has voiced understanding. Philip [name] performs the warmup and cooldown standing without limitations. Will continue to monitor and progress as able. Philip [name] has completed 12 exercise sessions. He exercises for 15 min on the Nustep and track. He averages 1.9 METs at level 3 on the Nustep and 2.05 METs on the track. He performs the warmup and cooldown mostly standing. He will sit down when we do squats. Philip [name] has increased his workload on the Nustep but averages the same laps on the track. He gets short of breath on the track and has to take multiple rest breaks. I have encouraged Philip [name] to walk at home. I am not sure if or how much he is exercising at home. Will continue to monitor and progress as able.     Expected Outcomes Through exercise at rehab and home, the patient will decrease shortness of breath with daily activities and feel confident in carrying out an exercise regimn at home. Through exercise at rehab and home, the patient will decrease shortness of breath with daily activities and feel confident in carrying out an exercise regimn at home. Through exercise at rehab and  home, the patient will decrease shortness of breath with daily activities and feel confident in carrying out an exercise regimn at home.              Discharge Exercise Prescription (Final Exercise Prescription Changes):  Exercise Prescription Changes - 09/22/21 1500       Response to Exercise   Blood Pressure (Admit) 150/84    Blood Pressure (Exercise) 144/80    Blood Pressure (Exit) 158/80    Heart Rate (Admit) 65 bpm    Heart Rate (Exercise) 96 bpm    Heart Rate (Exit) 74 bpm    Oxygen Saturation (Admit) 97 %    Oxygen Saturation (Exercise) 95 %    Oxygen Saturation (Exit) 97 %    Rating of Perceived Exertion (Exercise) 13    Perceived Dyspnea (Exercise) 3    Duration Continue with 30 min of aerobic exercise without signs/symptoms of physical distress.    Intensity THRR unchanged      Progression   Progression Continue to progress workloads to maintain intensity without signs/symptoms of physical distress.      Resistance Training   Training Prescription Yes    Weight Blue bands    Reps 10-15    Time 10 Minutes      NuStep   Level 3    SPM 70    Minutes 20    METs 1.9      Track   Laps 9    Minutes 10             Nutrition:  Target Goals: Understanding of nutrition guidelines, daily intake of sodium <1561m, cholesterol <2076m calories 30% from fat and 7% or less from saturated fats, daily to have 5 or more servings of fruits and vegetables.  Biometrics:  Pre Biometrics - 08/03/21 1058       Pre Biometrics   Grip Strength 29 kg              Nutrition Therapy Plan and Nutrition Goals:   Nutrition Assessments:  MEDIFICTS Score Key: ?70 Need to make dietary changes  40-70 Heart Healthy Diet ? 40 Therapeutic Level Cholesterol Diet   Picture Your Plate Scores: <4<29nhealthy dietary pattern with much room for improvement. 41-50 Dietary pattern unlikely to meet recommendations for good health and room for improvement. 51-60 More  healthful dietary pattern, with some room for improvement.  >60 Healthy dietary pattern, although there may be some specific behaviors that could be improved.    Nutrition Goals Re-Evaluation:   Nutrition Goals Discharge (Final Nutrition Goals Re-Evaluation):   Psychosocial: Target Goals: Acknowledge presence or absence of significant depression and/or stress, maximize coping skills, provide positive support system. Participant is able to verbalize types and ability to use techniques and skills needed for reducing stress and depression.  Initial Review & Psychosocial Screening:  Initial Psych Review & Screening - 08/03/21 1125       Initial Review   Current issues with None Identified      Family Dynamics   Good Support System? Yes    Comments wife, 1 child, 3 grandchildren      Barriers   Psychosocial barriers to participate in program There are no identifiable barriers or psychosocial needs.      Screening Interventions   Interventions Encouraged to exercise             Quality of Life Scores:  Scores of 19 and below usually indicate a poorer quality of life in these areas.  A difference of  2-3 points is a clinically meaningful difference.  A difference of 2-3 points in the total score of the Quality of Life Index has been associated with significant improvement in overall quality of life, self-image, physical symptoms, and general  health in studies assessing change in quality of life.  PHQ-9: Recent Review Flowsheet Data     Depression screen [facility] 2/9 08/03/2021   Decreased Interest 0   Down, Depressed, Hopeless 0   PHQ - 2 Score 0   Altered sleeping 0   Tired, decreased energy 0   Change in appetite 0   Feeling bad or failure about yourself  0   Trouble concentrating 0   Moving slowly or fidgety/restless 0   Suicidal thoughts 0   PHQ-9 Score 0   Difficult doing work/chores Not difficult at all      Interpretation of Total Score  Total Score Depression  Severity:  1-4 = Minimal depression, 5-9 = Mild depression, 10-14 = Moderate depression, 15-19 = Moderately severe depression, 20-27 = Severe depression   Psychosocial Evaluation and Intervention:   Psychosocial Re-Evaluation:  Psychosocial Re-Evaluation     Row Name 08/13/21 0947 09/16/21 1348 10/08/21 0907         Psychosocial Re-Evaluation   Current issues with None Identified None Identified None Identified     Comments Philip [name] has not been able to attend any exercise sessions due to having a stomach. Kelsie continues with no psychosocial barriers or concerns. He is doing well in  the PR program. He is increasing his workloads and METS levels. Philip [name] continues with no psychosocial issues or barriers. He is doing well with increasing his workloads. His MET levels are staying the same.He has started walking the track and he has been able to increase his laps. He remains on room air for exercise 95-97%.     Expected Outcomes Encourage to exercise For him to continue to participate in the PR program with no psychosocial barriers. For Philip [name] to continue to participate in PR without any psychosocial barrier or concerns.     Interventions Encouraged to attend Cardiac Rehabilitation for the exercise;Encouraged to attend Pulmonary Rehabilitation for the exercise Encouraged to attend Pulmonary Rehabilitation for the exercise Encouraged to attend Pulmonary Rehabilitation for the exercise     Continue Psychosocial Services  No Follow up required No Follow up required No Follow up required              Psychosocial Discharge (Final Psychosocial Re-Evaluation):  Psychosocial Re-Evaluation - 10/08/21 0907       Psychosocial Re-Evaluation   Current issues with None Identified    Comments Philip [name] continues with no psychosocial issues or barriers. He is doing well with increasing his workloads. His MET levels are staying the same.He has started walking the track and he has been able to increase his laps.  He remains on room air for exercise 95-97%.    Expected Outcomes For Philip [name] to continue to participate in PR without any psychosocial barrier or concerns.    Interventions Encouraged to attend Pulmonary Rehabilitation for the exercise    Continue Psychosocial Services  No Follow up required             Education: Education Goals: Education classes will be provided on a weekly basis, covering required topics. Participant will state understanding/return demonstration of topics presented.  Learning Barriers/Preferences:  Learning Barriers/Preferences - 08/03/21 1131       Learning Barriers/Preferences   Learning Barriers Sight   wears glasses   Learning Preferences Audio;Computer/Internet;Group Instruction;Individual Instruction;Pictoral;Skilled Demonstration;Verbal Instruction;Video;Written Material             Education Topics: Risk Factor Reduction:  -Group instruction that is supported by a PowerPoint presentation. Instructor discusses the definition of  a risk factor, different risk factors for pulmonary disease, and how the heart and lungs work together.     Nutrition for Pulmonary Patient:  -Group instruction provided by PowerPoint slides, verbal discussion, and written materials to support subject matter. The instructor gives an explanation and review of healthy diet recommendations, which includes a discussion on weight management, recommendations for fruit and vegetable consumption, as well as protein, fluid, caffeine, fiber, sodium, sugar, and alcohol. Tips for eating when patients are short of breath are discussed. Flowsheet Row PULMONARY REHAB CHRONIC OBSTRUCTIVE PULMONARY DISEASE from 10/08/2021 in [place]  Date 10/01/21  Educator handout       Pursed Lip Breathing:  -Group instruction that is supported by demonstration and informational handouts. Instructor discusses the benefits of pursed lip and diaphragmatic breathing and  detailed demonstration on how to preform both.     Oxygen Safety:  -Group instruction provided by PowerPoint, verbal discussion, and written material to support subject matter. There is an overview of What is Oxygen and Why do we need it.  Instructor also reviews how to create a safe environment for oxygen use, the importance of using oxygen as prescribed, and the risks of noncompliance. There is a brief discussion on traveling with oxygen and resources the patient may utilize.   Oxygen Equipment:  -Group instruction provided by [facility] Staff utilizing handouts, written materials, and equipment demonstrations.   Signs and Symptoms:  -Group instruction provided by written material and verbal discussion to support subject matter. Warning signs and symptoms of infection, stroke, and heart attack are reviewed and when to call the physician/911 reinforced. Tips for preventing the spread of infection discussed.   Advanced Directives:  -Group instruction provided by verbal instruction and written material to support subject matter. Instructor reviews Advanced Directive laws and proper instruction for filling out document.   Pulmonary Video:  -Group video education that reviews the importance of medication and oxygen compliance, exercise, good nutrition, pulmonary hygiene, and pursed lip and diaphragmatic breathing for the pulmonary patient.   Exercise for the Pulmonary Patient:  -Group instruction that is supported by a PowerPoint presentation. Instructor discusses benefits of exercise, core components of exercise, frequency, duration, and intensity of an exercise routine, importance of utilizing pulse oximetry during exercise, safety while exercising, and options of places to exercise outside of rehab.     Pulmonary Medications:  -Verbally interactive group education provided by instructor with focus on inhaled medications and proper administration.   Anatomy and Physiology of the  Respiratory System and Intimacy:  -Group instruction provided by PowerPoint, verbal discussion, and written material to support subject matter. Instructor reviews respiratory cycle and anatomical components of the respiratory system and their functions. Instructor also reviews differences in obstructive and restrictive respiratory diseases with examples of each. Intimacy, Sex, and Sexuality differences are reviewed with a discussion on how relationships can change when diagnosed with pulmonary disease. Common sexual concerns are reviewed.   MD DAY -A group question and answer session with a medical doctor that allows participants to ask questions that relate to their pulmonary disease state.   OTHER EDUCATION -Group or individual verbal, written, or video instructions that support the educational goals of the pulmonary rehab program. [place] from 10/08/2021 in [place]  Date 10/08/21  Educator Philip [name] your numbers]  Instruction Review Code 1- Verbalizes Understanding       Holiday Eating Survival Tips:  -  Group instruction provided by PowerPoint slides, verbal discussion, and written materials to support subject matter. The instructor gives patients tips, tricks, and techniques to help them not only survive but enjoy the holidays despite the onslaught of food that accompanies the holidays.   Knowledge Questionnaire Score:  Knowledge Questionnaire Score - 09/22/21 1638       Knowledge Questionnaire Score   Post Score 18/18             Core Components/Risk Factors/Patient Goals at Admission:  Personal Goals and Risk Factors at Admission - 08/03/21 1133       Core Components/Risk Factors/Patient Goals on Admission    Weight Management Weight Loss    Improve shortness of breath with ADL's Yes    Intervention Provide education, individualized exercise plan and daily activity  instruction to help decrease symptoms of SOB with activities of daily living.    Expected Outcomes Short Term: Improve cardiorespiratory fitness to achieve a reduction of symptoms when performing ADLs;Long Term: Be able to perform more ADLs without symptoms or delay the onset of symptoms    Diabetes Yes    Intervention Provide education about signs/symptoms and action to take for hypo/hyperglycemia.;Provide education about proper nutrition, including hydration, and aerobic/resistive exercise prescription along with prescribed medications to achieve blood glucose in normal ranges: Fasting glucose 65-99 mg/dL    Expected Outcomes Short Term: Participant verbalizes understanding of the signs/symptoms and immediate care of hyper/hypoglycemia, proper foot care and importance of medication, aerobic/resistive exercise and nutrition plan for blood glucose control.;Long Term: Attainment of HbA1C < 7%.             Core Components/Risk Factors/Patient Goals Review:   Goals and Risk Factor Review     Row Name 08/13/21 0949 09/16/21 1354 10/08/21 0916         Core Components/Risk Factors/Patient Goals Review   Personal Goals Review Weight Management/Obesity;Improve shortness of breath with ADL's;Develop more efficient breathing techniques such as purse lipped breathing and diaphragmatic breathing and practicing self-pacing with activity.;Increase knowledge of respiratory medications and ability to use respiratory devices properly. Weight Management/Obesity;Improve shortness of breath with ADL's;Develop more efficient breathing techniques such as purse lipped breathing and diaphragmatic breathing and practicing self-pacing with activity.;Increase knowledge of respiratory medications and ability to use respiratory devices properly.;Diabetes Weight Management/Obesity;Improve shortness of breath with ADL's;Increase knowledge of respiratory medications and ability to use respiratory devices properly.;Develop more  efficient breathing techniques such as purse lipped breathing and diaphragmatic breathing and practicing self-pacing with activity.     Review Philip [name] has not been able to attend any exercsie sessions due to having a stomach virus. Philip [name]'s weight has actually gone up since he has been coming to the PR program. He was just on the nu step for his whole time for exercise, but has now transitioned to doing the nu step and the track. He is now doing 15 minutes at both exercise stations on room air.Marland KitchenHis blood sugars have been from 180-295. His oxygen saturations have been from 95- 98% with reports of SOB being mild to moderately difficult. Philip [name]'s weight has continued to be about the same since he started the PR program. His blood sugars have been from141 to 285. He is exercising on the nu step and the track. He has been consistent in his METS.He states that he is working somewhat hard and his dyspnea is moderately difficult. He states thatt he is enjoying the program and that he has been able to do more walking.  Expected Outcomes See initial core components See admission goals For Philip [name] to continue to be able to progress with his workloads and METS levels. Controlling his SOB and maintaining normal oxygen saturations on room air. For Philip [name] to continue to increase his METS and workloads with his participation in PR. Philip [name] does not seem interested in weight reduction tips.              Core Components/Risk Factors/Patient Goals at Discharge (Final Review):   Goals and Risk Factor Review - 10/08/21 0916       Core Components/Risk Factors/Patient Goals Review   Personal Goals Review Weight Management/Obesity;Improve shortness of breath with ADL's;Increase knowledge of respiratory medications and ability to use respiratory devices properly.;Develop more efficient breathing techniques such as purse lipped breathing and diaphragmatic breathing and practicing self-pacing with activity.    Review Philip [name]'s weight  has continued to be about the same since he started the PR program. His blood sugars have been from141 to 285. He is exercising on the nu step and the track. He has been consistent in his METS.He states that he is working somewhat hard and his dyspnea is moderately difficult. He states thatt he is enjoying the program and that he has been able to do more walking.    Expected Outcomes For Philip [name] to continue to increase his METS and workloads with his participation in PR. Jossue does not seem interested in weight reduction tips.             ITP Comments:    Comments: [name] is Medical Director for Pulmonary Rehab at [facility].

## 2021-10-15 ENCOUNTER — Encounter (HOSPITAL_COMMUNITY)
Admission: RE | Admit: 2021-10-15 | Discharge: 2021-10-15 | Disposition: A | Discharge status: Home / Self Care | Payer: 59 | Source: Ambulatory Visit | Attending: Pulmonary Disease | Admitting: Pulmonary Disease

## 2021-10-15 ENCOUNTER — Other Ambulatory Visit: Payer: Self-pay

## 2021-10-15 DIAGNOSIS — R0609 Other forms of dyspnea: Secondary | ICD-10-CM | POA: Diagnosis not present

## 2021-10-15 DIAGNOSIS — U099 Post covid-19 condition, unspecified: Secondary | ICD-10-CM | POA: Insufficient documentation

## 2021-10-15 LAB — GLUCOSE, CAPILLARY
Glucose-Capillary: 246 mg/dL — ABNORMAL HIGH (ref 70–99)
Glucose-Capillary: 209 mg/dL — ABNORMAL HIGH (ref 70–99)

## 2021-10-20 NOTE — Progress Notes (Signed)
Discharge Progress Report  Patient Details  Name: Philip [name] MRN: [id number] Date of Birth: 15-Jan-1966 Referring Provider:   [name] Walk Test from 08/03/2021 in [place]  Referring Provider Baneberry        Number of Visits: 15  Reason for Discharge:  Patient has met program and personal goals.  Smoking History:  Social History   Tobacco Use  Smoking Status Never  Smokeless Tobacco Never    Diagnosis:  Dyspnea on exertion  Post covid-19 condition, unspecified  ADL [facility]:  Pulmonary Assessment Scores     Row Name 08/03/21 1128 09/22/21 1638 10/15/21 1550     ADL [facility]   ADL Phase Entry Exit Exit   SOB Score total 85 85 --     CAT Score   CAT Score 29 29 --     mMRC Score   mMRC Score 4 -- 4            Initial Exercise Prescription:  Initial Exercise Prescription - 08/03/21 1300       Date of Initial Exercise RX and Referring Provider   Date 08/03/21    Referring Provider [name]    Expected Discharge Date 10/08/21      NuStep   Level 1    SPM 40    Minutes 20      Prescription Details   Frequency (times per week) 2    Duration Progress to 30 minutes of continuous aerobic without signs/symptoms of physical distress      Intensity   THRR 40-80% of Max Heartrate 66-133    Ratings of Perceived Exertion 11-13    Perceived Dyspnea 0-4      Progression   Progression Continue to progress workloads to maintain intensity without signs/symptoms of physical distress.      Resistance Training   Training Prescription Yes    Weight Red bands    Reps 10-15             Discharge Exercise Prescription (Final Exercise Prescription Changes):  Exercise Prescription Changes - 09/22/21 1500       Response to Exercise   Blood Pressure (Admit) 150/84    Blood Pressure (Exercise) 144/80    Blood Pressure (Exit) 158/80    Heart Rate (Admit) 65 bpm    Heart Rate (Exercise) 96 bpm    Heart Rate  (Exit) 74 bpm    Oxygen Saturation (Admit) 97 %    Oxygen Saturation (Exercise) 95 %    Oxygen Saturation (Exit) 97 %    Rating of Perceived Exertion (Exercise) 13    Perceived Dyspnea (Exercise) 3    Duration Continue with 30 min of aerobic exercise without signs/symptoms of physical distress.    Intensity THRR unchanged      Progression   Progression Continue to progress workloads to maintain intensity without signs/symptoms of physical distress.      Resistance Training   Training Prescription Yes    Weight Blue bands    Reps 10-15    Time 10 Minutes      NuStep   Level 3    SPM 70    Minutes 20    METs 1.9      Track   Laps 9    Minutes 10             Functional Capacity:  6 Minute Walk     Row Name 08/03/21 1337 10/15/21 1551  6 Minute Walk   Phase Initial Discharge    Distance 340 feet 989 feet    Distance % Change -- 190.8 %    Distance Feet Change -- 649 ft    Walk Time 6 minutes 6 minutes    # of Rest Breaks 3  0:58-1:34, 2:45-3:18, 4:14-4:37 0    MPH 0.64 1.87    METS 0.1 1.67    RPE 14 13    Perceived Dyspnea  3.5 3    VO2 Peak 0.36 5.85    Symptoms No No    Resting HR 51 bpm 80 bpm    Resting BP 145/84 130/84    Resting Oxygen Saturation  98 % 96 %    Exercise Oxygen Saturation  during 6 min walk 95 % 91 %    Max Ex. HR 67 bpm 103 bpm    Max Ex. BP 164/92 154/84    2 Minute Post BP 135/74 140/80      Interval HR   1 Minute HR 63 85    2 Minute HR 61 101    3 Minute HR 62 106    4 Minute HR 59 103    5 Minute HR 63 99    6 Minute HR 67 97    2 Minute Post HR 64 80    Interval Heart Rate? Yes Yes      Interval Oxygen   Interval Oxygen? Yes --    Baseline Oxygen Saturation % 98 % 96 %    1 Minute Oxygen Saturation % 96 % 95 %    1 Minute Liters of Oxygen 0 L 0 L    2 Minute Oxygen Saturation % 95 % 90 %    2 Minute Liters of Oxygen 0 L 0 L    3 Minute Oxygen Saturation % 96 % 91 %    3 Minute Liters of Oxygen 0 L 0 L    4  Minute Oxygen Saturation % 95 % 94 %    4 Minute Liters of Oxygen 0 L 0 L    5 Minute Oxygen Saturation % 98 % 93 %    5 Minute Liters of Oxygen 0 L 0 L    6 Minute Oxygen Saturation % 96 % 93 %    6 Minute Liters of Oxygen 0 L 0 L    2 Minute Post Oxygen Saturation % 95 % 98 %    2 Minute Post Liters of Oxygen 0 L 0 L             Psychological, QOL, Others - Outcomes: PHQ 2/9: Depression screen [facility] 2/9 10/15/2021 08/03/2021  Decreased Interest 0 0  Down, Depressed, Hopeless 0 0  PHQ - 2 Score 0 0  Altered sleeping 0 0  Tired, decreased energy 3 0  Change in appetite 0 0  Feeling bad or failure about yourself  0 0  Trouble concentrating 0 0  Moving slowly or fidgety/restless 3 0  Suicidal thoughts 0 0  PHQ-9 Score 6 0  Difficult doing work/chores Very difficult Not difficult at all    Quality of Life:   Personal Goals: Goals established at orientation with interventions provided to work toward goal.  Personal Goals and Risk Factors at Admission - 08/03/21 1133       Core Components/Risk Factors/Patient Goals on Admission    Weight Management Weight Loss    Improve shortness of breath with ADL's Yes    Intervention Provide education, individualized  exercise plan and daily activity instruction to help decrease symptoms of SOB with activities of daily living.    Expected Outcomes Short Term: Improve cardiorespiratory fitness to achieve a reduction of symptoms when performing ADLs;Long Term: Be able to perform more ADLs without symptoms or delay the onset of symptoms    Diabetes Yes    Intervention Provide education about signs/symptoms and action to take for hypo/hyperglycemia.;Provide education about proper nutrition, including hydration, and aerobic/resistive exercise prescription along with prescribed medications to achieve blood glucose in normal ranges: Fasting glucose 65-99 mg/dL    Expected Outcomes Short Term: Participant verbalizes understanding of the  signs/symptoms and immediate care of hyper/hypoglycemia, proper foot care and importance of medication, aerobic/resistive exercise and nutrition plan for blood glucose control.;Long Term: Attainment of HbA1C < 7%.              Personal Goals Discharge:  Goals and Risk Factor Review     Row Name 08/13/21 0949 09/16/21 1354 10/08/21 0916         Core Components/Risk Factors/Patient Goals Review   Personal Goals Review Weight Management/Obesity;Improve shortness of breath with ADL's;Develop more efficient breathing techniques such as purse lipped breathing and diaphragmatic breathing and practicing self-pacing with activity.;Increase knowledge of respiratory medications and ability to use respiratory devices properly. Weight Management/Obesity;Improve shortness of breath with ADL's;Develop more efficient breathing techniques such as purse lipped breathing and diaphragmatic breathing and practicing self-pacing with activity.;Increase knowledge of respiratory medications and ability to use respiratory devices properly.;Diabetes Weight Management/Obesity;Improve shortness of breath with ADL's;Increase knowledge of respiratory medications and ability to use respiratory devices properly.;Develop more efficient breathing techniques such as purse lipped breathing and diaphragmatic breathing and practicing self-pacing with activity.     Review Kahlel has not been able to attend any exercsie sessions due to having a stomach virus. Marcelo's weight has actually gone up since he has been coming to the PR program. He was just on the nu step for his whole time for exercise, but has now transitioned to doing the nu step and the track. He is now doing 15 minutes at both exercise stations on room air.Marland KitchenHis blood sugars have been from 180-295. His oxygen saturations have been from 95- 98% with reports of SOB being mild to moderately difficult. Aquila's weight has continued to be about the same since he started the PR  program. His blood sugars have been from141 to 285. He is exercising on the nu step and the track. He has been consistent in his METS.He states that he is working somewhat hard and his dyspnea is moderately difficult. He states thatt he is enjoying the program and that he has been able to do more walking.     Expected Outcomes See initial core components See admission goals For Gopal to continue to be able to progress with his workloads and METS levels. Controlling his SOB and maintaining normal oxygen saturations on room air. For Greogry to continue to increase his METS and workloads with his participation in PR. Glendel does not seem interested in weight reduction tips.              Exercise Goals and Review:  Exercise Goals     Row Name 08/03/21 1142 08/10/21 1043 09/09/21 0854 10/07/21 1115       Exercise Goals   Increase Physical Activity Yes Yes Yes Yes    Intervention Provide advice, education, support and counseling about physical activity/exercise needs.;Develop an individualized exercise prescription for aerobic and resistive training based  on initial evaluation findings, risk stratification, comorbidities and participant's personal goals. Provide advice, education, support and counseling about physical activity/exercise needs.;Develop an individualized exercise prescription for aerobic and resistive training based on initial evaluation findings, risk stratification, comorbidities and participant's personal goals. Provide advice, education, support and counseling about physical activity/exercise needs.;Develop an individualized exercise prescription for aerobic and resistive training based on initial evaluation findings, risk stratification, comorbidities and participant's personal goals. Provide advice, education, support and counseling about physical activity/exercise needs.;Develop an individualized exercise prescription for aerobic and resistive training based on initial evaluation  findings, risk stratification, comorbidities and participant's personal goals.    Expected Outcomes Short Term: Attend rehab on a regular basis to increase amount of physical activity.;Long Term: Exercising regularly at least 3-5 days a week.;Long Term: Add in home exercise to make exercise part of routine and to increase amount of physical activity. Short Term: Attend rehab on a regular basis to increase amount of physical activity.;Long Term: Exercising regularly at least 3-5 days a week.;Long Term: Add in home exercise to make exercise part of routine and to increase amount of physical activity. Short Term: Attend rehab on a regular basis to increase amount of physical activity.;Long Term: Exercising regularly at least 3-5 days a week.;Long Term: Add in home exercise to make exercise part of routine and to increase amount of physical activity. Short Term: Attend rehab on a regular basis to increase amount of physical activity.;Long Term: Exercising regularly at least 3-5 days a week.;Long Term: Add in home exercise to make exercise part of routine and to increase amount of physical activity.    Increase Strength and Stamina Yes Yes Yes Yes    Intervention Provide advice, education, support and counseling about physical activity/exercise needs.;Develop an individualized exercise prescription for aerobic and resistive training based on initial evaluation findings, risk stratification, comorbidities and participant's personal goals. Provide advice, education, support and counseling about physical activity/exercise needs.;Develop an individualized exercise prescription for aerobic and resistive training based on initial evaluation findings, risk stratification, comorbidities and participant's personal goals. Provide advice, education, support and counseling about physical activity/exercise needs.;Develop an individualized exercise prescription for aerobic and resistive training based on initial evaluation  findings, risk stratification, comorbidities and participant's personal goals. Provide advice, education, support and counseling about physical activity/exercise needs.;Develop an individualized exercise prescription for aerobic and resistive training based on initial evaluation findings, risk stratification, comorbidities and participant's personal goals.    Expected Outcomes Short Term: Increase workloads from initial exercise prescription for resistance, speed, and METs.;Short Term: Perform resistance training exercises routinely during rehab and add in resistance training at home;Long Term: Improve cardiorespiratory fitness, muscular endurance and strength as measured by increased METs and functional capacity (6MWT) Short Term: Increase workloads from initial exercise prescription for resistance, speed, and METs.;Short Term: Perform resistance training exercises routinely during rehab and add in resistance training at home;Long Term: Improve cardiorespiratory fitness, muscular endurance and strength as measured by increased METs and functional capacity (6MWT) Short Term: Increase workloads from initial exercise prescription for resistance, speed, and METs.;Short Term: Perform resistance training exercises routinely during rehab and add in resistance training at home;Long Term: Improve cardiorespiratory fitness, muscular endurance and strength as measured by increased METs and functional capacity (6MWT) Short Term: Increase workloads from initial exercise prescription for resistance, speed, and METs.;Short Term: Perform resistance training exercises routinely during rehab and add in resistance training at home;Long Term: Improve cardiorespiratory fitness, muscular endurance and strength as measured by increased METs and functional capacity (6MWT)  Able to understand and use rate of perceived exertion (RPE) scale Yes Yes Yes Yes    Intervention Provide education and explanation on how to use RPE scale Provide  education and explanation on how to use RPE scale Provide education and explanation on how to use RPE scale Provide education and explanation on how to use RPE scale    Expected Outcomes Short Term: Able to use RPE daily in rehab to express subjective intensity level;Long Term:  Able to use RPE to guide intensity level when exercising independently Short Term: Able to use RPE daily in rehab to express subjective intensity level;Long Term:  Able to use RPE to guide intensity level when exercising independently Short Term: Able to use RPE daily in rehab to express subjective intensity level;Long Term:  Able to use RPE to guide intensity level when exercising independently Short Term: Able to use RPE daily in rehab to express subjective intensity level;Long Term:  Able to use RPE to guide intensity level when exercising independently    Able to understand and use Dyspnea scale Yes Yes Yes Yes    Intervention Provide education and explanation on how to use Dyspnea scale Provide education and explanation on how to use Dyspnea scale Provide education and explanation on how to use Dyspnea scale Provide education and explanation on how to use Dyspnea scale    Expected Outcomes Short Term: Able to use Dyspnea scale daily in rehab to express subjective sense of shortness of breath during exertion;Long Term: Able to use Dyspnea scale to guide intensity level when exercising independently Short Term: Able to use Dyspnea scale daily in rehab to express subjective sense of shortness of breath during exertion;Long Term: Able to use Dyspnea scale to guide intensity level when exercising independently Short Term: Able to use Dyspnea scale daily in rehab to express subjective sense of shortness of breath during exertion;Long Term: Able to use Dyspnea scale to guide intensity level when exercising independently Short Term: Able to use Dyspnea scale daily in rehab to express subjective sense of shortness of breath during  exertion;Long Term: Able to use Dyspnea scale to guide intensity level when exercising independently    Knowledge and understanding of Target Heart Rate Range (THRR) Yes Yes Yes Yes    Intervention Provide education and explanation of THRR including how the numbers were predicted and where they are located for reference Provide education and explanation of THRR including how the numbers were predicted and where they are located for reference Provide education and explanation of THRR including how the numbers were predicted and where they are located for reference Provide education and explanation of THRR including how the numbers were predicted and where they are located for reference    Expected Outcomes Short Term: Able to state/look up THRR;Short Term: Able to use daily as guideline for intensity in rehab;Long Term: Able to use THRR to govern intensity when exercising independently Short Term: Able to state/look up THRR;Short Term: Able to use daily as guideline for intensity in rehab;Long Term: Able to use THRR to govern intensity when exercising independently Short Term: Able to state/look up THRR;Short Term: Able to use daily as guideline for intensity in rehab;Long Term: Able to use THRR to govern intensity when exercising independently Short Term: Able to state/look up THRR;Short Term: Able to use daily as guideline for intensity in rehab;Long Term: Able to use THRR to govern intensity when exercising independently    Understanding of Exercise Prescription Yes Yes Yes Yes  Intervention Provide education, explanation, and written materials on patient's individual exercise prescription Provide education, explanation, and written materials on patient's individual exercise prescription Provide education, explanation, and written materials on patient's individual exercise prescription Provide education, explanation, and written materials on patient's individual exercise prescription    Expected Outcomes  Short Term: Able to explain program exercise prescription;Long Term: Able to explain home exercise prescription to exercise independently Short Term: Able to explain program exercise prescription;Long Term: Able to explain home exercise prescription to exercise independently Short Term: Able to explain program exercise prescription;Long Term: Able to explain home exercise prescription to exercise independently Short Term: Able to explain program exercise prescription;Long Term: Able to explain home exercise prescription to exercise independently             Exercise Goals Re-Evaluation:  Exercise Goals Re-Evaluation     Row Name 08/10/21 1043 09/09/21 0854 10/07/21 1115         Exercise Goal Re-Evaluation   Exercise Goals Review Increase Physical Activity;Increase Strength and Stamina;Able to understand and use rate of perceived exertion (RPE) scale;Able to understand and use Dyspnea scale;Knowledge and understanding of Target Heart Rate Range (THRR);Understanding of Exercise Prescription Increase Physical Activity;Increase Strength and Stamina;Able to understand and use rate of perceived exertion (RPE) scale;Able to understand and use Dyspnea scale;Knowledge and understanding of Target Heart Rate Range (THRR);Understanding of Exercise Prescription Increase Physical Activity;Increase Strength and Stamina;Able to understand and use rate of perceived exertion (RPE) scale;Able to understand and use Dyspnea scale;Knowledge and understanding of Target Heart Rate Range (THRR);Understanding of Exercise Prescription     Comments Daneil is scheduled to begin exercise this weeek. Will monitor and progress as able. Savoy has completed 6 exercise sessions. His initial ExRx included the Nustep for 30 min. Thurmond has progressed to the Nustep for 20 min and track walking 10 min. He averages 1.9 METs at level 2 on the Nustep and 6 laps on the track. Wilberth is deconditioned as he has to take multiple rest breaks on the  track. I have briefly discussed home exercise with [name]. I encouraged him to walk laps in the store when shopping with his wife. I also mentioned the importance of exercise outside of rehab. He has voiced understanding. Jibri performs the warmup and cooldown standing without limitations. Will continue to monitor and progress as able. Dereon has completed 12 exercise sessions. He exercises for 15 min on the Nustep and track. He averages 1.9 METs at level 3 on the Nustep and 2.05 METs on the track. He performs the warmup and cooldown mostly standing. He will sit down when we do squats. Joshuan has increased his workload on the Nustep but averages the same laps on the track. He gets short of breath on the track and has to take multiple rest breaks. I have encouraged Odysseus to walk at home. I am not sure if or how much he is exercising at home. Will continue to monitor and progress as able.     Expected Outcomes Through exercise at rehab and home, the patient will decrease shortness of breath with daily activities and feel confident in carrying out an exercise regimn at home. Through exercise at rehab and home, the patient will decrease shortness of breath with daily activities and feel confident in carrying out an exercise regimn at home. Through exercise at rehab and home, the patient will decrease shortness of breath with daily activities and feel confident in carrying out an exercise regimn at home.  Nutrition & Weight - Outcomes:  Pre Biometrics - 08/03/21 1058       Pre Biometrics   Grip Strength 29 kg              Nutrition:   Nutrition Discharge:   Education Questionnaire Score:  Knowledge Questionnaire Score - 09/22/21 1638       Knowledge Questionnaire Score   Post Score 18/18             Goals reviewed with patient; copy given to patient.

## 2021-10-26 ENCOUNTER — Other Ambulatory Visit: Payer: Self-pay

## 2021-10-26 DIAGNOSIS — J4540 Moderate persistent asthma, uncomplicated: Secondary | ICD-10-CM

## 2021-10-26 MED ORDER — TRELEGY ELLIPTA 100-62.5-25 MCG/ACT IN AEPB: 1.0000 | INHALATION_SPRAY | Freq: Every day | RESPIRATORY_TRACT | 3 refills | Status: DC

## 2021-10-29 ENCOUNTER — Encounter: Payer: Self-pay | Admitting: Pulmonary Disease

## 2021-10-29 ENCOUNTER — Other Ambulatory Visit: Payer: Self-pay

## 2021-10-29 ENCOUNTER — Ambulatory Visit (INDEPENDENT_AMBULATORY_CARE_PROVIDER_SITE_OTHER): Payer: 59 | Admitting: Pulmonary Disease

## 2021-10-29 VITALS — BP 128/82 | HR 74 | Ht 73.0 in | Wt >= 6400 oz

## 2021-10-29 DIAGNOSIS — J4540 Moderate persistent asthma, uncomplicated: Secondary | ICD-10-CM | POA: Diagnosis not present

## 2021-10-29 DIAGNOSIS — G4733 Obstructive sleep apnea (adult) (pediatric): Secondary | ICD-10-CM | POA: Diagnosis not present

## 2021-10-29 NOTE — Patient Instructions (Addendum)
Use trelegy 1 puff daily - rinse mouth out after each use  Use albuterol inhaler as needed  Continue CPAP nightly  Continue your physical activity with going for daily walks and I think joining a gym like [employer] would be great. Continue the work outs as you did in pulmonary rehab.   Follow up in 6 months.
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
## 2021-11-03 ENCOUNTER — Telehealth: Payer: Self-pay | Admitting: Pulmonary Disease

## 2021-11-04 NOTE — Telephone Encounter (Signed)
Patient checking on glucose meter. Patient phone number is [phone].

## 2021-11-05 NOTE — Telephone Encounter (Signed)
Pt needs to contact PCP about getting Rx for glucose meter. Called and spoke with pt letting him know this info and he verbalized understanding. Nothing further needed.

## 2021-11-09 ENCOUNTER — Telehealth: Payer: Self-pay | Admitting: Pulmonary Disease

## 2021-11-09 DIAGNOSIS — Z0289 Encounter for other administrative examinations: Secondary | ICD-10-CM

## 2021-11-09 NOTE — Telephone Encounter (Signed)
Patient called again this afternoon and spoke with Selena Batten. Patient requested that we contact [employer] to let them know we are still working on the paperwork so they do not deny his claim. Spoke with Destiny at [employer] to inform them we are still working on disability forms.

## 2021-11-10 LAB — LIPID PANEL
Cholesterol: 238 — AB (ref 0–200)
HDL: 38 (ref 35–70)
LDL Cholesterol: 159
Triglycerides: 242 — AB (ref 40–160)

## 2021-11-10 LAB — BASIC METABOLIC PANEL
BUN: 18 (ref 4–21)
CO2: 27 — AB (ref 13–22)
Chloride: 101 (ref 99–108)
Creatinine: 1.1 (ref 0.6–1.3)
Glucose: 126
Potassium: 3.9 mEq/L (ref 3.5–5.1)
Sodium: 138 (ref 137–147)

## 2021-11-10 LAB — HEMOGLOBIN A1C: Hemoglobin A1C: 10.3

## 2021-11-10 LAB — COMPREHENSIVE METABOLIC PANEL
Albumin: 3.5 (ref 3.5–5.0)
eGFR: 77

## 2021-11-10 LAB — HEPATIC FUNCTION PANEL
ALT: 16 U/L (ref 10–40)
AST: 19 (ref 14–40)
Alkaline Phosphatase: 59 (ref 25–125)
Bilirubin, Total: 0.9

## 2021-11-10 LAB — CBC AND DIFFERENTIAL
HCT: 41 (ref 41–53)
Hemoglobin: 12.8 — AB (ref 13.5–17.5)
Neutrophils Absolute: 4628
Platelets: 311 10*3/uL (ref 150–400)
WBC: 7.6

## 2021-11-10 LAB — PSA: PSA: 0.63

## 2021-11-10 LAB — TSH: TSH: 2.37 (ref 0.41–5.90)

## 2021-11-10 LAB — CBC: RBC: 4.78 (ref 3.87–5.11)

## 2021-11-11 NOTE — Telephone Encounter (Signed)
Please advise on explaining to patient why he is needing to go back to work. He feels that he is not ready to go back.  ?

## 2021-11-13 NOTE — Telephone Encounter (Signed)
Patient came into the office today requesting to speak with Philip [name] in regards to his disability. I spoke with the patient and explained to him that per Philip [name], he should be able to return back to work on limited duty and reduced hours.  ? ?Patient stated that he still does not feel like he is ready to go back to work. He is still having SOB episodes and occasional chest pain.  He is not sure if his job has any limited duties and he has not asked his supervisor about this.  ? ?He is aware that Philip [name] will NOT sign the disability papers for him to remain out of work for another 6 months. Philip [name] is aware of this as well as evident by the voicemail he played in the exam room for me to listen.  ? ?I explained to him that I would reach out to his PCP to see if he would be willing to sign the disability paperwork. Also explained that he needed to have some responsibility in talking to Philip [name] himself. Philip [name]' office is closed on Fridays so I will have to call their office on Monday morning. He still wanted me to call today and see if I could leave a message for the on-call service. I advised him that the on-call service is for emergencies only and this could wait until Monday morning.  ? ?Will route this encounter to myself to follow up on the request of his PCP completing the disability forms. I have received the forms from Philip [name].  ?

## 2021-11-13 NOTE — Telephone Encounter (Signed)
Kathlee Nations from [facility] called and is needing an estimated return to work date with restrictions, the actually restrictions, and the estimated return to work date with no restrictions (if unknown- it can say determined at next office visit). ? ?I did mention to Kathlee Nations that there were restrictions on the form sent in and Dr. Erin Fulling was clearing the patient to return to work with restrictions which would mean next business day. Kathlee Nations states she needs actual dates and restrictions on a letter head faxed in.  ? ?  ?Fax number is [phone] Attention: Benjamine Mola.  ? ?Call back number for Kathlee Nations [phone].  ?  ? ?

## 2021-11-16 NOTE — Telephone Encounter (Signed)
I called Dr. Su Hilt' office to either speak with his nurse or leave a message in regards to the disability forms. I was on hold for over 10 mins without speaking with anyone. I will attempt to call them back tomorrow morning.  ? ?Dr. Francine Graven, are you ok with [place] typing the letter that Philip [name] mentioned on 11/13/21 for him to return back to work?  ?

## 2021-11-17 ENCOUNTER — Telehealth: Payer: Self-pay | Admitting: Pulmonary Disease

## 2021-11-18 NOTE — Telephone Encounter (Signed)
Dr. Mancel Bale' nurse called me back. I explained to her the situation. She has asked that I fax a copy of his disability forms to [phone] for Dr. Mancel Bale to review. She is aware that if Dr. Mancel Bale needs a copy of the pulm rehab notes as well as any office notes she can call back.  ? ?Forms have been faxed.  ? ?Called and spoke with patient. He is aware of this.  ? ?Nothing further needed at time of call.  ?

## 2021-11-18 NOTE — Telephone Encounter (Signed)
Called Dr. Su Hilt office and spoke with the receptionist. I provided her with my name and call back number since the nurse was not available. Once I speak with the nurse, I will fax the paperwork to their office.  ?

## 2021-11-19 ENCOUNTER — Ambulatory Visit (INDEPENDENT_AMBULATORY_CARE_PROVIDER_SITE_OTHER): Payer: 59 | Admitting: Bariatrics

## 2021-11-19 ENCOUNTER — Other Ambulatory Visit: Payer: Self-pay

## 2021-11-19 ENCOUNTER — Encounter (INDEPENDENT_AMBULATORY_CARE_PROVIDER_SITE_OTHER): Payer: Self-pay | Admitting: Bariatrics

## 2021-11-19 VITALS — BP 150/89 | HR 71 | Temp 98.0°F | Ht 71.0 in | Wt >= 6400 oz

## 2021-11-19 DIAGNOSIS — E1169 Type 2 diabetes mellitus with other specified complication: Secondary | ICD-10-CM

## 2021-11-19 DIAGNOSIS — E785 Hyperlipidemia, unspecified: Secondary | ICD-10-CM

## 2021-11-19 DIAGNOSIS — R0602 Shortness of breath: Secondary | ICD-10-CM

## 2021-11-19 DIAGNOSIS — Z1331 Encounter for screening for depression: Secondary | ICD-10-CM | POA: Diagnosis not present

## 2021-11-19 DIAGNOSIS — R5383 Other fatigue: Secondary | ICD-10-CM | POA: Diagnosis not present

## 2021-11-19 DIAGNOSIS — E669 Obesity, unspecified: Secondary | ICD-10-CM

## 2021-11-19 DIAGNOSIS — Z6841 Body Mass Index (BMI) 40.0 and over, adult: Secondary | ICD-10-CM

## 2021-11-19 DIAGNOSIS — Z7985 Long-term (current) use of injectable non-insulin antidiabetic drugs: Secondary | ICD-10-CM

## 2021-11-19 DIAGNOSIS — J984 Other disorders of lung: Secondary | ICD-10-CM

## 2021-11-19 DIAGNOSIS — G4733 Obstructive sleep apnea (adult) (pediatric): Secondary | ICD-10-CM | POA: Diagnosis not present

## 2021-11-23 ENCOUNTER — Encounter (INDEPENDENT_AMBULATORY_CARE_PROVIDER_SITE_OTHER): Payer: Self-pay | Admitting: Bariatrics

## 2021-11-23 NOTE — Progress Notes (Signed)
Chief Complaint:   OBESITY Philip [name] (MR# [id number]) is a 56 y.o. male who presents for evaluation and treatment of obesity and related comorbidities. Current BMI is Body mass index is 57.04 kg/m. Philip [name] has been struggling with his weight for many years and has been unsuccessful in either losing weight, maintaining weight loss, or reaching his healthy weight goal.  Philip [name] does like to cook but states lack of ideas. He states that he craves starches.   Philip [name] is currently in the action stage of change and ready to dedicate time achieving and maintaining a healthier weight. Philip [name] is interested in becoming our patient and working on intensive lifestyle modifications including (but not limited to) diet and exercise for weight loss.  Philip [name]'s habits were reviewed today and are as follows: His family eats meals together, he thinks his family will eat healthier with him, his desired weight loss is 109 pounds, he has been heavy most of his life, he started gaining weight at age 62, his heaviest weight ever was 426 pounds, he has significant food cravings issues, he snacks frequently in the evenings, he skips meals frequently, he is frequently drinking liquids with calories, he frequently eats larger portions than normal, and he struggles with emotional eating.  Depression Screen Philip [name]'s Food and Mood (modified PHQ-9) score was 4.  Depression screen [name] 2/9 11/19/2021  Decreased Interest 2  Down, Depressed, Hopeless 0  PHQ - 2 Score 2  Altered sleeping 0  Tired, decreased energy 1  Change in appetite 0  Feeling bad or failure about yourself  0  Trouble concentrating 0  Moving slowly or fidgety/restless 1  Suicidal thoughts 0  PHQ-9 Score 4  Difficult doing work/chores Somewhat difficult   Subjective:   1. Other fatigue Philip [name] will continue activities. Philip [name] denies daytime somnolence and denies waking up still tired. Patient has a history of symptoms of daytime fatigue. Philip [name] generally  gets 7 or 8 hours of sleep per night, and states that he has difficulty falling asleep and generally restful sleep. Snoring is not present. Apneic episodes are not present. Epworth Sleepiness Score is 6.    2. SOB (shortness of breath) on exertion Philip [name] will continue activities. Philip [name] notes increasing shortness of breath with exercising and seems to be worsening over time with weight gain. He notes getting out of breath sooner with activity than he used to. This has not gotten worse recently. Philip [name] denies shortness of breath at rest or orthopnea.   3. OSA (obstructive sleep apnea) Philip [name] uses a CPAP at night.   4. Diabetes mellitus type 2 in obese [facility]) Philip [name] is currently taking Ozempic, pioglitazone and [place]. He started Ozempic approximately 1 month ago. His last A1C was 10.3 per Philip [name].   5. Restrictive lung disease Philip [name] sees a pulmonologist.   6. Hyperlipidemia associated with type 2 diabetes mellitus ([name]) Philip [name] is currently taking atorvastatin. His HDL has decreased at 38. His chol/HDL ration equals 6.3.  Assessment/Plan:   1. Other fatigue Philip [name] will gradually increase activities. Philip [name] does feel that his weight is causing his energy to be lower than it should be. Fatigue may be related to obesity, depression or many other causes. Labs will be ordered, and in the meanwhile, Philip [name] will focus on self care including making healthy food choices, increasing physical activity and focusing on stress reduction.   2. SOB (shortness of breath) on exertion Philip [name] will gradually increase activities. Philip [name] does feel that he gets out of breath more  easily that he used to when he exercises. Philip [name]'s shortness of breath appears to be obesity related and exercise induced. He has agreed to work on weight loss and gradually increase exercise to treat his exercise induced shortness of breath. Will continue to monitor closely.   3. OSA (obstructive sleep apnea) Philip [name] will continue using his CPAP at night.  Intensive lifestyle modifications are the first line treatment for this issue. We discussed several lifestyle modifications today and he will continue to work on diet, exercise and weight loss efforts. We will continue to monitor. Orders and follow up as documented in patient record.    4. Diabetes mellitus type 2 in obese Philip [name]) Philip [name] will continue taking Ozempic, pioglitazone and [place]. Good blood sugar control is important to decrease the likelihood of diabetic complications such as nephropathy, neuropathy, limb loss, blindness, coronary artery disease, and death. Intensive lifestyle modification including diet, exercise and weight loss are the first line of treatment for diabetes.   5. Restrictive lung disease Philip [name] will continue seeing his pulmonologist.   6. Hyperlipidemia associated with type 2 diabetes mellitus ([name]) Philip [name] and specific lipid/LDL goals reviewed.  Philip [name] will continue taking atorvastatin. We discussed several lifestyle modifications today and Philip [name] will continue to work on diet, exercise and weight loss efforts. Orders and follow up as documented in patient record.   Counseling Intensive lifestyle modifications are the first line treatment for this issue. Dietary changes: Increase soluble fiber. Decrease simple carbohydrates. Exercise changes: Moderate to vigorous-intensity aerobic activity 150 minutes per week if tolerated. Lipid-lowering medications: see documented in medical record.  7. Depression screen Philip [name] had a negative depression screening. Depression is commonly associated with obesity and often results in emotional eating behaviors. We will monitor this closely and work on CBT to help improve the non-hunger eating patterns. Referral to Psychology may be required if no improvement is seen as he continues in our clinic.   8. Class 3 severe obesity with serious comorbidity and body mass index (BMI) of 50.0 to 59.9 in adult, unspecified obesity type  Philip [name]) Philip [name] is currently in the action stage of change and his goal is to continue with weight loss efforts. I recommend Rafat begin the structured treatment plan as follows:  He has agreed to the Category 4 Plan.  Camen will continue meal planning and he will continue intentional eating. We reviewed labs from 05/26/2021 CBC, 12/29/2020 glucose and BMP and 11/11/2021 Lipid panel. He will have no sugary drinks.   Exercise goals: No exercise has been prescribed at this time.   Behavioral modification strategies: increasing lean protein intake, decreasing simple carbohydrates, increasing vegetables, increasing water intake, decreasing eating out, no skipping meals, meal planning and cooking strategies, keeping healthy foods in the home, and planning for success.  He was informed of the importance of frequent follow-up visits to maximize his success with intensive lifestyle modifications for his multiple health conditions. He was informed we would discuss his lab results at his next visit unless there is a critical issue that needs to be addressed sooner. Chasity agreed to keep his next visit at the agreed upon time to discuss these results.  Objective:   Blood pressure (!) 150/89, pulse 71, temperature 98 F (36.7 C), height 5\' 11"  (1.803 m), weight (!) 409 lb (185.5 kg), SpO2 99 %. Body mass index is 57.04 kg/m.  EKG: Normal sinus rhythm, rate unable to obtain.  Indirect Calorimeter completed today shows a VO2 of 448 and a REE of 3096.  His  calculated basal metabolic rate is 7846 thus his basal metabolic rate is worse than expected.  General: Cooperative, alert, well developed, in no acute distress. HEENT: Conjunctivae and lids unremarkable. Philip: Regular rhythm.  Lungs: Normal work of breathing. Neurologic: No focal deficits.   Lab Results  Component Value Date   CREATININE 1.00 12/29/2020   BUN 28 (H) 12/29/2020   NA 136 12/29/2020   K 4.2 12/29/2020   CL 99 12/29/2020   CO2  30 12/29/2020   Lab Results  Component Value Date   ALT 11 12/03/2020   AST 10 12/03/2020   ALKPHOS 60 12/03/2020   BILITOT 0.9 12/03/2020   No results found for: HGBA1C No results found for: INSULIN No results found for: TSH No results found for: CHOL, HDL, LDLCALC, LDLDIRECT, TRIG, CHOLHDL Lab Results  Component Value Date   WBC 8.3 05/26/2021   HGB 12.1 (L) 05/26/2021   HCT 38.1 (L) 05/26/2021   MCV 86.3 05/26/2021   PLT 299.0 05/26/2021   No results found for: IRON, TIBC, FERRITIN  Attestation Statements:   Reviewed by clinician on day of visit: allergies, medications, problem list, medical history, surgical history, family history, social history, and previous encounter notes.  I, Jackson Latino, RMA, am acting as Energy manager for [employer], DO.  I have reviewed the above documentation for accuracy and completeness, and I agree with the above. Corinna Capra, DO

## 2021-11-27 ENCOUNTER — Telehealth: Payer: Self-pay | Admitting: Pulmonary Disease

## 2021-11-27 DIAGNOSIS — J4540 Moderate persistent asthma, uncomplicated: Secondary | ICD-10-CM

## 2021-11-27 MED ORDER — TRELEGY ELLIPTA 100-62.5-25 MCG/ACT IN AEPB: 1.0000 | INHALATION_SPRAY | Freq: Every day | RESPIRATORY_TRACT | 3 refills | Status: DC

## 2021-11-27 NOTE — Telephone Encounter (Signed)
Refill sent. Notified patient. Nothing further needed  ?

## 2021-12-03 ENCOUNTER — Encounter (INDEPENDENT_AMBULATORY_CARE_PROVIDER_SITE_OTHER): Payer: Self-pay | Admitting: Bariatrics

## 2021-12-03 ENCOUNTER — Other Ambulatory Visit: Payer: Self-pay

## 2021-12-03 ENCOUNTER — Ambulatory Visit (INDEPENDENT_AMBULATORY_CARE_PROVIDER_SITE_OTHER): Payer: 59 | Admitting: Bariatrics

## 2021-12-03 VITALS — BP 156/94 | HR 85 | Temp 97.8°F | Ht 71.0 in | Wt >= 6400 oz

## 2021-12-03 DIAGNOSIS — E785 Hyperlipidemia, unspecified: Secondary | ICD-10-CM | POA: Diagnosis not present

## 2021-12-03 DIAGNOSIS — Z7985 Long-term (current) use of injectable non-insulin antidiabetic drugs: Secondary | ICD-10-CM

## 2021-12-03 DIAGNOSIS — E1169 Type 2 diabetes mellitus with other specified complication: Secondary | ICD-10-CM | POA: Diagnosis not present

## 2021-12-03 DIAGNOSIS — Z6841 Body Mass Index (BMI) 40.0 and over, adult: Secondary | ICD-10-CM

## 2021-12-03 DIAGNOSIS — I10 Essential (primary) hypertension: Secondary | ICD-10-CM | POA: Diagnosis not present

## 2021-12-03 DIAGNOSIS — E669 Obesity, unspecified: Secondary | ICD-10-CM

## 2021-12-03 DIAGNOSIS — D649 Anemia, unspecified: Secondary | ICD-10-CM

## 2021-12-07 ENCOUNTER — Encounter (INDEPENDENT_AMBULATORY_CARE_PROVIDER_SITE_OTHER): Payer: Self-pay | Admitting: Bariatrics

## 2021-12-07 MED ORDER — OZEMPIC (0.25 OR 0.5 MG/DOSE) 2 MG/1.5ML ~~LOC~~ SOPN: 0.5000 mg | PEN_INJECTOR | SUBCUTANEOUS | 0 refills | Status: DC

## 2021-12-07 NOTE — Progress Notes (Signed)
? ? ? ?Chief Complaint:  ? ?OBESITY ?Philip [name] is here to discuss his progress with his obesity treatment plan along with follow-up of his obesity related diagnoses. [name] is on the Category 4 Plan and states he is following his eating plan approximately 98% of the time. [name] states he is doing housework and yard work for 30 minutes 7 times per week. ? ?Today's visit was #: 2 ?Starting weight: 409 lbs ?Starting date: 11/19/2021 ?Today's weight: 402 lbs ?Today's date: 12/03/2021 ?Total lbs lost to date: 7 lbs ?Total lbs lost since last in-office visit: 7 lbs ? ?Interim History: Philip [name] is down 7 lbs since his first visit. He states the plan was not that hard to follow.  ? ?Subjective:  ? ?1. Essential hypertension ?Philip [name]'s blood pressure was elevated today 156/94. He did not take his medication.  ? ?2. Diabetes mellitus type 2 in obese Philip [name]) ?Philip [name] notes decreased hunger. He is taking Ozempic, Farxiga, and Actos. His last A1C was 10.3. ? ?3. Hyperlipidemia associated with type 2 diabetes mellitus ([place]) ?Philip [name] is currently taking Lipitor.  ? ?4. Anemia, unspecified type ?Philip [name] notes decreased hemoglobin. His hematocrit was okay. Indicates low, except RDW was high.  ? ?Assessment/Plan:  ? ?1. Essential hypertension ?[name] will continue medications and will not miss doses. is working on healthy weight loss and exercise to improve blood pressure control. We will watch for signs of hypotension as he continues his lifestyle modifications. ? ?2. Diabetes mellitus type 2 in obese [facility]) ?[name]'s primary care physician added the Ozempic and stopped his sulfonylureas. [name] agrees to increase dose Ozempic from 0.25 mg to 0.5 mg. Good blood sugar control is important to decrease the likelihood of diabetic complications such as nephropathy, neuropathy, limb loss, blindness, coronary artery disease, and death. Intensive lifestyle modification including diet, exercise and weight loss are the first line of treatment for diabetes.  ? ?3.  Hyperlipidemia associated with type 2 diabetes mellitus ([place]) ?Cardiovascular risk and specific lipid/LDL goals reviewed.  Philip [name] will continue taking Lipitor. We discussed several lifestyle modifications today and Philip [name] will continue to work on diet, exercise and weight loss efforts. Orders and follow up as documented in patient record.  ? ?Counseling ?Intensive lifestyle modifications are the first line treatment for this issue. ?Dietary changes: Increase soluble fiber. Decrease simple carbohydrates. ?Exercise changes: Moderate to vigorous-intensity aerobic activity 150 minutes per week if tolerated. ?Lipid-lowering medications: see documented in medical record. ? ?4. Anemia, unspecified type ?Orders and follow up as documented in patient record.Philip [name] will follow up and recheck in 10 weeks and do anemia panel if abnormal will send to hematologist.  ? ?Counseling ?Iron is essential for our bodies to make red blood cells.  Reasons that someone may be deficient include: an iron-deficient diet (more likely in those following vegan or vegetarian diets), women with heavy menses, patients with GI disorders or poor absorption, patients that have had bariatric surgery, frequent blood donors, patients with cancer, and patients with heart disease.   ?An iron supplement has been recommended. This is found over-the-counter.  ?Iron-rich foods include dark leafy greens, red and white meats, eggs, seafood, and beans.   ?Certain foods and drinks prevent your body from absorbing iron properly. Avoid eating these foods in the same meal as iron-rich foods or with iron supplements. These foods include: coffee, black tea, and red wine; milk, dairy products, and foods that are high in calcium; beans and soybeans; whole grains.  ?Constipation can be a side effect of iron supplementation.  Increased water and fiber intake are helpful. Water goal: > 2 liters/day. Fiber goal: > 25 grams/day.  ? ?5. Obesity, current BMI 56.1 ?[name] is  currently in the action stage of change. As such, his goal is to continue with weight loss efforts. He has agreed to the Category 4 Plan.  ? ?[name] will continue meal planning and intentional eating. We reviewed labs from 11/19/2021 cholesterol panel, CMP, TSH, A1C and EKG.  ? ?Exercise goals:  As is. ? ?Behavioral modification strategies: increasing lean protein intake, decreasing simple carbohydrates, increasing vegetables, increasing water intake, decreasing eating out, no skipping meals, meal planning and cooking strategies, keeping healthy foods in the home, and planning for success. ? ?Philip [name] has agreed to follow-up with our clinic in 2 weeks with [name], FNP or [name], FNP and 4 weeks with myself. He was informed of the importance of frequent follow-up visits to maximize his success with intensive lifestyle modifications for his multiple health conditions.  ? ?Objective:  ? ?Blood pressure (!) 156/94, pulse 85, temperature 97.8 ?F (36.6 ?C), height 5\' 11"  (1.803 m), weight (!) 402 lb (182.3 kg), SpO2 98 %. ?Body mass index is 56.07 kg/m?. ? ?General: Cooperative, alert, well developed, in no acute distress. ?HEENT: Conjunctivae and lids unremarkable. ?Cardiovascular: Regular rhythm.  ?Lungs: Normal work of breathing. ?Neurologic: No focal deficits.  ? ?Lab Results  ?Component Value Date  ? CREATININE 1.00 12/29/2020  ? BUN 28 (H) 12/29/2020  ? NA 136 12/29/2020  ? K 4.2 12/29/2020  ? CL 99 12/29/2020  ? CO2 30 12/29/2020  ? ?Lab Results  ?Component Value Date  ? ALT 11 12/03/2020  ? AST 10 12/03/2020  ? ALKPHOS 60 12/03/2020  ? BILITOT 0.9 12/03/2020  ? ?No results found for: HGBA1C ?No results found for: INSULIN ?No results found for: TSH ?No results found for: CHOL, HDL, LDLCALC, LDLDIRECT, TRIG, CHOLHDL ?No results found for: VD25OH ?Lab Results  ?Component Value Date  ? WBC 8.3 05/26/2021  ? HGB 12.1 (L) 05/26/2021  ? HCT 38.1 (L) 05/26/2021  ? MCV 86.3 05/26/2021  ? PLT 299.0 05/26/2021   ? ?No results found for: IRON, TIBC, FERRITIN ? ?Attestation Statements:  ? ?Reviewed by clinician on day of visit: allergies, medications, problem list, medical history, surgical history, family history, social history, and previous encounter notes. ? ?I, Lizbeth Bark, RMA, am acting as transcriptionist for [employer], DO. ? ?I have reviewed the above documentation for accuracy and completeness, and I agree with the above. Jearld Lesch, DO ? ?

## 2021-12-10 ENCOUNTER — Other Ambulatory Visit (INDEPENDENT_AMBULATORY_CARE_PROVIDER_SITE_OTHER): Payer: Self-pay

## 2021-12-17 ENCOUNTER — Telehealth (INDEPENDENT_AMBULATORY_CARE_PROVIDER_SITE_OTHER): Payer: 59 | Admitting: Family Medicine

## 2021-12-21 NOTE — Progress Notes (Signed)
?TeleHealth Visit:  ?Due to the COVID-19 pandemic, this visit was completed with telemedicine (audio/video) technology to reduce patient and provider exposure as well as to preserve personal protective equipment.  ? ?Philip [name] has verbally consented to this TeleHealth visit. The patient is located at home, the provider is located at home. The participants in this visit include the listed provider and patient. The visit was conducted today via MyChart video. ? ?OBESITY ?Philip [name] is here to discuss his progress with his obesity treatment plan along with follow-up of his obesity related diagnoses.  ? ?Today's visit was # 3 ?Starting weight: 409 lbs ?Starting date: 11/19/21 ?Total weight loss: 7 lbs at last in office visit on 12/03/21. ?Weight at last in office visit: 402 lbs on 12/03/21 ?Today's reported weight:  No weight reported. ? ? ?Nutrition Plan: the Category 4 Plan  ?Hunger is well controlled. Cravings are well controlled.  ?Current exercise: none. Has SOB since COVID in Jan 2021. ? ?Interim History: Philip [name] has been out of town for the past 2 to 3 days on a trip to [facility]  He was off plan during this trip.  He feels he is adhering to the plan fairly well.  He does often skip breakfast.  He reports he has no appetite in the morning.  He generally avoids sweets but has had a few pieces of cake in the past few days. ?Denies intake of SSBs except for OJ every Sunday morning.  ?He reports that Philip [name] is helping to reduce his appetite. ?Assessment/Plan:  ?1. Type II Diabetes with hyperglycemia ?HgbA1c is not at goal.Last A1c 10.3.  ?Denies excessive intake of simple carbs but has had 2 pieces of cake in last few days. He says this is not usual for him.  ?CBGs: ranging 179-210.  He says these are lower than they used to be. ?Any episodes of hypoglycemia? no ?Medication(s): Philip [name] 10 mg, Philip [name] 45 mg, Philip [name] 1 mg. ?Tolerating Philip [name] well.  Denies side effects. ? ?Lab Results  ?Component Value Date  ? HGBA1C  10.3 11/10/2021  ? HGBA1C 10.6 09/22/2021  ? ?Lab Results  ?Component Value Date  ? LDLCALC 159 11/10/2021  ? CREATININE 1.1 11/10/2021  ? ? ?Plan: ?Continue fark [employer] 10 mg daily, Philip [name] 45 mg daily, and Philip [name] 1 mg weekly. ?Work on reducing intake of simple carbs. ? ?2. Hypertension associated with DM type 2 ?Hypertension needs improvement.  ?Medication(s): Philip [name] 50 mg daily, Philip [name] 50/12.5 mg daily ?Endorses SOB. ? ?BP Readings from Last 3 Encounters:  ?12/03/21 (!) 156/94  ?11/19/21 (!) 150/89  ?10/29/21 128/82  ? ?Lab Results  ?Component Value Date  ? CREATININE 1.1 11/10/2021  ? CREATININE 1.00 12/29/2020  ? CREATININE 1.00 12/03/2020  ? ? ?Plan: ?Continue current treatment plan. Continue to monitor. ? ?3. Obesity: Current BMI 56.09 ?Abdiaziz is currently in the action stage of change. As such, his goal is to continue with weight loss efforts.  ?He has agreed to the Category 4 Plan.  ? ?Exercise goals: No exercise has been prescribed at this time. ? ?Behavioral modification strategies: increasing lean protein intake and decreasing simple carbohydrates. ? ?Bronsen has agreed to follow-up with our clinic in 2 weeks.  ? ?No orders of the defined types were placed in this encounter. ? ? ?There are no discontinued medications.  ? ?No orders of the defined types were placed in this encounter. ?   ? ?Objective:  ? ?VITALS: Per patient if applicable, see vitals. ?GENERAL: Alert and in no acute distress. ?  CARDIOPULMONARY: No increased WOB. Speaking in clear sentences.  ?PSYCH: Pleasant and cooperative. Speech normal rate and rhythm. Affect is appropriate. Insight and judgement are appropriate. Attention is focused, linear, and appropriate.  ?NEURO: Oriented as arrived to appointment on time with no prompting.  ? ?Lab Results  ?Component Value Date  ? CREATININE 1.1 11/10/2021  ? BUN 18 11/10/2021  ? NA 138 11/10/2021  ? K 3.9 11/10/2021  ? CL 101 11/10/2021  ? CO2 27 (A) 11/10/2021  ? ?Lab Results  ?Component  Value Date  ? ALT 16 11/10/2021  ? AST 19 11/10/2021  ? ALKPHOS 59 11/10/2021  ? BILITOT 0.9 12/03/2020  ? ?Lab Results  ?Component Value Date  ? HGBA1C 10.3 11/10/2021  ? HGBA1C 10.6 09/22/2021  ? ?No results found for: INSULIN ?Lab Results  ?Component Value Date  ? TSH 2.37 11/10/2021  ? ?Lab Results  ?Component Value Date  ? CHOL 238 (A) 11/10/2021  ? HDL 38 11/10/2021  ? LDLCALC 159 11/10/2021  ? TRIG 242 (A) 11/10/2021  ? ?Lab Results  ?Component Value Date  ? WBC 7.6 11/10/2021  ? HGB 12.8 (A) 11/10/2021  ? HCT 41 11/10/2021  ? MCV 86.3 05/26/2021  ? PLT 311 11/10/2021  ? ?No results found for: IRON, TIBC, FERRITIN ?No results found for: VD25OH ? ?Attestation Statements:  ? ?Reviewed by clinician on day of visit: allergies, medications, problem list, medical history, surgical history, family history, social history, and previous encounter notes. ? ?Time spent on visit including pre-visit chart review and post-visit charting and care was 32 minutes.  ? ? ?

## 2021-12-22 ENCOUNTER — Telehealth (INDEPENDENT_AMBULATORY_CARE_PROVIDER_SITE_OTHER): Payer: 59 | Admitting: Family Medicine

## 2021-12-22 ENCOUNTER — Encounter (INDEPENDENT_AMBULATORY_CARE_PROVIDER_SITE_OTHER): Payer: Self-pay | Admitting: Family Medicine

## 2021-12-22 DIAGNOSIS — E669 Obesity, unspecified: Secondary | ICD-10-CM

## 2021-12-22 DIAGNOSIS — Z6841 Body Mass Index (BMI) 40.0 and over, adult: Secondary | ICD-10-CM

## 2021-12-22 DIAGNOSIS — I152 Hypertension secondary to endocrine disorders: Secondary | ICD-10-CM | POA: Diagnosis not present

## 2021-12-22 DIAGNOSIS — E1159 Type 2 diabetes mellitus with other circulatory complications: Secondary | ICD-10-CM | POA: Diagnosis not present

## 2021-12-22 DIAGNOSIS — E1165 Type 2 diabetes mellitus with hyperglycemia: Secondary | ICD-10-CM | POA: Diagnosis not present

## 2021-12-22 DIAGNOSIS — Z7985 Long-term (current) use of injectable non-insulin antidiabetic drugs: Secondary | ICD-10-CM

## 2021-12-28 ENCOUNTER — Encounter (INDEPENDENT_AMBULATORY_CARE_PROVIDER_SITE_OTHER): Payer: Self-pay | Admitting: Bariatrics

## 2021-12-28 DIAGNOSIS — E1169 Type 2 diabetes mellitus with other specified complication: Secondary | ICD-10-CM | POA: Insufficient documentation

## 2022-01-04 ENCOUNTER — Encounter (INDEPENDENT_AMBULATORY_CARE_PROVIDER_SITE_OTHER): Payer: Self-pay | Admitting: Bariatrics

## 2022-01-04 ENCOUNTER — Ambulatory Visit (INDEPENDENT_AMBULATORY_CARE_PROVIDER_SITE_OTHER): Payer: 59 | Admitting: Bariatrics

## 2022-01-04 VITALS — BP 155/77 | HR 79 | Temp 97.7°F | Ht 71.0 in | Wt 399.0 lb

## 2022-01-04 DIAGNOSIS — I152 Hypertension secondary to endocrine disorders: Secondary | ICD-10-CM | POA: Diagnosis not present

## 2022-01-04 DIAGNOSIS — E1159 Type 2 diabetes mellitus with other circulatory complications: Secondary | ICD-10-CM

## 2022-01-04 DIAGNOSIS — Z6841 Body Mass Index (BMI) 40.0 and over, adult: Secondary | ICD-10-CM

## 2022-01-04 DIAGNOSIS — E669 Obesity, unspecified: Secondary | ICD-10-CM

## 2022-01-04 DIAGNOSIS — E1165 Type 2 diabetes mellitus with hyperglycemia: Secondary | ICD-10-CM

## 2022-01-04 DIAGNOSIS — Z7985 Long-term (current) use of injectable non-insulin antidiabetic drugs: Secondary | ICD-10-CM

## 2022-01-11 NOTE — Progress Notes (Signed)
? ? ? ?Chief Complaint:  ? ?OBESITY ?Philip [name] is here to discuss his progress with his obesity treatment plan along with follow-up of his obesity related diagnoses. Philip [name] is on the Category 4 Plan and states he is following his eating plan approximately 50% of the time. 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 ? ?Today's visit was #: 4 ?Starting weight: 409 lbs ?Starting date: 11/19/2021 ?Today's weight: 399 lbs ?Today's date: 01/04/2022 ?Total lbs lost to date: 10 lbs ?Total lbs lost since last in-office visit: 3 lbs ? ?Interim History: Philip [name] is down another 3 lbs ? ?Subjective:  ? ?1. Type 2 diabetes mellitus with hyperglycemia, without long-term current use of insulin ([name]) ?Philip [name] is taking Ozempic and reports no side effects. ? ?2. Hypertension associated with type 2 diabetes mellitus ([name]) ?Philip [name] reports rushing to appointment today and his blood pressure was elevated (155/77), other wise it is mainly controlled. He reports normal blood pressure readings at home. ? ?Assessment/Plan:  ? ?1. Type 2 diabetes mellitus with hyperglycemia, without long-term current use of insulin ([name]) ?Philip [name] will continue taking Ozempic. ? ?2. Hypertension associated with type 2 diabetes mellitus ([name]) ?Philip [name] will continue taking his medication. Philip [name] is working on healthy weight loss and exercise to improve blood pressure control. We will watch for signs of hypotension as he continues his lifestyle modifications. ? ?3. Obesity, current BMI 55.7 ?Philip [name] is currently in the action stage of change. As such, his goal is to continue with weight loss efforts. He has agreed to the Category 4 Plan.  ? ?Philip [name] will adhere to the plan 80-90%, and work on intentional eating. ? ?Exercise goals: Philip [name] will continue with yard work and be very active. ? ?Behavioral modification strategies: increasing lean protein intake, decreasing simple carbohydrates, increasing vegetables, increasing water intake, decreasing eating out, no  skipping meals, meal planning and cooking strategies, keeping healthy foods in the home, and planning for success. ? ?Philip [name] has agreed to follow-up with our clinic in 3 weeks. He was informed of the importance of frequent follow-up visits to maximize his success with intensive lifestyle modifications for his multiple health conditions.  ? ?Objective:  ? ?Blood pressure (!) 155/77, pulse 79, temperature 97.7 ?F (36.5 ?C), height 5\' 11"  (1.803 m), weight (!) 399 lb (181 kg), SpO2 98 %. ?Body mass index is 55.65 kg/m?. ? ?General: Cooperative, alert, well developed, in no acute distress. ?HEENT: Conjunctivae and lids unremarkable. ?Cardiovascular: Regular rhythm.  ?Lungs: Normal work of breathing. ?Neurologic: No focal deficits.  ? ?Lab Results  ?Component Value Date  ? CREATININE 1.1 11/10/2021  ? BUN 18 11/10/2021  ? NA 138 11/10/2021  ? K 3.9 11/10/2021  ? CL 101 11/10/2021  ? CO2 27 (A) 11/10/2021  ? ?Lab Results  ?Component Value Date  ? ALT 16 11/10/2021  ? AST 19 11/10/2021  ? ALKPHOS 59 11/10/2021  ? BILITOT 0.9 12/03/2020  ? ?Lab Results  ?Component Value Date  ? HGBA1C 10.3 11/10/2021  ? HGBA1C 10.6 09/22/2021  ? ?No results found for: INSULIN ?Lab Results  ?Component Value Date  ? TSH 2.37 11/10/2021  ? ?Lab Results  ?Component Value Date  ? CHOL 238 (A) 11/10/2021  ? HDL 38 11/10/2021  ? LDLCALC 159 11/10/2021  ? TRIG 242 (A) 11/10/2021  ? ?No results found for: VD25OH ?Lab Results  ?Component Value Date  ? WBC 7.6 11/10/2021  ? HGB 12.8 (A) 11/10/2021  ? HCT 41 11/10/2021  ?  MCV 86.3 05/26/2021  ? PLT 311 11/10/2021  ? ?No results found for: IRON, TIBC, FERRITIN ? ?Attestation Statements:  ? ?Reviewed by clinician on day of visit: allergies, medications, problem list, medical history, surgical history, family history, social history, and previous encounter notes. ? ?I, Paulla Fore, CMA, am acting as transcriptionist for Dr. Corinna Capra, DO.  ? ?I have reviewed the above documentation for accuracy and  completeness, and I agree with the above. Corinna Capra, DO ? ?

## 2022-01-12 ENCOUNTER — Encounter (INDEPENDENT_AMBULATORY_CARE_PROVIDER_SITE_OTHER): Payer: Self-pay | Admitting: Bariatrics

## 2022-01-13 NOTE — Progress Notes (Deleted)
  TeleHealth Visit:  This visit was completed with telemedicine (audio/video) technology. [name] has verbally consented to this TeleHealth visit. The patient is located at home, the provider is located at home. The participants in this visit include the listed provider and patient. The visit was conducted today via MyChart video.  OBESITY Philip [name] is here to discuss his progress with his obesity treatment plan along with follow-up of his obesity related diagnoses.   Today's visit was # 5 Starting weight: 409 lbs Starting date: 11/19/2021 Weight at last in office visit: 399 lbs on 01/04/22 Total weight loss: 10 lbs at last in office visit on 01/04/22. Today's reported weight: *** lbs No weight reported.  Nutrition Plan: the Category 4 Plan.  Hunger is {EWCONTROLASSESSMENT:24261}. Cravings are {EWCONTROLASSESSMENT:24261}.  Current exercise: {exercise types:16438}  Interim History: ***  Assessment/Plan:  *** Obesity: Current BMI *** Philip [name] {CHL AMB IS/IS NOT:210130109} currently in the action stage of change. As such, his goal is to {MWMwtloss#1:210800005}.  He has agreed to {MWMwtlossportion/plan2:23431}.   Exercise goals: {MWM EXERCISE RECS:23473}  Behavioral modification strategies: {MWMwtlossdietstrategies3:23432}.  Philip [name] has agreed to follow-up with our clinic in {NUMBER 1-10:22536} weeks.   No orders of the defined types were placed in this encounter.   There are no discontinued medications.   No orders of the defined types were placed in this encounter.     Objective:   VITALS: Per patient if applicable, see vitals. GENERAL: Alert and in no acute distress. CARDIOPULMONARY: No increased WOB. Speaking in clear sentences.  PSYCH: Pleasant and cooperative. Speech normal rate and rhythm. Affect is appropriate. Insight and judgement are appropriate. Attention is focused, linear, and appropriate.  NEURO: Oriented as arrived to appointment on time with no prompting.   Lab  Results  Component Value Date   CREATININE 1.1 11/10/2021   BUN 18 11/10/2021   NA 138 11/10/2021   K 3.9 11/10/2021   CL 101 11/10/2021   CO2 27 (A) 11/10/2021   Lab Results  Component Value Date   ALT 16 11/10/2021   AST 19 11/10/2021   ALKPHOS 59 11/10/2021   BILITOT 0.9 12/03/2020   Lab Results  Component Value Date   HGBA1C 10.3 11/10/2021   HGBA1C 10.6 09/22/2021   No results found for: INSULIN Lab Results  Component Value Date   TSH 2.37 11/10/2021   Lab Results  Component Value Date   CHOL 238 (A) 11/10/2021   HDL 38 11/10/2021   LDLCALC 159 11/10/2021   TRIG 242 (A) 11/10/2021   Lab Results  Component Value Date   WBC 7.6 11/10/2021   HGB 12.8 (A) 11/10/2021   HCT 41 11/10/2021   MCV 86.3 05/26/2021   PLT 311 11/10/2021   No results found for: IRON, TIBC, FERRITIN No results found for: VD25OH  Attestation Statements:   Reviewed by clinician on day of visit: allergies, medications, problem list, medical history, surgical history, family history, social history, and previous encounter notes.  ***(delete if time-based billing not used)Time spent on visit including pre-visit chart review and post-visit charting and care was *** minutes.

## 2022-01-18 ENCOUNTER — Telehealth (INDEPENDENT_AMBULATORY_CARE_PROVIDER_SITE_OTHER): Payer: 59 | Admitting: Family Medicine

## 2022-01-19 NOTE — Progress Notes (Signed)
?TeleHealth Visit:  ?This visit was completed with telemedicine (audio/video) technology. ?Philip [name] has verbally consented to this TeleHealth visit. The patient is located at home, the provider is located at home. The participants in this visit include the listed provider and patient. The visit was conducted today via MyChart video. ? ?OBESITY ?Philip [name] is here to discuss his progress with his obesity treatment plan along with follow-up of his obesity related diagnoses.  ? ?Today's visit was # 5 ?Starting weight: 409 lbs ?Starting date: 11/19/2021 ?Weight at last in office visit: 399 lbs on 01/04/22 ?Total weight loss: 10 lbs at last in office visit on 01/04/22. ?Today's reported weight: No weight reported. ? ?Nutrition Plan: the Category 4 Plan about 60-70% of time. ?Hunger is well controlled. Cravings are well controlled.  ?Current exercise: none  ? ?Interim History: Philip [name] has long COVID and is working on getting permanent disability. ?Food recall: ?Breakfast: eggs and bacon ?Lunch: fair life protein shake ?Dinner: 8 to 10 ounces of meat and vegetable. ?He eats out 1-3 times per week but tries to make good choices.  He drinks sweet tea when he eats out. ?He notes good appetite suppression with the Ozempic. ?Assessment/Plan:  ?1. Type II Diabetes with hyperglycemia ?HgbA1c is not at goal. ?CBGs: Fasting 130-200. Does not check 2 hr PP.  ?Any episodes of hypoglycemia? no ?Medication(s): Farxiga 10 mg daily, Ozempic 0.5 mg weekly, Actos 45 mg daily. ?He reports CBGs are much better since starting Ozempic. ? ?Lab Results  ?Component Value Date  ? HGBA1C 10.3 11/10/2021  ? HGBA1C 10.6 09/22/2021  ? ?Lab Results  ?Component Value Date  ? LDLCALC 159 11/10/2021  ? CREATININE 1.1 11/10/2021  ? ? ?Plan: ?Check blood sugars 2 hours after his largest meal. ?Work on cutting out sweet tea.   ?Continue all meds at current doses. ? ?2. Philip [name] KitchenObstructive Sleep Apnea ?Philip [name] has a diagnosis of sleep apnea. He reports that he is using a  CPAP regularly.  He uses it the entire time that he sleeps. ? ?Plan: ?Continue CPAP therapy. ?Follow-up with pulmonology as directed. ?  ? ?3. Obesity: Current BMI 55.7 ?Philip [name] is currently in the action stage of change. As such, his goal is to continue with weight loss efforts.  ?He has agreed to the Category 4 Plan.  ? ?Exercise goals: No exercise has been prescribed at this time. ? ?Behavioral modification strategies: decreasing simple carbohydrates, decreasing liquid calories, and decreasing eating out. ?I encouraged him to completely cut out sweet tea. ? ?Philip [name] has agreed to follow-up with our clinic in 3 weeks.  ? ?No orders of the defined types were placed in this encounter. ? ? ?There are no discontinued medications.  ? ?No orders of the defined types were placed in this encounter. ?   ? ?Objective:  ? ?VITALS: Per patient if applicable, see vitals. ?GENERAL: Alert and in no acute distress. ?CARDIOPULMONARY: No increased WOB. Speaking in clear sentences.  ?PSYCH: Pleasant and cooperative. Speech normal rate and rhythm. Affect is appropriate. Insight and judgement are appropriate. Attention is focused, linear, and appropriate.  ?NEURO: Oriented as arrived to appointment on time with no prompting.  ? ?Lab Results  ?Component Value Date  ? CREATININE 1.1 11/10/2021  ? BUN 18 11/10/2021  ? NA 138 11/10/2021  ? K 3.9 11/10/2021  ? CL 101 11/10/2021  ? CO2 27 (A) 11/10/2021  ? ?Lab Results  ?Component Value Date  ? ALT 16 11/10/2021  ? AST 19 11/10/2021  ? ALKPHOS 59 11/10/2021  ?  BILITOT 0.9 12/03/2020  ? ?Lab Results  ?Component Value Date  ? HGBA1C 10.3 11/10/2021  ? HGBA1C 10.6 09/22/2021  ? ?No results found for: INSULIN ?Lab Results  ?Component Value Date  ? TSH 2.37 11/10/2021  ? ?Lab Results  ?Component Value Date  ? CHOL 238 (A) 11/10/2021  ? HDL 38 11/10/2021  ? LDLCALC 159 11/10/2021  ? TRIG 242 (A) 11/10/2021  ? ?Lab Results  ?Component Value Date  ? WBC 7.6 11/10/2021  ? HGB 12.8 (A) 11/10/2021  ? HCT  41 11/10/2021  ? MCV 86.3 05/26/2021  ? PLT 311 11/10/2021  ? ?No results found for: IRON, TIBC, FERRITIN ?No results found for: VD25OH ? ?Attestation Statements:  ? ?Reviewed by clinician on day of visit: allergies, medications, problem list, medical history, surgical history, family history, social history, and previous encounter notes. ? ?Time spent on visit including pre-visit chart review and post-visit charting and care was 35 minutes.  ? ? ?

## 2022-01-20 ENCOUNTER — Encounter (INDEPENDENT_AMBULATORY_CARE_PROVIDER_SITE_OTHER): Payer: Self-pay | Admitting: Family Medicine

## 2022-01-20 ENCOUNTER — Encounter (INDEPENDENT_AMBULATORY_CARE_PROVIDER_SITE_OTHER): Payer: Self-pay

## 2022-01-20 ENCOUNTER — Telehealth (INDEPENDENT_AMBULATORY_CARE_PROVIDER_SITE_OTHER): Payer: 59 | Admitting: Family Medicine

## 2022-01-20 DIAGNOSIS — G4733 Obstructive sleep apnea (adult) (pediatric): Secondary | ICD-10-CM | POA: Diagnosis not present

## 2022-01-20 DIAGNOSIS — E1165 Type 2 diabetes mellitus with hyperglycemia: Secondary | ICD-10-CM

## 2022-01-20 DIAGNOSIS — Z6841 Body Mass Index (BMI) 40.0 and over, adult: Secondary | ICD-10-CM

## 2022-01-20 DIAGNOSIS — E669 Obesity, unspecified: Secondary | ICD-10-CM

## 2022-01-20 DIAGNOSIS — Z7985 Long-term (current) use of injectable non-insulin antidiabetic drugs: Secondary | ICD-10-CM

## 2022-02-10 ENCOUNTER — Telehealth (INDEPENDENT_AMBULATORY_CARE_PROVIDER_SITE_OTHER): Payer: 59 | Admitting: Family Medicine

## 2022-02-11 ENCOUNTER — Ambulatory Visit (INDEPENDENT_AMBULATORY_CARE_PROVIDER_SITE_OTHER): Payer: 59 | Admitting: Bariatrics

## 2022-02-15 ENCOUNTER — Ambulatory Visit (INDEPENDENT_AMBULATORY_CARE_PROVIDER_SITE_OTHER): Payer: 59 | Admitting: Bariatrics

## 2022-02-15 IMAGING — CT CT CHEST HIGH RESOLUTION W/O CM
2 of 7 series · 14 of 36 positions shown, 17 images · non-contrast
Comparison: Chest CT 11/01/2008.

CLINICAL DATA: 55-year-old male with history of shortness of
breath. History of COVID infection in September 2020.

EXAM:
CT CHEST WITHOUT CONTRAST
TECHNIQUE: Multidetector CT imaging of the chest was performed following the
standard protocol without intravenous contrast. High resolution
imaging of the lungs, as well as inspiratory and expiratory imaging,
was performed.

[Series 5: high resolution · axial · 0.88mm/px · z∈[+1389,+1658]mm · 11 of 323 slices shown, 14 images]
[im 27/323  mediastinal]
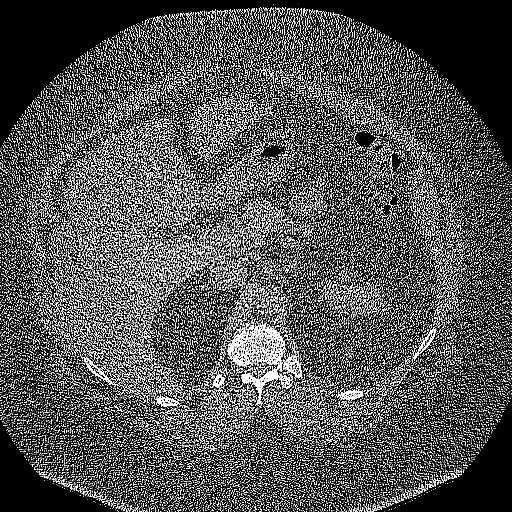
[im 27/323  lung]
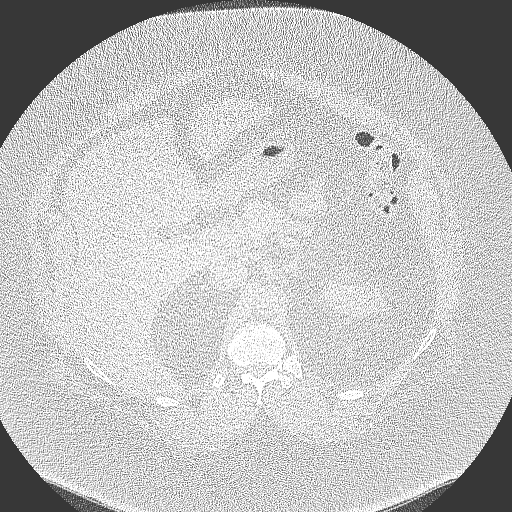
[im 54/323  lung]
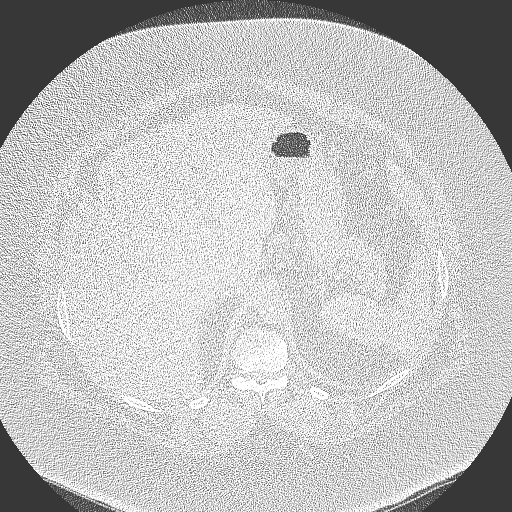
[im 81/323  lung]
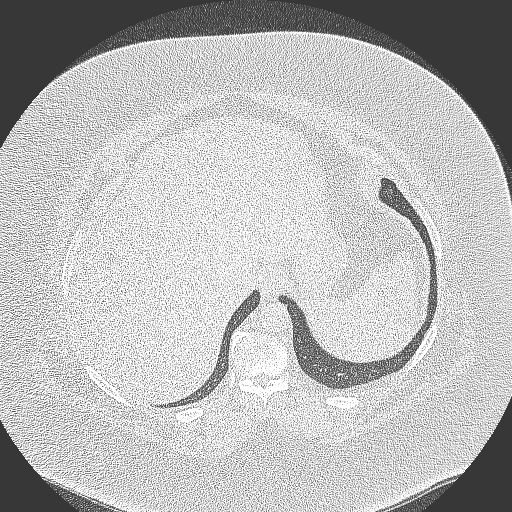
[im 108/323  lung]
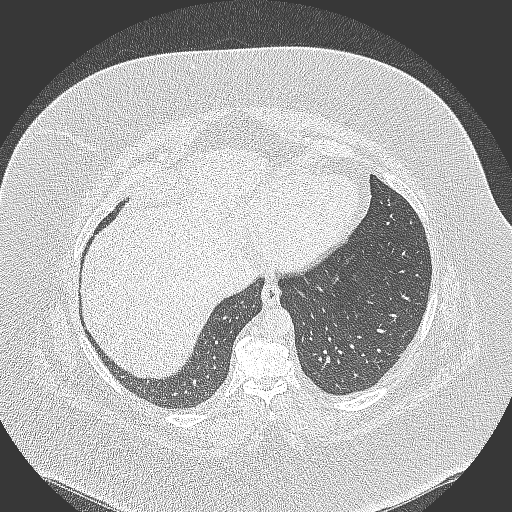
[im 135/323  mediastinal]
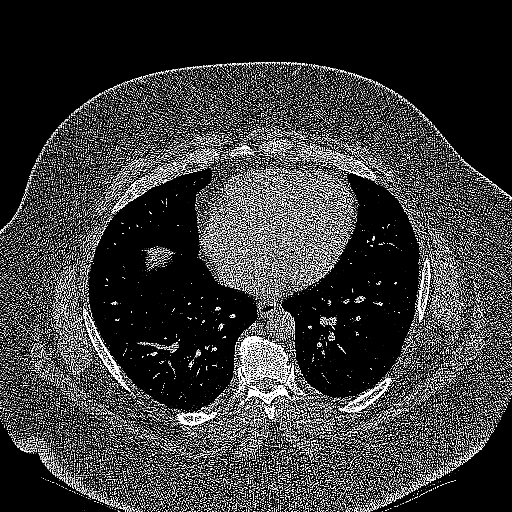
[im 135/323  lung]
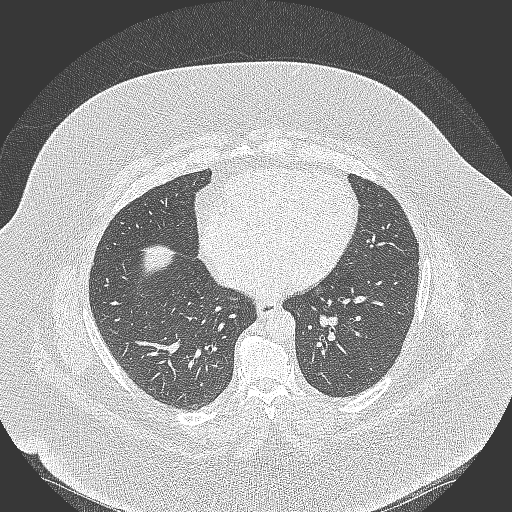
[im 162/323  lung]
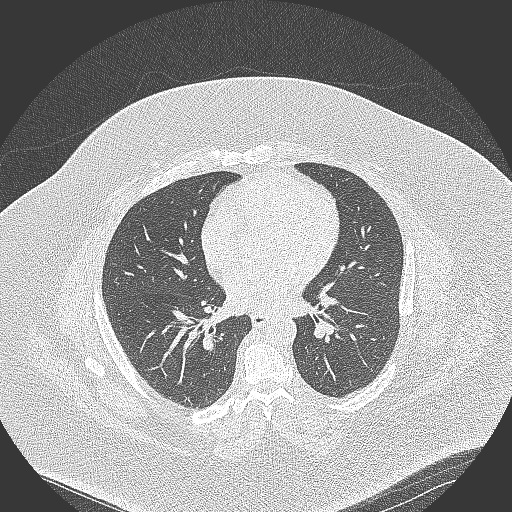
[im 188/323  lung]
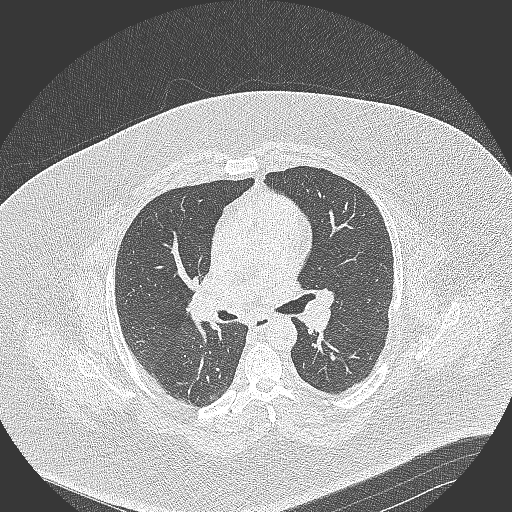
[im 215/323  lung]
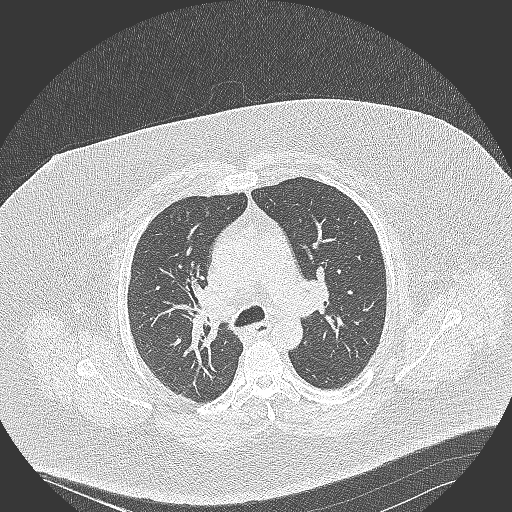
[im 242/323  mediastinal]
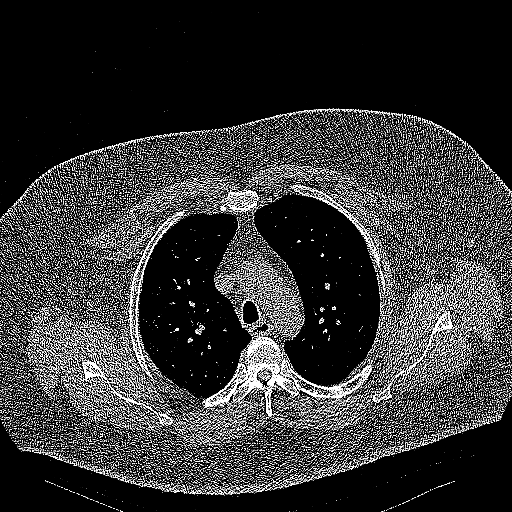
[im 242/323  lung]
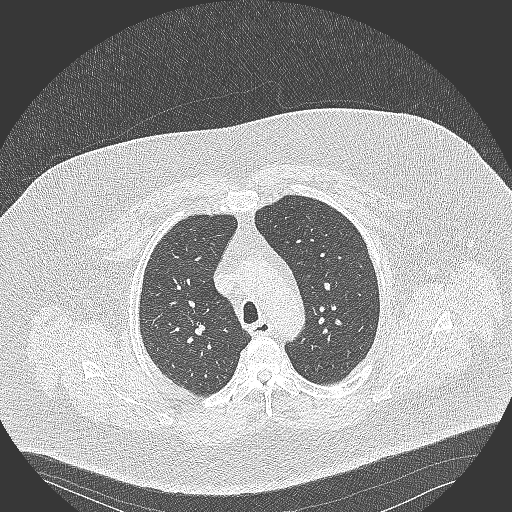
[im 269/323  lung]
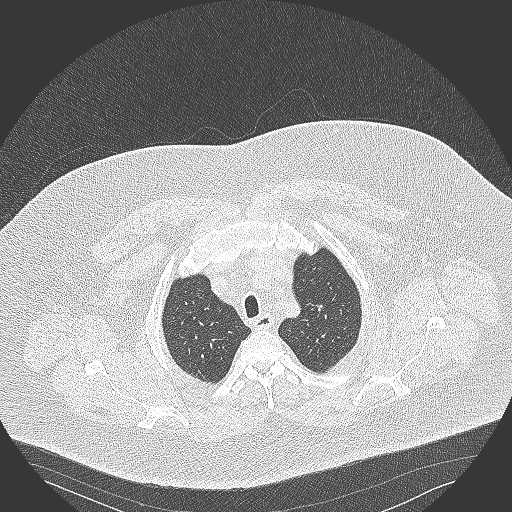
[im 296/323  lung]
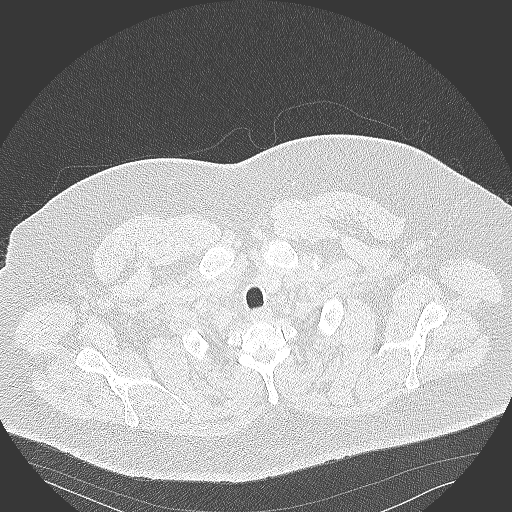

[Series 7: coronal · coronal · 0.68mm/px · 3 of 151 slices shown]
[im 31/151  lung]
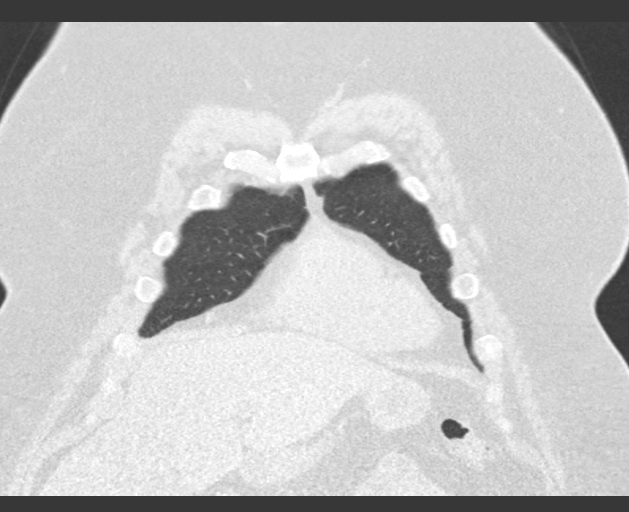
[im 61/151  lung]
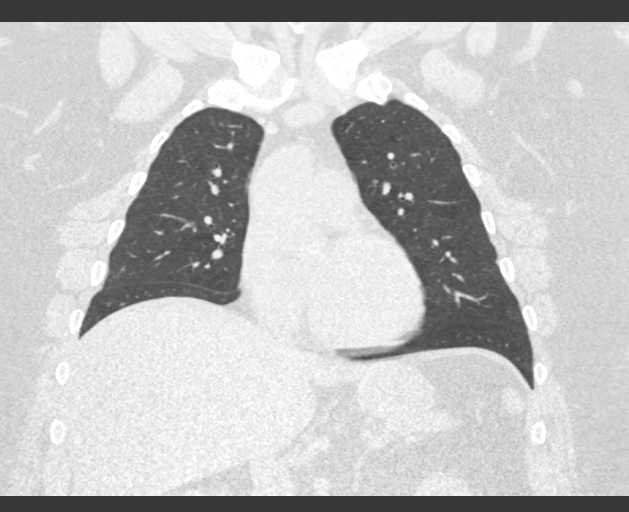
[im 91/151  lung]
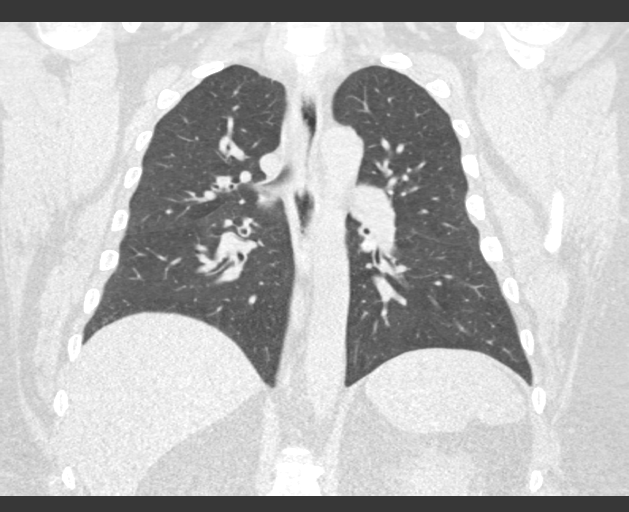

[14 of 36 positions shown; findings below may reference images not displayed]

FINDINGS: Cardiovascular: Heart size is normal. There is no significant
pericardial fluid, thickening or pericardial calcification. There is
aortic atherosclerosis, as well as atherosclerosis of the great
vessels of the mediastinum and the coronary arteries, including
calcified atherosclerotic plaque in the left anterior descending
coronary artery. Calcifications of the aortic valve.

Mediastinum/Nodes: No pathologically enlarged mediastinal or hilar
lymph nodes. Please note that accurate exclusion of hilar adenopathy
is limited on noncontrast CT scans. Esophagus is unremarkable in
appearance. No axillary lymphadenopathy.

Lungs/Pleura: High-resolution images demonstrate no significant
regions of ground-glass attenuation, septal thickening, subpleural
reticulation, parenchymal banding, traction bronchiectasis or frank
honeycombing to indicate interstitial lung disease. Inspiratory and
expiratory imaging is unremarkable. No acute consolidative airspace
disease. No pleural effusions.

Upper Abdomen: Diffuse low attenuation throughout the visualized
hepatic parenchyma, indicative of hepatic steatosis.

Musculoskeletal: There are no aggressive appearing lytic or blastic
lesions noted in the visualized portions of the skeleton.
IMPRESSION: 1. No findings to suggest interstitial lung disease.
2. Aortic atherosclerosis, in addition to left anterior descending
coronary artery disease. Please note that although the presence of
coronary artery calcium documents the presence of coronary artery
disease, the severity of this disease and any potential stenosis
cannot be assessed on this non-gated CT examination. Assessment for
potential risk factor modification, dietary therapy or pharmacologic
therapy may be warranted, if clinically indicated.
3. There are calcifications of the aortic valve. Echocardiographic
correlation for evaluation of potential valvular dysfunction may be
warranted if clinically indicated.
4. Hepatic steatosis.

Aortic Atherosclerosis ([id number]).

## 2022-02-16 ENCOUNTER — Ambulatory Visit (INDEPENDENT_AMBULATORY_CARE_PROVIDER_SITE_OTHER): Payer: 59 | Admitting: Bariatrics

## 2022-03-01 ENCOUNTER — Ambulatory Visit (INDEPENDENT_AMBULATORY_CARE_PROVIDER_SITE_OTHER): Payer: Self-pay | Admitting: Bariatrics

## 2022-03-02 ENCOUNTER — Other Ambulatory Visit (INDEPENDENT_AMBULATORY_CARE_PROVIDER_SITE_OTHER): Payer: Self-pay | Admitting: Bariatrics

## 2022-03-02 ENCOUNTER — Ambulatory Visit (INDEPENDENT_AMBULATORY_CARE_PROVIDER_SITE_OTHER): Payer: 59 | Admitting: Bariatrics

## 2022-03-02 ENCOUNTER — Encounter (INDEPENDENT_AMBULATORY_CARE_PROVIDER_SITE_OTHER): Payer: Self-pay | Admitting: Bariatrics

## 2022-03-02 VITALS — BP 146/80 | HR 65 | Temp 97.8°F | Ht 71.0 in | Wt 387.0 lb

## 2022-03-02 DIAGNOSIS — E1165 Type 2 diabetes mellitus with hyperglycemia: Secondary | ICD-10-CM

## 2022-03-02 DIAGNOSIS — E1169 Type 2 diabetes mellitus with other specified complication: Secondary | ICD-10-CM

## 2022-03-02 DIAGNOSIS — E669 Obesity, unspecified: Secondary | ICD-10-CM

## 2022-03-02 DIAGNOSIS — Z6841 Body Mass Index (BMI) 40.0 and over, adult: Secondary | ICD-10-CM

## 2022-03-02 DIAGNOSIS — E785 Hyperlipidemia, unspecified: Secondary | ICD-10-CM | POA: Diagnosis not present

## 2022-03-02 DIAGNOSIS — Z7985 Long-term (current) use of injectable non-insulin antidiabetic drugs: Secondary | ICD-10-CM

## 2022-03-02 MED ORDER — OZEMPIC (0.25 OR 0.5 MG/DOSE) 2 MG/1.5ML ~~LOC~~ SOPN: 0.5000 mg | PEN_INJECTOR | SUBCUTANEOUS | 0 refills | Status: DC

## 2022-03-03 NOTE — Progress Notes (Unsigned)
Chief Complaint:   OBESITY [name] is here to discuss his progress with his obesity treatment plan along with follow-up of his obesity related diagnoses. Philip [name] is on the Category 4 Plan and states he is following his eating plan approximately 90% of the time. Philip [name] states he is walking for 20 minutes 7 times per week.  Today's visit was #: 6 Starting weight: 409 lbs Starting date: 11/19/2021 Today's weight: 387 lbs Today's date: 03/02/2022 Total lbs lost to date: 22 Total lbs lost since last in-office visit: 12  Interim History: Philip [name] is down an additional 12 pounds since his last visit.  He is getting adequate protein and water.  Subjective:   1. Type 2 diabetes mellitus with hyperglycemia, without long-term current use of insulin ([name]) Philip [name] is taking Ozempic and he denies side effects.  He notes Ozempic is helping with his appetite.  2. Hyperlipidemia associated with type 2 diabetes mellitus ([name]) Philip [name] is currently taking Lipitor.  Assessment/Plan:   1. Type 2 diabetes mellitus with hyperglycemia, without long-term current use of insulin ([name]) Philip [name] will continue Ozempic, and we will refill for 1 month.  - Semaglutide,0.25 or 0.5MG /DOS, (OZEMPIC, 0.25 OR 0.5 MG/DOSE,) 2 MG/1.5ML SOPN; Inject 0.5 mg into the skin once a week.  Dispense: 0.5 mL; Refill: 0  2. Hyperlipidemia associated with type 2 diabetes mellitus ([name]) Philip [name] will continue Lipitor as prescribed.  3. Obesity, current BMI 54.0 Philip [name] is currently in the action stage of change. As such, his goal is to continue with weight loss efforts. He has agreed to the Category 4 Plan.   Meal planning and intentional eating were discussed.  Eliminate regular soda.  Snack sheet and make your life easier and healthier sheets were given.  Exercise goals: As is.   Behavioral modification strategies: increasing lean protein intake, decreasing simple carbohydrates, increasing vegetables, increasing water intake, decreasing  eating out, no skipping meals, meal planning and cooking strategies, keeping healthy foods in the home, and planning for success.  Philip [name] has agreed to follow-up with our clinic in 3 weeks with myself or with William Hamburger, NP. He was informed of the importance of frequent follow-up visits to maximize his success with intensive lifestyle modifications for his multiple health conditions.   Objective:   Pulse 65, temperature 97.8 F (36.6 C), height 5\' 11"  (1.803 m), weight (!) 387 lb (175.5 kg), SpO2 95 %. Body mass index is 53.98 kg/m.  General: Cooperative, alert, well developed, in no acute distress. HEENT: Conjunctivae and lids unremarkable. Cardiovascular: Regular rhythm.  Lungs: Normal work of breathing. Neurologic: No focal deficits.   Lab Results  Component Value Date   CREATININE 1.1 11/10/2021   BUN 18 11/10/2021   NA 138 11/10/2021   K 3.9 11/10/2021   CL 101 11/10/2021   CO2 27 (A) 11/10/2021   Lab Results  Component Value Date   ALT 16 11/10/2021   AST 19 11/10/2021   ALKPHOS 59 11/10/2021   BILITOT 0.9 12/03/2020   Lab Results  Component Value Date   HGBA1C 10.3 11/10/2021   HGBA1C 10.6 09/22/2021   No results found for: "INSULIN" Lab Results  Component Value Date   TSH 2.37 11/10/2021   Lab Results  Component Value Date   CHOL 238 (A) 11/10/2021   HDL 38 11/10/2021   LDLCALC 159 11/10/2021   TRIG 242 (A) 11/10/2021   No results found for: "VD25OH" Lab Results  Component Value Date   WBC 7.6 11/10/2021   HGB  12.8 (A) 11/10/2021   HCT 41 11/10/2021   MCV 86.3 05/26/2021   PLT 311 11/10/2021   No results found for: "IRON", "TIBC", "FERRITIN"  Attestation Statements:   Reviewed by clinician on day of visit: allergies, medications, problem list, medical history, surgical history, family history, social history, and previous encounter notes.   Trude Mcburney, am acting as Energy manager for [employer], DO.  I have reviewed the above  documentation for accuracy and completeness, and I agree with the above. -  ***

## 2022-03-04 ENCOUNTER — Telehealth (INDEPENDENT_AMBULATORY_CARE_PROVIDER_SITE_OTHER): Payer: 59 | Admitting: Family Medicine

## 2022-03-06 ENCOUNTER — Encounter (INDEPENDENT_AMBULATORY_CARE_PROVIDER_SITE_OTHER): Payer: Self-pay | Admitting: Bariatrics

## 2022-03-30 ENCOUNTER — Ambulatory Visit (INDEPENDENT_AMBULATORY_CARE_PROVIDER_SITE_OTHER): Payer: 59 | Admitting: Bariatrics

## 2022-03-30 ENCOUNTER — Encounter (INDEPENDENT_AMBULATORY_CARE_PROVIDER_SITE_OTHER): Payer: Self-pay | Admitting: Bariatrics

## 2022-03-30 VITALS — BP 134/85 | HR 57 | Temp 97.7°F | Ht 71.0 in | Wt 385.0 lb

## 2022-03-30 DIAGNOSIS — E1159 Type 2 diabetes mellitus with other circulatory complications: Secondary | ICD-10-CM

## 2022-03-30 DIAGNOSIS — E669 Obesity, unspecified: Secondary | ICD-10-CM

## 2022-03-30 DIAGNOSIS — I152 Hypertension secondary to endocrine disorders: Secondary | ICD-10-CM

## 2022-03-30 DIAGNOSIS — E1165 Type 2 diabetes mellitus with hyperglycemia: Secondary | ICD-10-CM | POA: Diagnosis not present

## 2022-03-30 DIAGNOSIS — E559 Vitamin D deficiency, unspecified: Secondary | ICD-10-CM

## 2022-03-30 DIAGNOSIS — E7849 Other hyperlipidemia: Secondary | ICD-10-CM | POA: Diagnosis not present

## 2022-03-30 DIAGNOSIS — Z7985 Long-term (current) use of injectable non-insulin antidiabetic drugs: Secondary | ICD-10-CM

## 2022-03-30 DIAGNOSIS — Z6841 Body Mass Index (BMI) 40.0 and over, adult: Secondary | ICD-10-CM

## 2022-03-30 MED ORDER — SEMAGLUTIDE (1 MG/DOSE) 4 MG/3ML ~~LOC~~ SOPN: 1.0000 mg | PEN_INJECTOR | SUBCUTANEOUS | 0 refills | Status: DC

## 2022-03-31 ENCOUNTER — Encounter (INDEPENDENT_AMBULATORY_CARE_PROVIDER_SITE_OTHER): Payer: Self-pay

## 2022-03-31 ENCOUNTER — Telehealth (INDEPENDENT_AMBULATORY_CARE_PROVIDER_SITE_OTHER): Payer: Self-pay

## 2022-03-31 LAB — INSULIN, RANDOM: INSULIN: 6 u[IU]/mL (ref 2.6–24.9)

## 2022-03-31 LAB — COMPREHENSIVE METABOLIC PANEL
ALT: 13 IU/L (ref 0–44)
AST: 14 IU/L (ref 0–40)
Albumin/Globulin Ratio: 1.2 (ref 1.2–2.2)
Albumin: 3.8 g/dL (ref 3.8–4.9)
Alkaline Phosphatase: 85 IU/L (ref 44–121)
BUN/Creatinine Ratio: 18 (ref 9–20)
BUN: 22 mg/dL (ref 6–24)
Bilirubin Total: 0.7 mg/dL (ref 0.0–1.2)
CO2: 25 mmol/L (ref 20–29)
Calcium: 9.9 mg/dL (ref 8.7–10.2)
Chloride: 98 mmol/L (ref 96–106)
Creatinine, Ser: 1.24 mg/dL (ref 0.76–1.27)
Globulin, Total: 3.3 g/dL (ref 1.5–4.5)
Glucose: 300 mg/dL — ABNORMAL HIGH (ref 70–99)
Potassium: 4.9 mmol/L (ref 3.5–5.2)
Sodium: 137 mmol/L (ref 134–144)
Total Protein: 7.1 g/dL (ref 6.0–8.5)
eGFR: 69 mL/min/{1.73_m2} (ref >59)

## 2022-03-31 LAB — LIPID PANEL WITH LDL/HDL RATIO
Cholesterol, Total: 198 mg/dL (ref 100–199)
HDL: 35 mg/dL — ABNORMAL LOW (ref >39)
LDL Chol Calc (NIH): 120 mg/dL — ABNORMAL HIGH (ref 0–99)
LDL/HDL Ratio: 3.4 ratio (ref 0.0–3.6)
Triglycerides: 245 mg/dL — ABNORMAL HIGH (ref 0–149)
VLDL Cholesterol Cal: 43 mg/dL — ABNORMAL HIGH (ref 5–40)

## 2022-03-31 LAB — HEMOGLOBIN A1C
Est. average glucose Bld gHb Est-mCnc: 332 mg/dL
Hgb A1c MFr Bld: 13.2 % — ABNORMAL HIGH (ref 4.8–5.6)

## 2022-03-31 LAB — VITAMIN D 25 HYDROXY (VIT D DEFICIENCY, FRACTURES): Vit D, 25-Hydroxy: 21.8 ng/mL — ABNORMAL LOW (ref 30.0–100.0)

## 2022-03-31 NOTE — Telephone Encounter (Signed)
Left message for patient to return call.

## 2022-03-31 NOTE — Telephone Encounter (Signed)
-----   Message from Roswell Nickel, DO sent at 03/31/2022 10:31 AM EDT ----- Call pt, His A1c is extremely high. Is he taking all of his medications ?  ----- Message ----- From: Interface, [facility] Lab Results In Sent: 03/31/2022   5:38 AM EDT To: Roswell Nickel, DO

## 2022-04-05 NOTE — Progress Notes (Unsigned)
Chief Complaint:   OBESITY Philip [name] is here to discuss his progress with his obesity treatment plan along with follow-up of his obesity related diagnoses. Philip [name] is on the Category 4 Plan and states he is following his eating plan approximately 90% of the time. Philip [name] states he is doing 0 minutes 0 times per week.  Today's visit was #: 7 Starting weight: 409 lbs Starting date: 11/19/2021 Today's weight: 385 lbs Today's date: 03/30/2022 Total lbs lost to date: 24 Total lbs lost since last in-office visit: 2  Interim History: Philip [name] is down 2 pounds since his last visit, and he is doing well overall.  Subjective:   1. Type 2 diabetes mellitus with hyperglycemia, without long-term current use of insulin ([name]) Philip [name] is currently taking [place] and Ozempic.  He denies side effects.  He notes his medication is helping with his appetite.  2. Hypertension associated with type 2 diabetes mellitus ([name]) Philip [name]'s blood pressure is reasonably well controlled.  3. Other hyperlipidemia Philip [name] is currently taking Lipitor.  4. Vitamin D deficiency Philip [name] is not on vitamin D currently.  Assessment/Plan:   1. Type 2 diabetes mellitus with hyperglycemia, without long-term current use of insulin ([name]) We will check labs today.  Philip [name] agreed to increase Ozempic from 0.5 mg to 1 mg once weekly, and we will refill for 1 month.  He will decrease his bread intake.  - Semaglutide, 1 MG/DOSE, 4 MG/3ML SOPN; Inject 1 mg as directed once a week.  Dispense: 3 mL; Refill: 0 - Hemoglobin A1c - Insulin, random - Comprehensive metabolic panel  2. Hypertension associated with type 2 diabetes mellitus ([name]) Philip [name] will continue his medications as directed.  3. Other hyperlipidemia We will check labs today.  Philip [name] will continue his medications, and we will follow-up at his next visit.  - Lipid Panel With LDL/HDL Ratio  4. Vitamin D deficiency We will check labs today, and we will follow-up at Philip [name]'s next  visit.  - VITAMIN D 25 Hydroxy (Vit-D Deficiency, Fractures)  5. Obesity, current BMI 53.8 Philip [name] is currently in the action stage of change. As such, his goal is to continue with weight loss efforts. He has agreed to the Category 4 Plan.   No planning was discussed.  He will continue to adhere closely to the plan.  Exercise goals: No exercise has been prescribed at this time.  Behavioral modification strategies: increasing lean protein intake, decreasing simple carbohydrates, increasing vegetables, increasing water intake, decreasing liquid calories, decreasing eating out, no skipping meals, meal planning and cooking strategies, keeping healthy foods in the home, and planning for success.  Philip [name] has agreed to follow-up with our clinic in 3 to 4 weeks. He was informed of the importance of frequent follow-up visits to maximize his success with intensive lifestyle modifications for his multiple health conditions.   Philip [name] was informed we would discuss his lab results at his next visit unless there is a critical issue that needs to be addressed sooner. Philip [name] agreed to keep his next visit at the agreed upon time to discuss these results.  Objective:   Blood pressure 134/85, pulse (!) 57, temperature 97.7 F (36.5 C), height 5\' 11"  (1.803 m), weight (!) 385 lb (174.6 kg), SpO2 94 %. Body mass index is 53.7 kg/m.  General: Cooperative, alert, well developed, in no acute distress. HEENT: Conjunctivae and lids unremarkable. Cardiovascular: Regular rhythm.  Lungs: Normal work of breathing. Neurologic: No focal deficits.   Lab Results  Component Value Date  CREATININE 1.24 03/30/2022   BUN 22 03/30/2022   NA 137 03/30/2022   K 4.9 03/30/2022   CL 98 03/30/2022   CO2 25 03/30/2022   Lab Results  Component Value Date   ALT 13 03/30/2022   AST 14 03/30/2022   ALKPHOS 85 03/30/2022   BILITOT 0.7 03/30/2022   Lab Results  Component Value Date   HGBA1C 13.2 (H) 03/30/2022   HGBA1C  10.3 11/10/2021   HGBA1C 10.6 09/22/2021   Lab Results  Component Value Date   INSULIN 6.0 03/30/2022   Lab Results  Component Value Date   TSH 2.37 11/10/2021   Lab Results  Component Value Date   CHOL 198 03/30/2022   HDL 35 (L) 03/30/2022   LDLCALC 120 (H) 03/30/2022   TRIG 245 (H) 03/30/2022   Lab Results  Component Value Date   VD25OH 21.8 (L) 03/30/2022   Lab Results  Component Value Date   WBC 7.6 11/10/2021   HGB 12.8 (A) 11/10/2021   HCT 41 11/10/2021   MCV 86.3 05/26/2021   PLT 311 11/10/2021   No results found for: "IRON", "TIBC", "FERRITIN"  Attestation Statements:   Reviewed by clinician on day of visit: allergies, medications, problem list, medical history, surgical history, family history, social history, and previous encounter notes.   Trude Mcburney, am acting as Energy manager for [employer], DO.  I have reviewed the above documentation for accuracy and completeness, and I agree with the above. Corinna Capra, DO

## 2022-04-06 ENCOUNTER — Encounter (INDEPENDENT_AMBULATORY_CARE_PROVIDER_SITE_OTHER): Payer: Self-pay | Admitting: Bariatrics

## 2022-04-21 ENCOUNTER — Encounter (INDEPENDENT_AMBULATORY_CARE_PROVIDER_SITE_OTHER): Payer: Self-pay

## 2022-05-03 ENCOUNTER — Encounter (INDEPENDENT_AMBULATORY_CARE_PROVIDER_SITE_OTHER): Payer: Self-pay | Admitting: Bariatrics

## 2022-05-03 ENCOUNTER — Ambulatory Visit (INDEPENDENT_AMBULATORY_CARE_PROVIDER_SITE_OTHER): Payer: 59 | Admitting: Bariatrics

## 2022-05-03 VITALS — BP 139/80 | HR 64 | Temp 97.9°F | Ht 71.0 in | Wt 383.0 lb

## 2022-05-03 DIAGNOSIS — E669 Obesity, unspecified: Secondary | ICD-10-CM

## 2022-05-03 DIAGNOSIS — E1165 Type 2 diabetes mellitus with hyperglycemia: Secondary | ICD-10-CM

## 2022-05-03 DIAGNOSIS — E785 Hyperlipidemia, unspecified: Secondary | ICD-10-CM | POA: Diagnosis not present

## 2022-05-03 DIAGNOSIS — E559 Vitamin D deficiency, unspecified: Secondary | ICD-10-CM | POA: Diagnosis not present

## 2022-05-03 DIAGNOSIS — Z6841 Body Mass Index (BMI) 40.0 and over, adult: Secondary | ICD-10-CM

## 2022-05-03 DIAGNOSIS — Z7985 Long-term (current) use of injectable non-insulin antidiabetic drugs: Secondary | ICD-10-CM

## 2022-05-03 MED ORDER — SEMAGLUTIDE (1 MG/DOSE) 4 MG/3ML ~~LOC~~ SOPN: 1.0000 mg | PEN_INJECTOR | SUBCUTANEOUS | 0 refills | Status: DC

## 2022-05-03 MED ORDER — VITAMIN D (ERGOCALCIFEROL) 1.25 MG (50000 UNIT) PO CAPS: 50000.0000 [IU] | ORAL_CAPSULE | ORAL | 0 refills | Status: DC

## 2022-05-05 NOTE — Progress Notes (Unsigned)
Chief Complaint:   OBESITY Philip [name] is here to discuss his progress with his obesity treatment plan along with follow-up of his obesity related diagnoses. Philip [name] is on the Category 4 Plan and states Philip [name] is following his eating plan approximately 90% of the time. Philip [name] states Philip [name] is walking for 60 minutes 7 times per week.  Today's visit was #: 8 Starting weight: 409 lbs Starting date: 11/19/2021 Today's weight: 383 lbs Today's date: 05/03/22 Total lbs lost to date: 26 Total lbs lost since last in-office visit: -2  Interim History: Philip [name] is down an additional 2 pounds and doing well overall.  Subjective:   1. Type 2 diabetes mellitus with hyperglycemia, without long-term current use of insulin ([name]) Taking Ozempic.  2. Vitamin D deficiency Vitamin D is 21.8.  3. Dyslipidemia Taking Lipitor.  Triglycerides are elevated, HDL is low.  Assessment/Plan:   1. Type 2 diabetes mellitus with hyperglycemia, without long-term current use of insulin ([name]) Refill - Semaglutide, 1 MG/DOSE, 4 MG/3ML SOPN; Inject 1 mg as directed once a week.  Dispense: 3 mL; Refill: 0  2. Vitamin D deficiency 1.  Continue vitamin D. 2.  Refill - Vitamin D, Ergocalciferol, (DRISDOL) 1.25 MG (50000 UNIT) CAPS capsule; Take 1 capsule (50,000 Units total) by mouth every 7 (seven) days.  Dispense: 5 capsule; Refill: 0  3. Dyslipidemia 1. Continue to work on diet and exercise. 2.  Continue Lipitor at higher dose (40 mg) per PCP.  4. Obesity, current BMI 53.4 1.  Meal planning 2.  Intentional eating 3.  Reviewed labs 03/30/2028 (CMP, lipids, vitamin D).  Philip [name] is currently in the action stage of change. As such, his goal is to maintain weight for now. Philip [name] has agreed to the Category 4 Plan.   Exercise goals: Walking daily.  Behavioral modification strategies: increasing lean protein intake, decreasing simple carbohydrates, increasing vegetables, increasing water intake, decreasing eating out, no skipping meals,  meal planning and cooking strategies, keeping healthy foods in the home, and planning for success.  Philip [name] has agreed to follow-up with our clinic in 3-4 weeks. Philip [name] was informed of the importance of frequent follow-up visits to maximize his success with intensive lifestyle modifications for his multiple health conditions.    Objective:   Blood pressure 139/80, pulse 64, temperature 97.9 F (36.6 C), height 5\' 11"  (1.803 m), weight (!) 383 lb (173.7 kg), SpO2 98 %. Body mass index is 53.42 kg/m.  General: Cooperative, alert, well developed, in no acute distress. HEENT: Conjunctivae and lids unremarkable. Cardiovascular: Regular rhythm.  Lungs: Normal work of breathing. Neurologic: No focal deficits.   Lab Results  Component Value Date   CREATININE 1.24 03/30/2022   BUN 22 03/30/2022   NA 137 03/30/2022   K 4.9 03/30/2022   CL 98 03/30/2022   CO2 25 03/30/2022   Lab Results  Component Value Date   ALT 13 03/30/2022   AST 14 03/30/2022   ALKPHOS 85 03/30/2022   BILITOT 0.7 03/30/2022   Lab Results  Component Value Date   HGBA1C 13.2 (H) 03/30/2022   HGBA1C 10.3 11/10/2021   HGBA1C 10.6 09/22/2021   Lab Results  Component Value Date   INSULIN 6.0 03/30/2022   Lab Results  Component Value Date   TSH 2.37 11/10/2021   Lab Results  Component Value Date   CHOL 198 03/30/2022   HDL 35 (L) 03/30/2022   LDLCALC 120 (H) 03/30/2022   TRIG 245 (H) 03/30/2022   Lab Results  Component  Value Date   VD25OH 21.8 (L) 03/30/2022   Lab Results  Component Value Date   WBC 7.6 11/10/2021   HGB 12.8 (A) 11/10/2021   HCT 41 11/10/2021   MCV 86.3 05/26/2021   PLT 311 11/10/2021   No results found for: "IRON", "TIBC", "FERRITIN"   Attestation Statements:   Reviewed by clinician on day of visit: allergies, medications, problem list, medical history, surgical history, family history, social history, and previous encounter notes.  I, Dawn Whitmire, FNP-C, am acting as  transcriptionist for Dr. Corinna Capra.  I have reviewed the above documentation for accuracy and completeness, and I agree with the above. Corinna Capra, DO

## 2022-05-06 ENCOUNTER — Encounter (INDEPENDENT_AMBULATORY_CARE_PROVIDER_SITE_OTHER): Payer: Self-pay | Admitting: Bariatrics

## 2022-05-31 ENCOUNTER — Ambulatory Visit (INDEPENDENT_AMBULATORY_CARE_PROVIDER_SITE_OTHER): Payer: 59 | Admitting: Bariatrics

## 2022-05-31 ENCOUNTER — Encounter: Payer: Self-pay | Admitting: Bariatrics

## 2022-05-31 VITALS — BP 159/79 | HR 63 | Temp 97.7°F | Ht 71.0 in | Wt 384.0 lb

## 2022-05-31 DIAGNOSIS — Z7985 Long-term (current) use of injectable non-insulin antidiabetic drugs: Secondary | ICD-10-CM

## 2022-05-31 DIAGNOSIS — E559 Vitamin D deficiency, unspecified: Secondary | ICD-10-CM

## 2022-05-31 DIAGNOSIS — E785 Hyperlipidemia, unspecified: Secondary | ICD-10-CM | POA: Diagnosis not present

## 2022-05-31 DIAGNOSIS — E1165 Type 2 diabetes mellitus with hyperglycemia: Secondary | ICD-10-CM | POA: Diagnosis not present

## 2022-05-31 DIAGNOSIS — E669 Obesity, unspecified: Secondary | ICD-10-CM | POA: Diagnosis not present

## 2022-05-31 DIAGNOSIS — Z6841 Body Mass Index (BMI) 40.0 and over, adult: Secondary | ICD-10-CM

## 2022-05-31 MED ORDER — SEMAGLUTIDE (2 MG/DOSE) 8 MG/3ML ~~LOC~~ SOPN: 2.0000 mg | PEN_INJECTOR | SUBCUTANEOUS | 0 refills | Status: DC

## 2022-05-31 MED ORDER — VITAMIN D (ERGOCALCIFEROL) 1.25 MG (50000 UNIT) PO CAPS: 50000.0000 [IU] | ORAL_CAPSULE | ORAL | 0 refills | Status: DC

## 2022-06-01 LAB — COMPREHENSIVE METABOLIC PANEL
ALT: 18 IU/L (ref 0–44)
AST: 17 IU/L (ref 0–40)
Albumin/Globulin Ratio: 1.1 — ABNORMAL LOW (ref 1.2–2.2)
Albumin: 3.7 g/dL — ABNORMAL LOW (ref 3.8–4.9)
Alkaline Phosphatase: 78 IU/L (ref 44–121)
BUN/Creatinine Ratio: 20 (ref 9–20)
BUN: 24 mg/dL (ref 6–24)
Bilirubin Total: 0.6 mg/dL (ref 0.0–1.2)
CO2: 26 mmol/L (ref 20–29)
Calcium: 9.5 mg/dL (ref 8.7–10.2)
Chloride: 99 mmol/L (ref 96–106)
Creatinine, Ser: 1.18 mg/dL (ref 0.76–1.27)
Globulin, Total: 3.3 g/dL (ref 1.5–4.5)
Glucose: 211 mg/dL — ABNORMAL HIGH (ref 70–99)
Potassium: 4.6 mmol/L (ref 3.5–5.2)
Sodium: 137 mmol/L (ref 134–144)
Total Protein: 7 g/dL (ref 6.0–8.5)
eGFR: 73 mL/min/{1.73_m2} (ref >59)

## 2022-06-01 LAB — LIPID PANEL WITH LDL/HDL RATIO
Cholesterol, Total: 208 mg/dL — ABNORMAL HIGH (ref 100–199)
HDL: 35 mg/dL — ABNORMAL LOW (ref >39)
LDL Chol Calc (NIH): 117 mg/dL — ABNORMAL HIGH (ref 0–99)
LDL/HDL Ratio: 3.3 ratio (ref 0.0–3.6)
Triglycerides: 319 mg/dL — ABNORMAL HIGH (ref 0–149)
VLDL Cholesterol Cal: 56 mg/dL — ABNORMAL HIGH (ref 5–40)

## 2022-06-01 LAB — VITAMIN D 25 HYDROXY (VIT D DEFICIENCY, FRACTURES): Vit D, 25-Hydroxy: 26.5 ng/mL — ABNORMAL LOW (ref 30.0–100.0)

## 2022-06-01 LAB — INSULIN, RANDOM: INSULIN: 8.9 u[IU]/mL (ref 2.6–24.9)

## 2022-06-01 NOTE — Progress Notes (Deleted)
.  mw

## 2022-06-02 NOTE — Progress Notes (Signed)
Chief Complaint:   OBESITY Philip [name] is here to discuss his progress with his obesity treatment plan along with follow-up of his obesity related diagnoses. Philip [name] is on the Category 4 Plan and states he is following his eating plan approximately 90% of the time. Philip [name] states he is walking for 30 minutes 7 times per week.  Today's visit was #: 9 Starting weight: 409 lbs Starting date: 11/20/2018 Today's weight: 384 lbs Today's date: 05/31/2022 Total lbs lost to date: 25 Total lbs lost since last in-office visit: 0  Interim History: Philip [name] is up 1 pound.  He is not struggling with carbohydrates and he is increasing his protein.  Subjective:   1. Type 2 diabetes mellitus with hyperglycemia, without long-term current use of insulin ([name]) Philip [name] is taking Ozempic 1 mg as directed.  He denies any lows.  2. Vitamin D deficiency Philip [name] denies side effects, but he notes minimal sun exposure.  3. Dyslipidemia Philip [name] is currently taking Lipitor.  Assessment/Plan:   1. Type 2 diabetes mellitus with hyperglycemia, without long-term current use of insulin ([name]) We will check labs today.  Philip [name] agreed to increase Ozempic to 2 mg once weekly, and we will refill for 1 month.  - Semaglutide, 2 MG/DOSE, 8 MG/3ML SOPN; Inject 2 mg as directed once a week.  Dispense: 3 mL; Refill: 0 - Comprehensive metabolic panel - Insulin, random  2. Vitamin D deficiency We will check labs today, and we will refill prescription vitamin D for 1 month.  - Vitamin D, Ergocalciferol, (DRISDOL) 1.25 MG (50000 UNIT) CAPS capsule; Take 1 capsule (50,000 Units total) by mouth every 7 (seven) days.  Dispense: 5 capsule; Refill: 0 - VITAMIN D 25 Hydroxy (Vit-D Deficiency, Fractures)  3. Dyslipidemia We will check labs today.  Philip [name] will continue Lipitor, and he will increase MUFAs and PUFAs.  - Lipid Panel With LDL/HDL Ratio  4. Obesity, current BMI 53.6 Philip [name] is currently in the action stage of change. As such, his  goal is to continue with weight loss efforts. He has agreed to the Category 4 Plan.   Meal planning and intentional eating were discussed.  He is to follow his plan about 90%.  Decrease carbohydrates.  Exercise goals: As is.   Behavioral modification strategies: increasing lean protein intake, decreasing simple carbohydrates, increasing vegetables, increasing water intake, decreasing eating out, no skipping meals, meal planning and cooking strategies, keeping healthy foods in the home, and planning for success.  Lido has agreed to follow-up with our clinic in 4 weeks. He was informed of the importance of frequent follow-up visits to maximize his success with intensive lifestyle modifications for his multiple health conditions.   Philip [name] was informed we would discuss his lab results at his next visit unless there is a critical issue that needs to be addressed sooner. Philip [name] agreed to keep his next visit at the agreed upon time to discuss these results.  Objective:   Blood pressure (!) 159/79, pulse 63, temperature 97.7 F (36.5 C), height 5\' 11"  (1.803 m), weight (!) 384 lb (174.2 kg), SpO2 98 %. Body mass index is 53.56 kg/m.  General: Cooperative, alert, well developed, in no acute distress. HEENT: Conjunctivae and lids unremarkable. Cardiovascular: Regular rhythm.  Lungs: Normal work of breathing. Neurologic: No focal deficits.   Lab Results  Component Value Date   CREATININE 1.18 05/31/2022   BUN 24 05/31/2022   NA 137 05/31/2022   K 4.6 05/31/2022   CL 99 05/31/2022   CO2  26 05/31/2022   Lab Results  Component Value Date   ALT 18 05/31/2022   AST 17 05/31/2022   ALKPHOS 78 05/31/2022   BILITOT 0.6 05/31/2022   Lab Results  Component Value Date   HGBA1C 13.2 (H) 03/30/2022   HGBA1C 10.3 11/10/2021   HGBA1C 10.6 09/22/2021   Lab Results  Component Value Date   INSULIN 8.9 05/31/2022   INSULIN 6.0 03/30/2022   Lab Results  Component Value Date   TSH 2.37  11/10/2021   Lab Results  Component Value Date   CHOL 208 (H) 05/31/2022   HDL 35 (L) 05/31/2022   LDLCALC 117 (H) 05/31/2022   TRIG 319 (H) 05/31/2022   Lab Results  Component Value Date   VD25OH 26.5 (L) 05/31/2022   VD25OH 21.8 (L) 03/30/2022   Lab Results  Component Value Date   WBC 7.6 11/10/2021   HGB 12.8 (A) 11/10/2021   HCT 41 11/10/2021   MCV 86.3 05/26/2021   PLT 311 11/10/2021   No results found for: "IRON", "TIBC", "FERRITIN"  Attestation Statements:   Reviewed by clinician on day of visit: allergies, medications, problem list, medical history, surgical history, family history, social history, and previous encounter notes.   Trude Mcburney, am acting as Energy manager for [employer], DO.  I have reviewed the above documentation for accuracy and completeness, and I agree with the above. Corinna Capra, DO

## 2022-06-03 ENCOUNTER — Encounter: Payer: Self-pay | Admitting: Bariatrics

## 2022-06-29 ENCOUNTER — Ambulatory Visit (INDEPENDENT_AMBULATORY_CARE_PROVIDER_SITE_OTHER): Payer: 59 | Admitting: Bariatrics

## 2022-06-29 ENCOUNTER — Encounter: Payer: Self-pay | Admitting: Bariatrics

## 2022-06-29 VITALS — BP 177/97 | HR 64 | Temp 98.1°F | Ht 71.0 in | Wt 379.0 lb

## 2022-06-29 DIAGNOSIS — Z7984 Long term (current) use of oral hypoglycemic drugs: Secondary | ICD-10-CM

## 2022-06-29 DIAGNOSIS — E1165 Type 2 diabetes mellitus with hyperglycemia: Secondary | ICD-10-CM | POA: Diagnosis not present

## 2022-06-29 DIAGNOSIS — E559 Vitamin D deficiency, unspecified: Secondary | ICD-10-CM | POA: Diagnosis not present

## 2022-06-29 DIAGNOSIS — I152 Hypertension secondary to endocrine disorders: Secondary | ICD-10-CM | POA: Diagnosis not present

## 2022-06-29 DIAGNOSIS — E1159 Type 2 diabetes mellitus with other circulatory complications: Secondary | ICD-10-CM | POA: Diagnosis not present

## 2022-06-29 DIAGNOSIS — E669 Obesity, unspecified: Secondary | ICD-10-CM

## 2022-06-29 DIAGNOSIS — Z7985 Long-term (current) use of injectable non-insulin antidiabetic drugs: Secondary | ICD-10-CM

## 2022-06-29 DIAGNOSIS — Z6841 Body Mass Index (BMI) 40.0 and over, adult: Secondary | ICD-10-CM

## 2022-06-29 MED ORDER — LOSARTAN POTASSIUM-HCTZ 100-12.5 MG PO TABS: 1.0000 | ORAL_TABLET | Freq: Every day | ORAL | 0 refills | Status: DC

## 2022-06-29 MED ORDER — SEMAGLUTIDE (2 MG/DOSE) 8 MG/3ML ~~LOC~~ SOPN: 2.0000 mg | PEN_INJECTOR | SUBCUTANEOUS | 0 refills | Status: DC

## 2022-06-29 MED ORDER — VITAMIN D (ERGOCALCIFEROL) 1.25 MG (50000 UNIT) PO CAPS: 50000.0000 [IU] | ORAL_CAPSULE | ORAL | 0 refills | Status: DC

## 2022-06-30 ENCOUNTER — Other Ambulatory Visit (INDEPENDENT_AMBULATORY_CARE_PROVIDER_SITE_OTHER): Payer: Self-pay | Admitting: Bariatrics

## 2022-06-30 DIAGNOSIS — E559 Vitamin D deficiency, unspecified: Secondary | ICD-10-CM

## 2022-07-01 ENCOUNTER — Encounter (HOSPITAL_BASED_OUTPATIENT_CLINIC_OR_DEPARTMENT_OTHER): Payer: Self-pay

## 2022-07-01 ENCOUNTER — Emergency Department (HOSPITAL_BASED_OUTPATIENT_CLINIC_OR_DEPARTMENT_OTHER): Payer: 59 | Admitting: Radiology

## 2022-07-01 ENCOUNTER — Other Ambulatory Visit: Payer: Self-pay

## 2022-07-01 ENCOUNTER — Emergency Department (HOSPITAL_BASED_OUTPATIENT_CLINIC_OR_DEPARTMENT_OTHER)
Admission: EM | Admit: 2022-07-01 | Discharge: 2022-07-01 | Disposition: A | Discharge status: Home / Self Care | Payer: 59 | Attending: Emergency Medicine | Admitting: Emergency Medicine

## 2022-07-01 DIAGNOSIS — Z79899 Other long term (current) drug therapy: Secondary | ICD-10-CM | POA: Insufficient documentation

## 2022-07-01 DIAGNOSIS — I10 Essential (primary) hypertension: Secondary | ICD-10-CM | POA: Diagnosis not present

## 2022-07-01 DIAGNOSIS — Z794 Long term (current) use of insulin: Secondary | ICD-10-CM | POA: Diagnosis not present

## 2022-07-01 DIAGNOSIS — Z8616 Personal history of COVID-19: Secondary | ICD-10-CM | POA: Insufficient documentation

## 2022-07-01 DIAGNOSIS — E119 Type 2 diabetes mellitus without complications: Secondary | ICD-10-CM | POA: Diagnosis not present

## 2022-07-01 DIAGNOSIS — U071 COVID-19: Secondary | ICD-10-CM | POA: Diagnosis not present

## 2022-07-01 DIAGNOSIS — R059 Cough, unspecified: Secondary | ICD-10-CM | POA: Diagnosis present

## 2022-07-01 LAB — RESP PANEL BY RT-PCR (FLU A&B, COVID) ARPGX2
Influenza A by PCR: NEGATIVE
Influenza B by PCR: NEGATIVE
SARS Coronavirus 2 by RT PCR: POSITIVE — AB

## 2022-07-01 LAB — GROUP A STREP BY PCR: Group A Strep by PCR: NOT DETECTED

## 2022-07-01 MED ORDER — NIRMATRELVIR/RITONAVIR (PAXLOVID)TABLET: 3.0000 | ORAL_TABLET | Freq: Two times a day (BID) | ORAL | 0 refills | Status: AC

## 2022-07-01 MED ORDER — NIRMATRELVIR/RITONAVIR (PAXLOVID)TABLET: 3.0000 | ORAL_TABLET | Freq: Two times a day (BID) | ORAL | 0 refills | Status: DC

## 2022-07-01 MED ORDER — OXYMETAZOLINE HCL 0.05 % NA SOLN: 1.0000 | Freq: Two times a day (BID) | NASAL | 0 refills | Status: AC

## 2022-07-01 NOTE — Discharge Instructions (Addendum)
Today you were seen in the emergency department for your cough and shortness of breath.    In the emergency department you were found to have Philip [name].    At home, please please take the medication that we have prescribed you to treat your COVID called Paxlovid.  While you are on this medication please hold your atorvastatin and resume the atorvastatin 3 days after completing your Paxlovid.  Please note the Paxlovid may affect your blood sugar levels so please ensure to watch these closely and discuss with your primary doctor if you notice any changes.  Follow-up with your primary doctor in 2-3 days regarding your visit.    Return immediately to the emergency department if you experience any of the following: Severe chest pain, difficulty breathing, or any other concerning symptoms.    Thank you for visiting our Emergency Department. It was a pleasure taking care of you today.

## 2022-07-01 NOTE — ED Provider Notes (Signed)
Philip [name]   CSN: [id number] Arrival date & time: 07/01/22  1534     History  Chief Complaint  Patient presents with   Cough    Philip [name] is a 56 y.o. male.  56 year old male with a history of diabetes, hypertension, and hyperlipidemia who presents to the emergency department with 1 week of a dry cough.  Says it is also been accompanied by congestion and sinus pressure.  Has tried over-the-counter meds including Claritin and Tylenol without improvement.  Did have a fever of 103 this week.  Son and wife have also been sick at home and his wife tested positive for strep throat.  Says that he has had chest discomfort and shortness of breath since remote history of COVID but no new chest pain or shortness of breath.  States he did get his COVID booster yesterday and is fully vaccinated.   Past Medical History:  Diagnosis Date   Diabetes mellitus without complication ([place])    Fluid retention    High cholesterol    Hypertension    Sleep apnea    SOB (shortness of breath)       Home Medications Prior to Admission medications   Medication Sig Start Date End Date Taking? Authorizing Provider  oxymetazoline (AFRIN NASAL SPRAY) 0.05 % nasal spray Place 1 spray into both nostrils 2 (two) times daily. 07/01/22  Yes Fransico Meadow, MD  albuterol (VENTOLIN HFA) 108 (90 Base) MCG/ACT inhaler Inhale 2 puffs into the lungs every 4 (four) hours as needed for wheezing or shortness of breath. 09/23/21   Freddi Starr, MD  atenolol (TENORMIN) 50 MG tablet Take 50 mg by mouth daily. 11/12/20   [provider]  atorvastatin (LIPITOR) 40 MG tablet Take 40 mg by mouth daily. 03/20/22   [provider]  FARXIGA 10 MG TABS tablet Take 10 mg by mouth daily. 11/12/20   [provider]  Fluticasone-Umeclidin-Vilant (TRELEGY ELLIPTA) 100-62.5-25 MCG/ACT AEPB Inhale 1 puff into the lungs daily. 11/27/21   Freddi Starr, MD   furosemide (LASIX) 20 MG tablet Take 1 tablet (20 mg total) by mouth daily. 07/31/21   Freddi Starr, MD  loratadine (CLARITIN) 10 MG tablet Take 10 mg by mouth daily.    [provider]  losartan-hydrochlorothiazide (HYZAAR) 100-12.5 MG tablet Take 1 tablet by mouth daily. 06/29/22   Georgia Lopes, DO  nirmatrelvir/ritonavir EUA (PAXLOVID) 20 x 150 MG & 10 x 100MG  TABS Take 3 tablets by mouth 2 (two) times daily for 5 days. Patient GFR is 73. Take nirmatrelvir (150 mg) two tablets twice daily for 5 days and ritonavir (100 mg) one tablet twice daily for 5 days. 07/01/22 07/06/22  Fransico Meadow, MD  pioglitazone (ACTOS) 45 MG tablet Take 45 mg by mouth daily. 01/15/21   [provider]  Semaglutide, 2 MG/DOSE, 8 MG/3ML SOPN Inject 2 mg as directed once a week. 06/29/22   Jearld Lesch A, DO  Vitamin D, Ergocalciferol, (DRISDOL) 1.25 MG (50000 UNIT) CAPS capsule Take 1 capsule (50,000 Units total) by mouth every 7 (seven) days. 06/29/22   Jearld Lesch A, DO      Allergies    Metformin and Codeine    Review of Systems   Review of Systems  Physical Exam Updated Vital Signs BP (!) 149/87 (BP Location: Right Arm)   Pulse 66   Temp 98.6 F (37 C) (Oral)   Resp 17   Ht 5\' 11"  (  1.803 m)   Wt (!) 171.9 kg   SpO2 100%   BMI 52.86 kg/m  Physical Exam Vitals and nursing [name] reviewed.  Constitutional:      General: He is not in acute distress.    Appearance: He is well-developed. He is obese. He is not ill-appearing.     Comments: Coughing frequently during exam  HENT:     Head: Normocephalic and atraumatic.     Right Ear: External ear normal.     Left Ear: External ear normal.     Nose: Nose normal.  Eyes:     Extraocular Movements: Extraocular movements intact.     Conjunctiva/sclera: Conjunctivae normal.     Pupils: Pupils are equal, round, and reactive to light.  Cardiovascular:     Rate and Rhythm: Normal rate and regular rhythm.     Heart sounds:  Normal heart sounds.  Pulmonary:     Effort: Pulmonary effort is normal. No respiratory distress.     Breath sounds: Normal breath sounds.  Musculoskeletal:        General: No swelling.     Cervical back: Normal range of motion and neck supple.  Skin:    General: Skin is warm and dry.     Capillary Refill: Capillary refill takes less than 2 seconds.  Neurological:     Mental Status: He is alert. Mental status is at baseline.  Psychiatric:        Mood and Affect: Mood normal.        Behavior: Behavior normal.     ED Results / Procedures / Treatments   Labs (all labs ordered are listed, but only abnormal results are displayed) Labs Reviewed  RESP PANEL BY RT-PCR (FLU A&B, COVID) ARPGX2 - Abnormal; Notable for the following components:      Result Value   SARS Coronavirus 2 by RT PCR POSITIVE (*)    All other components within normal limits  GROUP A STREP BY PCR    EKG None  Radiology DG Chest 2 View  Result Date: 07/01/2022 CLINICAL DATA:  Cough, fever.  COVID positive. EXAM: CHEST - 2 VIEW COMPARISON:  CT chest dated March 05, 2021 FINDINGS: The heart size and mediastinal contours are within normal limits. Both lungs are clear. The visualized skeletal structures are unremarkable. IMPRESSION: No focal consolidation or pleural effusion. Electronically Signed   By: Larose Hires D.O.   On: 07/01/2022 17:14    Procedures Procedures   Medications Ordered in ED Medications - No data to display  ED Course/ Medical Decision Making/ A&P                           Medical Decision Making Amount and/or Complexity of Data Reviewed Radiology: ordered.  Risk OTC drugs.   Philip [name] is a 56 y.o. male with comorbidities that complicate the patient evaluation including DM, HTN, HLD who presents with chief complaint of cough, congestion, and fever.  This patient presents to the ED for concern of complaints listed in HPI, this involves an extensive number of treatment options,  and is a complaint that carries with it a high risk of complications and morbidity.   Initial Ddx:  URI, sinusitis, pneumonia  MDM:  Initially felt the patient likely had a URI given his recent sick contacts and upper respiratory symptoms.  Will obtain chest x-ray to evaluate for possible pneumonia given his fever and group A strep since his wife tested positive  recently.  Plan:  COVID/flu Rapid strep Chest x-ray  ED Summary/Re-evaluation:  Patient underwent the above work-up which did show that he had COVID-19.  Group A strep and chest x-ray did not reveal additional signs of infection.  Discussed medications with pharmacy and will have him hold his atorvastatin while taking Paxlovid that will be prescribed for him.  He was instructed to resume his atorvastatin 2 to 3 days after he finishes his Paxlovid.  Also instructed to follow-up with his primary care doctor regarding his emergency department visit in the next 2 to 3 days.  Dispo: DC Home. Return precautions discussed including, but not limited to, those listed in the AVS. Allowed pt time to ask questions which were answered fully prior to dc.   Additional history obtained from spouse Records reviewed Care Everywhere I independently reviewed the following imaging with scope of interpretation limited to determining acute life threatening conditions related to emergency care: Chest x-ray, which revealed no acute abnormality  I have reviewed the patients home medications and made adjustments as needed Consults:  Pharmacy  Final Clinical Impression(s) / ED Diagnoses Final diagnoses:  COVID-19    Rx / DC Orders ED Discharge Orders          Ordered    nirmatrelvir/ritonavir EUA (PAXLOVID) 20 x 150 MG & 10 x 100MG  TABS  2 times daily,   Status:  Discontinued        07/01/22 1812    oxymetazoline (AFRIN NASAL SPRAY) 0.05 % nasal spray  2 times daily        07/01/22 1816    nirmatrelvir/ritonavir EUA (PAXLOVID) 20 x 150 MG & 10 x  100MG  TABS  2 times daily        07/01/22 1819              , MD 07/02/22 1933

## 2022-07-01 NOTE — ED Triage Notes (Signed)
Patient here POV from Home.  Endorses Cough, Headache since Monday. Worse Last PM.  Fever of 103 last PM.  NAD noted during Triage. A&Ox4. GCS 15. Ambulatory.

## 2022-07-01 NOTE — ED Notes (Signed)
Pt stated that he would cough each time he took a deep rteath and stated that he didn't want to breath deep for auscultation. Assessement based on normal breathing.

## 2022-07-05 ENCOUNTER — Encounter: Payer: Self-pay | Admitting: Pulmonary Disease

## 2022-07-05 NOTE — Progress Notes (Signed)
Chief Complaint:   OBESITY Philip [name] is here to discuss his progress with his obesity treatment plan along with follow-up of his obesity related diagnoses. Philip [name] is on the Category 4 Plan and states he is following his eating plan approximately 80-90% of the time. Philip [name] states he is walking for 60 minutes 7 times per week.  Today's visit was #: 10 Starting weight: 409 lbs Starting date: 11/20/2018 Today's weight: 379 lbs Today's date: 06/29/2022 Total lbs lost to date: 30 Total lbs lost since last in-office visit: 5  Interim History: Philip [name] is down 5 lbs since his last visit.   Subjective:   1. Type 2 diabetes mellitus with hyperglycemia, without long-term current use of insulin ([name]) Philip [name]'s last A1c was 9.8 per the patient. He is taking Ozempic and [place] and he denies lows.   2. Vitamin D deficiency Philip [name] is taking Vitamin D and he notes decreased energy.   3. Hypertension associated with type 2 diabetes mellitus ([name]) Philip [name]'s blood pressure is high today 182/84. He took his medications this morning. He is taking atenolol, Hyzaar, and Lasix (for edema).   Assessment/Plan:   1. Type 2 diabetes mellitus with hyperglycemia, without long-term current use of insulin ([name]) Philip [name] will continue her medications, and we will refill Ozempic 2 mg once weekly for 1 month.   - Semaglutide, 2 MG/DOSE, 8 MG/3ML SOPN; Inject 2 mg as directed once a week.  Dispense: 3 mL; Refill: 0  2. Vitamin D deficiency We will refill prescription Vitamin D 50,000 IU every week for 1 month, and add Vitamin D OTC 2,000 IU daily. Philip [name] will follow-up for routine testing of Vitamin D, at least 2-3 times per year to avoid over-replacement.  - Vitamin D, Ergocalciferol, (DRISDOL) 1.25 MG (50000 UNIT) CAPS capsule; Take 1 capsule (50,000 Units total) by mouth every 7 (seven) days.  Dispense: 5 capsule; Refill: 0  3. Hypertension associated with type 2 diabetes mellitus ([name]) Philip [name] is to check his blood  pressure at home and call his PCP if his blood pressure stay elevated. He agreed to increase Hyzaar to 100-12.5 mg once daily with a 90 day supply.   - losartan-hydrochlorothiazide (HYZAAR) 100-12.5 MG tablet; Take 1 tablet by mouth daily.  Dispense: 90 tablet; Refill: 0  4. Obesity, current BMI 52.9 Philip [name] is currently in the action stage of change. As such, his goal is to continue with weight loss efforts. He has agreed to the Category 4 Plan.   Meal planning and mindful eating were discussed. Reviewed labs from 05/31/2022 CMP, lipids, insulin, and glucose.   Exercise goals: As is.   Behavioral modification strategies: increasing lean protein intake, decreasing simple carbohydrates, increasing vegetables, increasing water intake, decreasing eating out, no skipping meals, meal planning and cooking strategies, and keeping healthy foods in the home.  Philip [name] has agreed to follow-up with our clinic in 3 weeks. He was informed of the importance of frequent follow-up visits to maximize his success with intensive lifestyle modifications for his multiple health conditions.   Objective:   Blood pressure (!) 177/97, pulse 64, temperature 98.1 F (36.7 C), height 5\' 11"  (1.803 m), weight (!) 379 lb (171.9 kg), SpO2 100 %. Body mass index is 52.86 kg/m.  General: Cooperative, alert, well developed, in no acute distress. HEENT: Conjunctivae and lids unremarkable. Cardiovascular: Regular rhythm.  Lungs: Normal work of breathing. Neurologic: No focal deficits.   Lab Results  Component Value Date   CREATININE 1.18 05/31/2022   BUN 24 05/31/2022  NA 137 05/31/2022   K 4.6 05/31/2022   CL 99 05/31/2022   CO2 26 05/31/2022   Lab Results  Component Value Date   ALT 18 05/31/2022   AST 17 05/31/2022   ALKPHOS 78 05/31/2022   BILITOT 0.6 05/31/2022   Lab Results  Component Value Date   HGBA1C 13.2 (H) 03/30/2022   HGBA1C 10.3 11/10/2021   HGBA1C 10.6 09/22/2021   Lab Results  Component  Value Date   INSULIN 8.9 05/31/2022   INSULIN 6.0 03/30/2022   Lab Results  Component Value Date   TSH 2.37 11/10/2021   Lab Results  Component Value Date   CHOL 208 (H) 05/31/2022   HDL 35 (L) 05/31/2022   LDLCALC 117 (H) 05/31/2022   TRIG 319 (H) 05/31/2022   Lab Results  Component Value Date   VD25OH 26.5 (L) 05/31/2022   VD25OH 21.8 (L) 03/30/2022   Lab Results  Component Value Date   WBC 7.6 11/10/2021   HGB 12.8 (A) 11/10/2021   HCT 41 11/10/2021   MCV 86.3 05/26/2021   PLT 311 11/10/2021   No results found for: "IRON", "TIBC", "FERRITIN"  Attestation Statements:   Reviewed by clinician on day of visit: allergies, medications, problem list, medical history, surgical history, family history, social history, and previous encounter notes.   Wilhemena Durie, am acting as Location manager for [employer], DO.  I have reviewed the above documentation for accuracy and completeness, and I agree with the above. Jearld Lesch, DO

## 2022-07-06 ENCOUNTER — Encounter: Payer: Self-pay | Admitting: Bariatrics

## 2022-07-22 ENCOUNTER — Encounter: Payer: Self-pay | Admitting: Bariatrics

## 2022-07-22 ENCOUNTER — Ambulatory Visit (INDEPENDENT_AMBULATORY_CARE_PROVIDER_SITE_OTHER): Payer: 59 | Admitting: Bariatrics

## 2022-07-22 VITALS — BP 160/84 | HR 65 | Temp 97.9°F | Ht 71.0 in | Wt 374.0 lb

## 2022-07-22 DIAGNOSIS — Z7985 Long-term (current) use of injectable non-insulin antidiabetic drugs: Secondary | ICD-10-CM

## 2022-07-22 DIAGNOSIS — Z6841 Body Mass Index (BMI) 40.0 and over, adult: Secondary | ICD-10-CM

## 2022-07-22 DIAGNOSIS — E1165 Type 2 diabetes mellitus with hyperglycemia: Secondary | ICD-10-CM

## 2022-07-22 DIAGNOSIS — E559 Vitamin D deficiency, unspecified: Secondary | ICD-10-CM

## 2022-07-22 DIAGNOSIS — I152 Hypertension secondary to endocrine disorders: Secondary | ICD-10-CM

## 2022-07-22 DIAGNOSIS — E669 Obesity, unspecified: Secondary | ICD-10-CM

## 2022-07-22 DIAGNOSIS — E1159 Type 2 diabetes mellitus with other circulatory complications: Secondary | ICD-10-CM | POA: Diagnosis not present

## 2022-07-22 MED ORDER — VITAMIN D (ERGOCALCIFEROL) 1.25 MG (50000 UNIT) PO CAPS: 50000.0000 [IU] | ORAL_CAPSULE | ORAL | 0 refills | Status: DC

## 2022-07-29 ENCOUNTER — Telehealth (INDEPENDENT_AMBULATORY_CARE_PROVIDER_SITE_OTHER): Payer: Self-pay | Admitting: Bariatrics

## 2022-07-29 NOTE — Telephone Encounter (Signed)
[facility] would like to speak with a CMA or Dr.Brown regarding this patient  Phone number: [phone] ext [phone]. If someone could give her a call, that would be great!

## 2022-08-03 ENCOUNTER — Ambulatory Visit (INDEPENDENT_AMBULATORY_CARE_PROVIDER_SITE_OTHER): Payer: 59 | Admitting: Pulmonary Disease

## 2022-08-03 ENCOUNTER — Encounter: Payer: Self-pay | Admitting: Pulmonary Disease

## 2022-08-03 VITALS — BP 138/70 | HR 66 | Ht 71.0 in | Wt 389.0 lb

## 2022-08-03 DIAGNOSIS — U071 COVID-19: Secondary | ICD-10-CM

## 2022-08-03 DIAGNOSIS — J4540 Moderate persistent asthma, uncomplicated: Secondary | ICD-10-CM

## 2022-08-03 DIAGNOSIS — J984 Other disorders of lung: Secondary | ICD-10-CM | POA: Diagnosis not present

## 2022-08-03 DIAGNOSIS — G4733 Obstructive sleep apnea (adult) (pediatric): Secondary | ICD-10-CM | POA: Diagnosis not present

## 2022-08-03 DIAGNOSIS — J208 Acute bronchitis due to other specified organisms: Secondary | ICD-10-CM

## 2022-08-03 MED ORDER — PREDNISONE 10 MG PO TABS: ORAL_TABLET | ORAL | 0 refills | Status: AC

## 2022-08-03 MED ORDER — BENZONATATE 200 MG PO CAPS: 200.0000 mg | ORAL_CAPSULE | Freq: Three times a day (TID) | ORAL | 2 refills | Status: DC | PRN

## 2022-08-03 MED ORDER — AZITHROMYCIN 250 MG PO TABS: ORAL_TABLET | ORAL | 0 refills | Status: DC
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
## 2022-08-03 NOTE — Patient Instructions (Signed)
Start Zpak for 5 days  Start Prednisone taper for acute bronchitis   Use tessalon perles as needed for cough  Continue on trelegy ellipta 1 puff daily  We will repeat breathing tests at follow up  Follow up in 2 months with pulmonary function tests

## 2022-08-04 ENCOUNTER — Other Ambulatory Visit (HOSPITAL_COMMUNITY): Payer: Self-pay

## 2022-08-04 NOTE — Progress Notes (Signed)
Chief Complaint:   OBESITY Philip [name] is here to discuss his progress with his obesity treatment plan along with follow-up of his obesity related diagnoses. Philip [name] is on the Category 4 Plan and states he is following his eating plan approximately 80% of the time. Philip [name] states he is doing 0 minutes 0 times per week.  Today's visit was #: 11 Starting weight: 409 lbs Starting date: 11/20/2018 Today's weight: 374 lbs Today's date: 07/22/2022 Total lbs lost to date: 35 Total lbs lost since last in-office visit: 5  Interim History: Philip [name] is down 5 pounds since his last visit.  His exercise has decreased since he had COVID.  Subjective:   1. Vitamin D deficiency Philip [name] is taking prescription vitamin D as directed.  2. Type 2 diabetes mellitus with hyperglycemia, without long-term current use of insulin ([name]) Philip [name] is on Ozempic but he was unable to find it at a Insurance claims handler.  He is on Actos and [place].  3. Hypertension associated with type 2 diabetes mellitus ([name]) Philip [name] is taking atenolol and Hyzaar.  His blood pressure is elevated today at 177/89, as he did not take his blood pressure medications today. His repeat blood pressure is 160/84. His blood pressure was elevated at his last office visit.  Assessment/Plan:   1. Vitamin D deficiency Philip [name] will continue prescription vitamin D 50,000 IU every week, and we will refill for 1 month.  - Vitamin D, Ergocalciferol, (DRISDOL) 1.25 MG (50000 UNIT) CAPS capsule; Take 1 capsule (50,000 Units total) by mouth every 7 (seven) days.  Dispense: 5 capsule; Refill: 0  2. Type 2 diabetes mellitus with hyperglycemia, without long-term current use of insulin ([name]) Philip [name] will work on keeping his carbohydrates low.  3. Hypertension associated with type 2 diabetes mellitus ([name]) Philip [name] will continue his medications as directed, and he will check his blood pressure at home.  4. Obesity, current BMI 52.2 Philip [name] is currently in the action stage of  change. As such, his goal is to continue with weight loss efforts. He has agreed to the Category 4 Plan.   He will make better choices, and keep his water and protein intake high.  Exercise goals: No exercise has been prescribed at this time.  Behavioral modification strategies: increasing lean protein intake, decreasing simple carbohydrates, increasing vegetables, increasing water intake, decreasing eating out, no skipping meals, meal planning and cooking strategies, keeping healthy foods in the home, and planning for success.  Philip [name] has agreed to follow-up with our clinic in 4 weeks. He was informed of the importance of frequent follow-up visits to maximize his success with intensive lifestyle modifications for his multiple health conditions.   Objective:   Blood pressure (!) 160/84, pulse 65, temperature 97.9 F (36.6 C), height 5\' 11"  (1.803 m), weight (!) 374 lb (169.6 kg), SpO2 100 %. Body mass index is 52.16 kg/m.  General: Cooperative, alert, well developed, in no acute distress. HEENT: Conjunctivae and lids unremarkable. Cardiovascular: Regular rhythm.  Lungs: Normal work of breathing. Neurologic: No focal deficits.   Lab Results  Component Value Date   CREATININE 1.18 05/31/2022   BUN 24 05/31/2022   NA 137 05/31/2022   K 4.6 05/31/2022   CL 99 05/31/2022   CO2 26 05/31/2022   Lab Results  Component Value Date   ALT 18 05/31/2022   AST 17 05/31/2022   ALKPHOS 78 05/31/2022   BILITOT 0.6 05/31/2022   Lab Results  Component Value Date   HGBA1C 13.2 (H) 03/30/2022  HGBA1C 10.3 11/10/2021   HGBA1C 10.6 09/22/2021   Lab Results  Component Value Date   INSULIN 8.9 05/31/2022   INSULIN 6.0 03/30/2022   Lab Results  Component Value Date   TSH 2.37 11/10/2021   Lab Results  Component Value Date   CHOL 208 (H) 05/31/2022   HDL 35 (L) 05/31/2022   LDLCALC 117 (H) 05/31/2022   TRIG 319 (H) 05/31/2022   Lab Results  Component Value Date   VD25OH 26.5 (L)  05/31/2022   VD25OH 21.8 (L) 03/30/2022   Lab Results  Component Value Date   WBC 7.6 11/10/2021   HGB 12.8 (A) 11/10/2021   HCT 41 11/10/2021   MCV 86.3 05/26/2021   PLT 311 11/10/2021   No results found for: "IRON", "TIBC", "FERRITIN"  Attestation Statements:   Reviewed by clinician on day of visit: allergies, medications, problem list, medical history, surgical history, family history, social history, and previous encounter notes.   Philip [name], am acting as Energy manager for [employer], DO.  I have reviewed the above documentation for accuracy and completeness, and I agree with the above. Philip Capra, DO

## 2022-08-19 ENCOUNTER — Encounter: Payer: Self-pay | Admitting: Bariatrics

## 2022-08-19 ENCOUNTER — Ambulatory Visit (INDEPENDENT_AMBULATORY_CARE_PROVIDER_SITE_OTHER): Payer: 59 | Admitting: Bariatrics

## 2022-08-19 VITALS — BP 146/80 | HR 68 | Temp 98.1°F | Ht 71.0 in | Wt 377.0 lb

## 2022-08-19 DIAGNOSIS — E785 Hyperlipidemia, unspecified: Secondary | ICD-10-CM

## 2022-08-19 DIAGNOSIS — E559 Vitamin D deficiency, unspecified: Secondary | ICD-10-CM | POA: Diagnosis not present

## 2022-08-19 DIAGNOSIS — I10 Essential (primary) hypertension: Secondary | ICD-10-CM | POA: Diagnosis not present

## 2022-08-19 DIAGNOSIS — Z7984 Long term (current) use of oral hypoglycemic drugs: Secondary | ICD-10-CM

## 2022-08-19 DIAGNOSIS — E1165 Type 2 diabetes mellitus with hyperglycemia: Secondary | ICD-10-CM | POA: Diagnosis not present

## 2022-08-19 DIAGNOSIS — Z6841 Body Mass Index (BMI) 40.0 and over, adult: Secondary | ICD-10-CM

## 2022-08-19 DIAGNOSIS — Z7985 Long-term (current) use of injectable non-insulin antidiabetic drugs: Secondary | ICD-10-CM

## 2022-08-19 DIAGNOSIS — E669 Obesity, unspecified: Secondary | ICD-10-CM

## 2022-08-19 MED ORDER — SEMAGLUTIDE (2 MG/DOSE) 8 MG/3ML ~~LOC~~ SOPN: 2.0000 mg | PEN_INJECTOR | SUBCUTANEOUS | 0 refills | Status: DC

## 2022-08-19 MED ORDER — VITAMIN D (ERGOCALCIFEROL) 1.25 MG (50000 UNIT) PO CAPS: 50000.0000 [IU] | ORAL_CAPSULE | ORAL | 0 refills | Status: DC

## 2022-08-25 ENCOUNTER — Telehealth: Payer: Self-pay | Admitting: Pulmonary Disease

## 2022-08-25 NOTE — Telephone Encounter (Signed)
Received request for office notes - faxed notes from 08/03/22 appointment with Dr. Francine Graven to [facility] - fax# [phone].

## 2022-08-31 ENCOUNTER — Other Ambulatory Visit: Payer: Self-pay | Admitting: Pulmonary Disease

## 2022-08-31 DIAGNOSIS — R609 Edema, unspecified: Secondary | ICD-10-CM

## 2022-09-01 ENCOUNTER — Encounter: Payer: Self-pay | Admitting: Bariatrics

## 2022-09-01 NOTE — Progress Notes (Signed)
Chief Complaint:   OBESITY Philip [name] is here to discuss his progress with his obesity treatment plan along with follow-up of his obesity related diagnoses. Philip [name] is on the Category 4 Plan and states he is following his eating plan approximately 80% of the time. Philip [name] states he is doing 0 minutes 0 times per week.  Today's visit was #: 12 Starting weight: 409 lbs Starting date: 11/20/2018 Today's weight: 377 lbs Today's date: 08/19/2022 Total lbs lost to date: 32 Total lbs lost since last in-office visit: 0  Interim History: Philip [name] is up 3 lbs since the holiday. He is eating more carbohydrates.   Subjective:   1. Type 2 diabetes mellitus with hyperglycemia, without long-term current use of insulin ([name]) Philip [name] is taking Ozempic and [place]. He notes Ozempic helps with his appetite. He had been on prednisone, but he has stopped.   2. Dyslipidemia Philip [name] is taking Lipitor.   3. Essential hypertension Philip [name]'s blood pressure at home was 140/80.  4. Vitamin D deficiency Philip [name] is taking prescription Vitamin D.   Assessment/Plan:   1. Type 2 diabetes mellitus with hyperglycemia, without long-term current use of insulin ([name]) Philip [name] will continue Ozempic 2 mg once weekly, and we will refill for 1 month.   - Semaglutide, 2 MG/DOSE, 8 MG/3ML SOPN; Inject 2 mg as directed once a week.  Dispense: 3 mL; Refill: 0  2. Dyslipidemia Cardiovascular risk and specific lipid/LDL goals reviewed.  We discussed several lifestyle modifications today and Philip [name] will continue to work on diet, exercise and weight loss efforts. Orders and follow up as documented in patient record.   3. Essential hypertension Philip [name] will continue taking OTC medication, and will check his blood pressure at home.   4. Vitamin D deficiency Philip [name] will continue prescription Vitamin D 50,000 IU every week, and we will refill for 1 month.   - Vitamin D, Ergocalciferol, (DRISDOL) 1.25 MG (50000 UNIT) CAPS capsule; Take 1 capsule  (50,000 Units total) by mouth every 7 (seven) days.  Dispense: 5 capsule; Refill: 0  5. Obesity, current BMI 52.7 Philip [name] is currently in the action stage of change. As such, his goal is to continue with weight loss efforts. He has agreed to the Category 4 Plan.   Philip [name] will get back on routine. Increase water and protein.   Exercise goals: No exercise has been prescribed at this time.  Behavioral modification strategies: increasing lean protein intake, decreasing simple carbohydrates, increasing vegetables, increasing water intake, decreasing eating out, no skipping meals, meal planning and cooking strategies, keeping healthy foods in the home, and planning for success.  Philip [name] has agreed to follow-up with our clinic in 4 weeks. He was informed of the importance of frequent follow-up visits to maximize his success with intensive lifestyle modifications for his multiple health conditions.   Objective:   Blood pressure (!) 146/80, pulse 68, temperature 98.1 F (36.7 C), height 5\' 11"  (1.803 m), weight (!) 377 lb (171 kg), SpO2 93 %. Body mass index is 52.58 kg/m.  General: Cooperative, alert, well developed, in no acute distress. HEENT: Conjunctivae and lids unremarkable. Cardiovascular: Regular rhythm.  Lungs: Normal work of breathing. Neurologic: No focal deficits.   Lab Results  Component Value Date   CREATININE 1.18 05/31/2022   BUN 24 05/31/2022   NA 137 05/31/2022   K 4.6 05/31/2022   CL 99 05/31/2022   CO2 26 05/31/2022   Lab Results  Component Value Date   ALT 18 05/31/2022   AST 17  05/31/2022   ALKPHOS 78 05/31/2022   BILITOT 0.6 05/31/2022   Lab Results  Component Value Date   HGBA1C 13.2 (H) 03/30/2022   HGBA1C 10.3 11/10/2021   HGBA1C 10.6 09/22/2021   Lab Results  Component Value Date   INSULIN 8.9 05/31/2022   INSULIN 6.0 03/30/2022   Lab Results  Component Value Date   TSH 2.37 11/10/2021   Lab Results  Component Value Date   CHOL 208 (H)  05/31/2022   HDL 35 (L) 05/31/2022   LDLCALC 117 (H) 05/31/2022   TRIG 319 (H) 05/31/2022   Lab Results  Component Value Date   VD25OH 26.5 (L) 05/31/2022   VD25OH 21.8 (L) 03/30/2022   Lab Results  Component Value Date   WBC 7.6 11/10/2021   HGB 12.8 (A) 11/10/2021   HCT 41 11/10/2021   MCV 86.3 05/26/2021   PLT 311 11/10/2021   No results found for: "IRON", "TIBC", "FERRITIN"  Attestation Statements:   Reviewed by clinician on day of visit: allergies, medications, problem list, medical history, surgical history, family history, social history, and previous encounter notes.   Wilhemena Durie, am acting as Location manager for [employer], DO.  I have reviewed the above documentation for accuracy and completeness, and I agree with the above. Jearld Lesch, DO

## 2022-09-21 ENCOUNTER — Encounter: Payer: Self-pay | Admitting: Bariatrics

## 2022-09-21 ENCOUNTER — Ambulatory Visit (INDEPENDENT_AMBULATORY_CARE_PROVIDER_SITE_OTHER): Payer: 59 | Admitting: Bariatrics

## 2022-09-21 VITALS — BP 148/84 | HR 65 | Temp 97.9°F | Ht 71.0 in | Wt 377.0 lb

## 2022-09-21 DIAGNOSIS — Z7984 Long term (current) use of oral hypoglycemic drugs: Secondary | ICD-10-CM

## 2022-09-21 DIAGNOSIS — E669 Obesity, unspecified: Secondary | ICD-10-CM

## 2022-09-21 DIAGNOSIS — Z7985 Long-term (current) use of injectable non-insulin antidiabetic drugs: Secondary | ICD-10-CM

## 2022-09-21 DIAGNOSIS — E559 Vitamin D deficiency, unspecified: Secondary | ICD-10-CM | POA: Diagnosis not present

## 2022-09-21 DIAGNOSIS — E1165 Type 2 diabetes mellitus with hyperglycemia: Secondary | ICD-10-CM | POA: Diagnosis not present

## 2022-09-21 DIAGNOSIS — E785 Hyperlipidemia, unspecified: Secondary | ICD-10-CM | POA: Diagnosis not present

## 2022-09-21 DIAGNOSIS — Z6841 Body Mass Index (BMI) 40.0 and over, adult: Secondary | ICD-10-CM

## 2022-09-21 DIAGNOSIS — I10 Essential (primary) hypertension: Secondary | ICD-10-CM

## 2022-09-21 MED ORDER — VITAMIN D (ERGOCALCIFEROL) 1.25 MG (50000 UNIT) PO CAPS: 50000.0000 [IU] | ORAL_CAPSULE | ORAL | 0 refills | Status: DC

## 2022-09-21 MED ORDER — SEMAGLUTIDE (2 MG/DOSE) 8 MG/3ML ~~LOC~~ SOPN: 2.0000 mg | PEN_INJECTOR | SUBCUTANEOUS | 0 refills | Status: DC

## 2022-09-29 ENCOUNTER — Encounter: Payer: Self-pay | Admitting: Pulmonary Disease

## 2022-09-29 ENCOUNTER — Ambulatory Visit (INDEPENDENT_AMBULATORY_CARE_PROVIDER_SITE_OTHER): Payer: 59 | Admitting: Pulmonary Disease

## 2022-09-29 VITALS — BP 138/86 | HR 63 | Ht 71.0 in | Wt 386.0 lb

## 2022-09-29 DIAGNOSIS — J4540 Moderate persistent asthma, uncomplicated: Secondary | ICD-10-CM | POA: Diagnosis not present

## 2022-09-29 LAB — PULMONARY FUNCTION TEST
DL/VA % pred: 117 %
DL/VA: 5.05 ml/min/mmHg/L
DLCO cor % pred: 67 %
DLCO cor: 19.84 ml/min/mmHg
DLCO unc % pred: 67 %
DLCO unc: 19.84 ml/min/mmHg
FEF 25-75 Post: 1.87 L/sec
FEF 25-75 Pre: 1.4 L/sec
FEF2575-%Change-Post: 33 %
FEF2575-%Pred-Post: 56 %
FEF2575-%Pred-Pre: 42 %
FEV1-%Change-Post: 19 %
FEV1-%Pred-Post: 45 %
FEV1-%Pred-Pre: 38 %
FEV1-Post: 1.77 L
FEV1-Pre: 1.48 L
FEV1FVC-%Change-Post: -4 %
FEV1FVC-%Pred-Pre: 101 %
FEV6-%Change-Post: 24 %
FEV6-%Pred-Post: 48 %
FEV6-%Pred-Pre: 38 %
FEV6-Post: 2.37 L
FEV6-Pre: 1.9 L
FEV6FVC-%Pred-Post: 104 %
FEV6FVC-%Pred-Pre: 104 %
FVC-%Change-Post: 25 %
FVC-%Pred-Post: 46 %
FVC-%Pred-Pre: 37 %
FVC-Post: 2.38 L
FVC-Pre: 1.9 L
Post FEV1/FVC ratio: 74 %
Post FEV6/FVC ratio: 100 %
Pre FEV1/FVC ratio: 78 %
Pre FEV6/FVC Ratio: 100 %
RV % pred: 119 %
RV: 2.61 L
TLC % pred: 70 %
TLC: 5.04 L

## 2022-09-29 MED ORDER — TRELEGY ELLIPTA 100-62.5-25 MCG/ACT IN AEPB: 1.0000 | INHALATION_SPRAY | Freq: Every day | RESPIRATORY_TRACT | 0 refills | Status: DC
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
## 2022-09-29 NOTE — Progress Notes (Signed)
Full PFT performed today.

## 2022-09-29 NOTE — Patient Instructions (Signed)
Full PFT performed today.

## 2022-09-29 NOTE — Patient Instructions (Signed)
Your breathing tests are improved compared to before in 2022 but still have a mild restrictive defect and diffusion defect.  I am proud of your weight loss so far! Keep up the great work.  I have messaged our pharmacy team about which inhalers are best covered by your insurance.  Continue trelegy for now 1 puff daily  Continue CPAP at night  Follow up in 6 months

## 2022-09-30 ENCOUNTER — Other Ambulatory Visit (HOSPITAL_COMMUNITY): Payer: Self-pay

## 2022-09-30 MED ORDER — SPIRIVA RESPIMAT 2.5 MCG/ACT IN AERS: 2.0000 | INHALATION_SPRAY | Freq: Every day | RESPIRATORY_TRACT | 5 refills | Status: DC

## 2022-09-30 MED ORDER — FLUTICASONE-SALMETEROL 250-50 MCG/ACT IN AEPB: 1.0000 | INHALATION_SPRAY | Freq: Two times a day (BID) | RESPIRATORY_TRACT | 11 refills | Status: DC

## 2022-10-01 NOTE — Progress Notes (Signed)
Chief Complaint:   OBESITY Philip [name] is here to discuss his progress with his obesity treatment plan along with follow-up of his obesity related diagnoses. Philip [name] is on the Category 4 Plan and states he is following his eating plan approximately 80% of the time. Philip [name] states he is not currently exercising.  Today's visit was #: 59 Starting weight: 409 lbs Starting date: 11/20/2018 Today's weight: 377 lbs Today's date: 09/21/2022 Total lbs lost to date: 32 Total lbs lost since last in-office visit: 0  Interim History: Philip [name]'s weight remains the same as before. He got a table top black stove for himself.   Subjective:   1. Essential hypertension Philip [name] is taking Atenolol and [place].  2. Dyslipidemia Philip [name] is taking Lipitor.  3. Type 2 diabetes mellitus with hyperglycemia, without long-term current use of insulin ([name]) Fasting blood sugar runs 130-140. Pt is taking [place] and Ozempic.  4. Vitamin D deficiency Philip [name] is taking Ergocalciferol as directed.  Assessment/Plan:   1. Essential hypertension Continue medication as prescribed. Check BP at home periodically.  2. Dyslipidemia Continue current treatment plan with Lipitor.  3. Type 2 diabetes mellitus with hyperglycemia, without long-term current use of insulin (Philip [name]) Continue current treatment plan.  Refill- Semaglutide, 2 MG/DOSE, 8 MG/3ML SOPN; Inject 2 mg as directed once a week.  Dispense: 3 mL; Refill: 0  4. Vitamin D deficiency Low Vitamin D level contributes to fatigue and are associated with obesity, breast, and colon cancer. He agrees to continue to take prescription Vitamin D 50,000 IU every week and will follow-up for routine testing of Vitamin D, at least 2-3 times per year to avoid over-replacement.  Refill- Vitamin D, Ergocalciferol, (DRISDOL) 1.25 MG (50000 UNIT) CAPS capsule; Take 1 capsule (50,000 Units total) by mouth every 7 (seven) days.  Dispense: 5 capsule; Refill: 0  5. Obesity, current BMI  52.6 Carry is currently in the action stage of change. As such, his goal is to continue with weight loss efforts. He has agreed to the Category 4 Plan.   Meal planning Intentional eating  Exercise goals:  As is  Behavioral modification strategies: increasing lean protein intake, decreasing simple carbohydrates, increasing vegetables, increasing water intake, decreasing eating out, no skipping meals, meal planning and cooking strategies, keeping healthy foods in the home, and planning for success.  Philip [name] has agreed to follow-up with our clinic in 4 weeks. He was informed of the importance of frequent follow-up visits to maximize his success with intensive lifestyle modifications for his multiple health conditions.   Objective:   Blood pressure (!) 148/84, pulse 65, temperature 97.9 F (36.6 C), height 5\' 11"  (1.803 m), weight (!) 377 lb (171 kg), SpO2 97 %. Body mass index is 52.58 kg/m.  General: Cooperative, alert, well developed, in no acute distress. HEENT: Conjunctivae and lids unremarkable. Cardiovascular: Regular rhythm.  Lungs: Normal work of breathing. Neurologic: No focal deficits.   Lab Results  Component Value Date   CREATININE 1.18 05/31/2022   BUN 24 05/31/2022   NA 137 05/31/2022   K 4.6 05/31/2022   CL 99 05/31/2022   CO2 26 05/31/2022   Lab Results  Component Value Date   ALT 18 05/31/2022   AST 17 05/31/2022   ALKPHOS 78 05/31/2022   BILITOT 0.6 05/31/2022   Lab Results  Component Value Date   HGBA1C 13.2 (H) 03/30/2022   HGBA1C 10.3 11/10/2021   HGBA1C 10.6 09/22/2021   Lab Results  Component Value Date   INSULIN 8.9 05/31/2022  INSULIN 6.0 03/30/2022   Lab Results  Component Value Date   TSH 2.37 11/10/2021   Lab Results  Component Value Date   CHOL 208 (H) 05/31/2022   HDL 35 (L) 05/31/2022   LDLCALC 117 (H) 05/31/2022   TRIG 319 (H) 05/31/2022   Lab Results  Component Value Date   VD25OH 26.5 (L) 05/31/2022   VD25OH 21.8 (L)  03/30/2022   Lab Results  Component Value Date   WBC 7.6 11/10/2021   HGB 12.8 (A) 11/10/2021   HCT 41 11/10/2021   MCV 86.3 05/26/2021   PLT 311 11/10/2021   Attestation Statements:   Reviewed by clinician on day of visit: allergies, medications, problem list, medical history, surgical history, family history, social history, and previous encounter notes.  I, Kathlene November, BS, CMA, am acting as transcriptionist for [employer], DO.  I have reviewed the above documentation for accuracy and completeness, and I agree with the above. Jearld Lesch, DO

## 2022-10-03 ENCOUNTER — Encounter: Payer: Self-pay | Admitting: Pulmonary Disease

## 2022-10-04 ENCOUNTER — Encounter: Payer: Self-pay | Admitting: Bariatrics

## 2022-10-08 ENCOUNTER — Telehealth: Payer: Self-pay

## 2022-10-08 NOTE — Telephone Encounter (Signed)
-----  Message from Freddi Starr, MD sent at 09/30/2022  5:26 PM EST ----- Regarding: FW: Inhaler options Please let patient know I sent in wixella inhaler 1 puff twice daily and spiriva inhaler 2 puffs daily to replace his trelegy as these were recommended by our pharmacy team based on his insurance coverage.  Thanks, JD  ----- Message ----- From: Jodean Lima, CPhT Sent: 09/30/2022   3:18 PM EST To: Freddi Starr, MD Subject: RE: Inhaler options                            ICS/LABA covered per benefits investigation are: Georgette Shell, Symbicort, Advair HFA (Wixela seems to be the cheapest option)  LAMA covered per benefit investigation are: Brand Spiriva Handihaler and Spiriva Respimat (Handihaler seems to be the cheapest option. ----- Message ----- From: Freddi Starr, MD Sent: 09/29/2022  10:39 AM EST To: Rx Prior Auth Team Subject: Inhaler options                                Which inhalers ICS/LABA and then LAMA inhalers are best covered by his inurance.  Thanks, Wille Glaser

## 2022-10-08 NOTE — Telephone Encounter (Signed)
Called and spoke with patient. He verbalized understanding. He was able to pick up the inhalers yesterday and they were more affordable than Trelegy. Advised him to call us if he had any issues.   Nothing further needed at time of call.

## 2022-10-19 ENCOUNTER — Ambulatory Visit (INDEPENDENT_AMBULATORY_CARE_PROVIDER_SITE_OTHER): Payer: 59 | Admitting: Bariatrics

## 2022-10-19 ENCOUNTER — Encounter: Payer: Self-pay | Admitting: Bariatrics

## 2022-10-19 VITALS — BP 160/78 | HR 70 | Temp 98.0°F | Ht 71.0 in | Wt 378.0 lb

## 2022-10-19 DIAGNOSIS — E1165 Type 2 diabetes mellitus with hyperglycemia: Secondary | ICD-10-CM | POA: Diagnosis not present

## 2022-10-19 DIAGNOSIS — E559 Vitamin D deficiency, unspecified: Secondary | ICD-10-CM

## 2022-10-19 DIAGNOSIS — Z6841 Body Mass Index (BMI) 40.0 and over, adult: Secondary | ICD-10-CM

## 2022-10-19 DIAGNOSIS — Z7984 Long term (current) use of oral hypoglycemic drugs: Secondary | ICD-10-CM

## 2022-10-19 DIAGNOSIS — Z7985 Long-term (current) use of injectable non-insulin antidiabetic drugs: Secondary | ICD-10-CM

## 2022-10-19 DIAGNOSIS — I10 Essential (primary) hypertension: Secondary | ICD-10-CM | POA: Diagnosis not present

## 2022-10-19 MED ORDER — VITAMIN D (ERGOCALCIFEROL) 1.25 MG (50000 UNIT) PO CAPS: 50000.0000 [IU] | ORAL_CAPSULE | ORAL | 0 refills | Status: DC

## 2022-10-19 MED ORDER — SEMAGLUTIDE (2 MG/DOSE) 8 MG/3ML ~~LOC~~ SOPN: 2.0000 mg | PEN_INJECTOR | SUBCUTANEOUS | 0 refills | Status: DC

## 2022-10-31 NOTE — Progress Notes (Unsigned)
Chief Complaint:   OBESITY Philip [name] is here to discuss his progress with his obesity treatment plan along with [name] of his obesity related diagnoses. Philip [name] is on the Category 4 Plan and states he is following his eating plan approximately 80% of the time. Philip [name] states he is walking 30 minutes 7 times per week.  Today's visit was #: 14 Starting weight: 409 lbs Starting date: 11/20/2018 Today's weight: 378 lbs Today's date: 10/19/2022 Total lbs lost to date: 31 Total lbs lost since last in-office visit: +1  Interim History: Philip [name] is up 1 lb since his last visit. He is doing well with his protein.  Subjective:   1. Vitamin D deficiency Philip [name] is taking high dose Vitamin D weekly.  2. Type 2 diabetes mellitus with hyperglycemia, without long-term current use of insulin ([name]) Philip [name] is taking [place], Actos, and Semaglutide.  3. Essential hypertension BP elevated today- 173/81. Pt states that he took BP meds- atenolol and Hyzaar.  Assessment/Plan:   1. Vitamin D deficiency Continue current treatment plan.  Refill- Vitamin D, Ergocalciferol, (DRISDOL) 1.25 MG (50000 UNIT) CAPS capsule; Take 1 capsule (50,000 Units total) by mouth every 7 (seven) days.  Dispense: 5 capsule; Refill: 0  2. Type 2 diabetes mellitus with hyperglycemia, without long-term current use of insulin ([name]) Continue Actos, Farxiga, and Semaglutide.  Refill- Semaglutide, 2 MG/DOSE, 8 MG/3ML SOPN; Inject 2 mg as directed once a week.  Dispense: 3 mL; Refill: 0  3. Essential hypertension Continue medications. Check BP at home and bring in log of readings to next visit.  4. Morbid obesity ([place]) 5. BMI 50.0-59.9, adult ([name]) Will drink 64 oz of water and decrease kool-aid. Will continue to cook.  Philip [name] is currently in the action stage of change. As such, his goal is to continue with weight loss efforts. He has agreed to the Category 4 Plan.   Exercise goals:  As is  Behavioral modification strategies:  increasing lean protein intake, decreasing simple carbohydrates, increasing vegetables, increasing water intake, decreasing eating out, no skipping meals, meal planning and cooking strategies, keeping healthy foods in the home, and planning for success.  Philip [name] with our clinic in 4 weeks. He was informed of the importance of frequent [name] visits to maximize his success with intensive lifestyle modifications for his multiple health conditions.   Objective:   Blood pressure (!) 160/78, pulse 70, temperature 98 F (36.7 C), height 5' 11"$  (1.803 m), weight (!) 378 lb (171.5 kg), SpO2 98 %. Body mass index is 52.72 kg/m.  General: Cooperative, alert, well developed, in no acute distress. HEENT: Conjunctivae and lids unremarkable. Cardiovascular: Regular rhythm.  Lungs: Normal work of breathing. Neurologic: No focal deficits.   Lab Results  Component Value Date   CREATININE 1.18 05/31/2022   BUN 24 05/31/2022   NA 137 05/31/2022   K 4.6 05/31/2022   CL 99 05/31/2022   CO2 26 05/31/2022   Lab Results  Component Value Date   ALT 18 05/31/2022   AST 17 05/31/2022   ALKPHOS 78 05/31/2022   BILITOT 0.6 05/31/2022   Lab Results  Component Value Date   HGBA1C 13.2 (H) 03/30/2022   HGBA1C 10.3 11/10/2021   HGBA1C 10.6 09/22/2021   Lab Results  Component Value Date   INSULIN 8.9 05/31/2022   INSULIN 6.0 03/30/2022   Lab Results  Component Value Date   TSH 2.37 11/10/2021   Lab Results  Component Value Date   CHOL 208 (  H) 05/31/2022   HDL 35 (L) 05/31/2022   LDLCALC 117 (H) 05/31/2022   TRIG 319 (H) 05/31/2022   Lab Results  Component Value Date   VD25OH 26.5 (L) 05/31/2022   VD25OH 21.8 (L) 03/30/2022   Lab Results  Component Value Date   WBC 7.6 11/10/2021   HGB 12.8 (A) 11/10/2021   HCT 41 11/10/2021   MCV 86.3 05/26/2021   PLT 311 11/10/2021    Attestation Statements:   Reviewed by clinician on day of visit: allergies,  medications, problem list, medical history, surgical history, family history, social history, and previous encounter notes.  I, Kathlene November, BS, CMA, am acting as transcriptionist for [employer], DO.  I have reviewed the above documentation for accuracy and completeness, and I agree with the above. Jearld Lesch, DO

## 2022-11-01 ENCOUNTER — Other Ambulatory Visit: Payer: Self-pay | Admitting: Pulmonary Disease

## 2022-11-01 DIAGNOSIS — R609 Edema, unspecified: Secondary | ICD-10-CM

## 2022-11-01 NOTE — Telephone Encounter (Signed)
Received a refill request for furosemide 30m. Last RX was sent on 08/31/22 for 2 refills. He was last seen on 10/19/22  but it wasn't mentioned in his office note.   Dr. DErin Fulling please advise if you are ok with this refill. Thanks!

## 2022-11-02 ENCOUNTER — Encounter: Payer: Self-pay | Admitting: Bariatrics

## 2022-11-16 ENCOUNTER — Ambulatory Visit (INDEPENDENT_AMBULATORY_CARE_PROVIDER_SITE_OTHER): Payer: 59 | Admitting: Bariatrics

## 2022-11-16 ENCOUNTER — Encounter: Payer: Self-pay | Admitting: Bariatrics

## 2022-11-16 VITALS — BP 138/84 | HR 79 | Temp 98.0°F | Ht 71.0 in | Wt 380.0 lb

## 2022-11-16 DIAGNOSIS — E559 Vitamin D deficiency, unspecified: Secondary | ICD-10-CM | POA: Diagnosis not present

## 2022-11-16 DIAGNOSIS — Z7985 Long-term (current) use of injectable non-insulin antidiabetic drugs: Secondary | ICD-10-CM

## 2022-11-16 DIAGNOSIS — E1165 Type 2 diabetes mellitus with hyperglycemia: Secondary | ICD-10-CM | POA: Diagnosis not present

## 2022-11-16 DIAGNOSIS — I10 Essential (primary) hypertension: Secondary | ICD-10-CM

## 2022-11-16 DIAGNOSIS — Z6841 Body Mass Index (BMI) 40.0 and over, adult: Secondary | ICD-10-CM

## 2022-11-16 MED ORDER — VITAMIN D (ERGOCALCIFEROL) 1.25 MG (50000 UNIT) PO CAPS: 50000.0000 [IU] | ORAL_CAPSULE | ORAL | 0 refills | Status: DC

## 2022-11-16 MED ORDER — SEMAGLUTIDE (2 MG/DOSE) 8 MG/3ML ~~LOC~~ SOPN: 2.0000 mg | PEN_INJECTOR | SUBCUTANEOUS | 0 refills | Status: DC

## 2022-11-17 LAB — COMPREHENSIVE METABOLIC PANEL
ALT: 20 IU/L (ref 0–44)
AST: 17 IU/L (ref 0–40)
Albumin/Globulin Ratio: 1.2 (ref 1.2–2.2)
Albumin: 3.8 g/dL (ref 3.8–4.9)
Alkaline Phosphatase: 82 IU/L (ref 44–121)
BUN/Creatinine Ratio: 22 — ABNORMAL HIGH (ref 9–20)
BUN: 29 mg/dL — ABNORMAL HIGH (ref 6–24)
Bilirubin Total: 0.5 mg/dL (ref 0.0–1.2)
CO2: 24 mmol/L (ref 20–29)
Calcium: 9.5 mg/dL (ref 8.7–10.2)
Chloride: 97 mmol/L (ref 96–106)
Creatinine, Ser: 1.32 mg/dL — ABNORMAL HIGH (ref 0.76–1.27)
Globulin, Total: 3.1 g/dL (ref 1.5–4.5)
Glucose: 276 mg/dL — ABNORMAL HIGH (ref 70–99)
Potassium: 5.1 mmol/L (ref 3.5–5.2)
Sodium: 136 mmol/L (ref 134–144)
Total Protein: 6.9 g/dL (ref 6.0–8.5)
eGFR: 64 mL/min/{1.73_m2} (ref >59)

## 2022-11-17 LAB — VITAMIN D 25 HYDROXY (VIT D DEFICIENCY, FRACTURES): Vit D, 25-Hydroxy: 34.5 ng/mL (ref 30.0–100.0)

## 2022-11-17 LAB — LIPID PANEL WITH LDL/HDL RATIO
Cholesterol, Total: 252 mg/dL — ABNORMAL HIGH (ref 100–199)
HDL: 39 mg/dL — ABNORMAL LOW (ref >39)
LDL Chol Calc (NIH): 152 mg/dL — ABNORMAL HIGH (ref 0–99)
LDL/HDL Ratio: 3.9 ratio — ABNORMAL HIGH (ref 0.0–3.6)
Triglycerides: 328 mg/dL — ABNORMAL HIGH (ref 0–149)
VLDL Cholesterol Cal: 61 mg/dL — ABNORMAL HIGH (ref 5–40)

## 2022-11-17 LAB — INSULIN, RANDOM: INSULIN: 11.4 u[IU]/mL (ref 2.6–24.9)

## 2022-11-17 LAB — HEMOGLOBIN A1C
Est. average glucose Bld gHb Est-mCnc: 249 mg/dL
Hgb A1c MFr Bld: 10.3 % — ABNORMAL HIGH (ref 4.8–5.6)

## 2022-11-18 ENCOUNTER — Other Ambulatory Visit (INDEPENDENT_AMBULATORY_CARE_PROVIDER_SITE_OTHER): Payer: Self-pay | Admitting: Bariatrics

## 2022-11-18 MED ORDER — FENOFIBRATE 145 MG PO TABS: 145.0000 mg | ORAL_TABLET | Freq: Every day | ORAL | 2 refills | Status: DC

## 2022-11-24 NOTE — Progress Notes (Unsigned)
Chief Complaint:   OBESITY Philip [name] is here to discuss his progress with his obesity treatment plan along with follow-up of his obesity related diagnoses. Philip [name] is on the Category 4 plan and states he is following his eating plan approximately 80% of the time. Philip [name] states he is walking for 30 minutes 7 times per week.  Today's visit was #: 15 Starting weight: 409 lbs Starting date: 11/20/2018 Today's weight: 380 lbs Today's date: 11/16/22 Total lbs lost to date: 29 Total lbs lost since last in-office visit: +2  Interim History: He is up 2 pounds since his last visit.  He is up 2 pounds of water per the bioimpedance scale.  Subjective:   1. Vitamin D deficiency Minimal sun exposure.  2. Type 2 diabetes mellitus with hyperglycemia, without long-term current use of insulin ([name]) Decreased appetite.  Taking Ozempic as directed.  3. Essential hypertension Taking atenolol, Lasix, Hyzaar.  Blood pressure always elevated after exertion.  Decreased to 138/85 with a large cuff.   Assessment/Plan:   1. Vitamin D deficiency Refill- - Vitamin D, Ergocalciferol, (DRISDOL) 1.25 MG (50000 UNIT) CAPS capsule; Take 1 capsule (50,000 Units total) by mouth every 7 (seven) days.  Dispense: 5 capsule; Refill: 0 - VITAMIN D 25 Hydroxy (Vit-D Deficiency, Fractures) 2.  Check vitamin D.  2. Type 2 diabetes mellitus with hyperglycemia, without long-term current use of insulin ([place]) 1.  Refill- - Semaglutide, 2 MG/DOSE, 8 MG/3ML SOPN; Inject 2 mg as directed once a week.  Dispense: 3 mL; Refill: 0 2.  Check labs- - Hemoglobin A1c - Insulin, random  3. Essential hypertension 1.  Will use the larger cuff. 2.  No added salt. 3.  Check labs- - Lipid Panel With LDL/HDL Ratio - Comprehensive metabolic panel  4. Morbid obesity ([place]) BMI 50.0-59.9, adult ([place]) 1.  Meal planning. 2.  Intentional eating.  Philip [name] is currently in the action stage of change. As such, his goal is to continue with  weight loss efforts. He has agreed to the Category 4 plan.  Exercise goals: Continue walking.  Behavioral modification strategies: increasing lean protein intake, decreasing simple carbohydrates, increasing vegetables, increasing water intake, decreasing eating out, no skipping meals, meal planning and cooking strategies, keeping healthy foods in the home, and planning for success.  Philip [name] has agreed to follow-up with our clinic in 4 weeks. He was informed of the importance of frequent follow-up visits to maximize his success with intensive lifestyle modifications for his multiple health conditions.   Philip [name] was informed we would discuss his lab results at his next visit unless there is a critical issue that needs to be addressed sooner. Philip [name] agreed to keep his next visit at the agreed upon time to discuss these results.  Objective:   Blood pressure 138/84, pulse 79, temperature 98 F (36.7 C), height '5\' 11"'$  (1.803 m), weight (!) 380 lb (172.4 kg), SpO2 99 %. Body mass index is 53 kg/m.  General: Cooperative, alert, well developed, in no acute distress. HEENT: Conjunctivae and lids unremarkable. Cardiovascular: Regular rhythm.  Lungs: Normal work of breathing. Neurologic: No focal deficits.   Lab Results  Component Value Date   CREATININE 1.32 (H) 11/16/2022   BUN 29 (H) 11/16/2022   NA 136 11/16/2022   K 5.1 11/16/2022   CL 97 11/16/2022   CO2 24 11/16/2022   Lab Results  Component Value Date   ALT 20 11/16/2022   AST 17 11/16/2022   ALKPHOS 82 11/16/2022   BILITOT  0.5 11/16/2022   Lab Results  Component Value Date   HGBA1C 10.3 (H) 11/16/2022   HGBA1C 13.2 (H) 03/30/2022   HGBA1C 10.3 11/10/2021   HGBA1C 10.6 09/22/2021   Lab Results  Component Value Date   INSULIN 11.4 11/16/2022   INSULIN 8.9 05/31/2022   INSULIN 6.0 03/30/2022   Lab Results  Component Value Date   TSH 2.37 11/10/2021   Lab Results  Component Value Date   CHOL 252 (H) 11/16/2022   HDL 39  (L) 11/16/2022   LDLCALC 152 (H) 11/16/2022   TRIG 328 (H) 11/16/2022   Lab Results  Component Value Date   VD25OH 34.5 11/16/2022   VD25OH 26.5 (L) 05/31/2022   VD25OH 21.8 (L) 03/30/2022   Lab Results  Component Value Date   WBC 7.6 11/10/2021   HGB 12.8 (A) 11/10/2021   HCT 41 11/10/2021   MCV 86.3 05/26/2021   PLT 311 11/10/2021   No results found for: "IRON", "TIBC", "FERRITIN"  Attestation Statements:   Reviewed by clinician on day of visit: allergies, medications, problem list, medical history, surgical history, family history, social history, and previous encounter notes.  I, Dawn Whitmire, FNP-C, am acting as transcriptionist for Dr. Jearld Lesch.  I have reviewed the above documentation for accuracy and completeness, and I agree with the above. Jearld Lesch, DO

## 2022-11-25 ENCOUNTER — Encounter: Payer: Self-pay | Admitting: Bariatrics

## 2022-11-28 ENCOUNTER — Other Ambulatory Visit: Payer: Self-pay | Admitting: Pulmonary Disease

## 2022-11-28 DIAGNOSIS — R609 Edema, unspecified: Secondary | ICD-10-CM

## 2022-12-14 ENCOUNTER — Ambulatory Visit (INDEPENDENT_AMBULATORY_CARE_PROVIDER_SITE_OTHER): Payer: 59 | Admitting: Bariatrics

## 2022-12-14 ENCOUNTER — Encounter: Payer: Self-pay | Admitting: Bariatrics

## 2022-12-14 VITALS — BP 153/73 | HR 55 | Temp 97.8°F | Ht 71.0 in | Wt 378.0 lb

## 2022-12-14 DIAGNOSIS — I152 Hypertension secondary to endocrine disorders: Secondary | ICD-10-CM | POA: Diagnosis not present

## 2022-12-14 DIAGNOSIS — E1165 Type 2 diabetes mellitus with hyperglycemia: Secondary | ICD-10-CM | POA: Diagnosis not present

## 2022-12-14 DIAGNOSIS — Z6841 Body Mass Index (BMI) 40.0 and over, adult: Secondary | ICD-10-CM

## 2022-12-14 DIAGNOSIS — Z7985 Long-term (current) use of injectable non-insulin antidiabetic drugs: Secondary | ICD-10-CM

## 2022-12-14 DIAGNOSIS — E1159 Type 2 diabetes mellitus with other circulatory complications: Secondary | ICD-10-CM | POA: Diagnosis not present

## 2022-12-14 NOTE — Progress Notes (Signed)
WEIGHT SUMMARY AND BIOMETRICS  Weight Lost Since Last Visit: 2lb    Vitals Temp: 97.8 F (36.6 C) BP: (!) 153/73 Pulse Rate: (!) 55 SpO2: 95 %   Anthropometric Measurements Height: 5\' 11"  (1.803 m) Weight: (!) 378 lb (171.5 kg) BMI (Calculated): 52.74 Weight at Last Visit: 380lb Weight Lost Since Last Visit: 2lb Weight Gained Since Last Visit: 0 Starting Weight: 409lb Total Weight Loss (lbs): 31 lb (14.1 kg)   Body Composition  Body Fat %: 47.4 % Fat Mass (lbs): 179.2 lbs Muscle Mass (lbs): 189.2 lbs Total Body Water (lbs): 153 lbs Visceral Fat Rating : 36   Other Clinical Data Fasting: no Labs: no Today's Visit #: 59 Starting Date: 11/20/18    OBESITY Avigdor is here to discuss his progress with his obesity treatment plan along with follow-up of his obesity related diagnoses.     Nutrition Plan: the Category 4 plan - 80% adherence.  Current exercise: walking  Interim History:  He is down 2 lbs since his last visit.  Eating all of the food on the plan., Protein intake is as prescribed, Is drinking sugar sweetened beverages, and Is not skipping meals  Pharmacotherapy: [place] is on Ozempic 2 mg SQ weekly Adverse side effects: None Hunger is moderately controlled.  Cravings are moderately controlled.  Assessment/Plan:   Type II Diabetes HgbA1c is not at goal. Last A1c was 10.3, but earlier was over 13.    Episodes of hypoglycemia: no Medication(s): Ozempic 2 mg SQ weekly  Lab Results  Component Value Date   HGBA1C 10.3 (H) 11/16/2022   HGBA1C 13.2 (H) 03/30/2022   HGBA1C 10.3 11/10/2021   Lab Results  Component Value Date   LDLCALC 152 (H) 11/16/2022   CREATININE 1.32 (H) 11/16/2022   Lab Results  Component Value Date   GFR 85.57 12/29/2020   GFR 85.61 12/03/2020    Plan: Continue Ozempic 2 mg SQ weekly Continue all other medications.  Will keep all carbohydrates low both sweets and starches.  Will continue exercise  regimen to 30 to 60 minutes on most days of the week.  Aim for 7 to 9 hours of sleep nightly.  Eat more low glycemic index foods.   Hypertension Hypertension improved.  Medication(s): Hyzaar and Atenolol  BP Readings from Last 3 Encounters:  12/14/22 (!) 153/73  11/16/22 138/84  10/19/22 (!) 160/78   Lab Results  Component Value Date   CREATININE 1.32 (H) 11/16/2022   CREATININE 1.18 05/31/2022   CREATININE 1.24 03/30/2022   Lab Results  Component Value Date   GFR 85.57 12/29/2020   GFR 85.61 12/03/2020    Plan: Continue all antihypertensives at current dosages. No added salt.  Keep salt intake to 1,500 mg or below.   Morbid Obesity: Current BMI BMI (Calculated): 52.74   Pharmacotherapy Plan Continue  Ozempic 2 mg SQ weekly  Arnav is currently in the action stage of change. As such, his goal is to continue with weight loss efforts.  He has agreed to the Category 4 plan and keeping a food journal with goal of 1,800 calories and 120 grams of protein daily. He will start to use Lose It application on his phones.   Exercise goals: For substantial health benefits, adults should do at least 150 minutes (2 hours and 30 minutes) a week of moderate-intensity, or 75 minutes (1 hour and 15 minutes) a week of vigorous-intensity aerobic physical activity, or an equivalent combination of moderate- and vigorous-intensity aerobic activity. Aerobic activity should  be performed in episodes of at least 10 minutes, and preferably, it should be spread throughout the week.  Behavioral modification strategies: increasing lean protein intake, no meal skipping, decrease eating out, decrease liquid calories, and better snacking choices.  [place] has agreed to follow-up with our clinic in 4 weeks.       Objective:   VITALS: Per patient if applicable, see vitals. GENERAL: Alert and in no acute distress. CARDIOPULMONARY: No increased WOB. Speaking in clear sentences.  PSYCH: Pleasant and  cooperative. Speech normal rate and rhythm. Affect is appropriate. Insight and judgement are appropriate. Attention is focused, linear, and appropriate.  NEURO: Oriented as arrived to appointment on time with no prompting.   Attestation Statements:    This was prepared with the assistance of Presenter, broadcasting.  Occasional wrong-word or sound-a-like substitutions may have occurred due to the inherent limitations of voice recognition software.   Jearld Lesch, DO

## 2022-12-20 ENCOUNTER — Other Ambulatory Visit (INDEPENDENT_AMBULATORY_CARE_PROVIDER_SITE_OTHER): Payer: Self-pay | Admitting: Bariatrics

## 2022-12-20 ENCOUNTER — Other Ambulatory Visit: Payer: Self-pay | Admitting: Bariatrics

## 2022-12-20 DIAGNOSIS — E559 Vitamin D deficiency, unspecified: Secondary | ICD-10-CM

## 2022-12-20 MED ORDER — VITAMIN D (ERGOCALCIFEROL) 1.25 MG (50000 UNIT) PO CAPS: 50000.0000 [IU] | ORAL_CAPSULE | ORAL | 0 refills | Status: DC

## 2022-12-20 NOTE — Telephone Encounter (Signed)
Rx sent 

## 2023-01-13 ENCOUNTER — Encounter: Payer: Self-pay | Admitting: Bariatrics

## 2023-01-13 ENCOUNTER — Ambulatory Visit (INDEPENDENT_AMBULATORY_CARE_PROVIDER_SITE_OTHER): Payer: 59 | Admitting: Bariatrics

## 2023-01-13 VITALS — BP 139/84 | HR 66 | Temp 97.9°F | Ht 71.0 in | Wt 375.0 lb

## 2023-01-13 DIAGNOSIS — E781 Pure hyperglyceridemia: Secondary | ICD-10-CM | POA: Diagnosis not present

## 2023-01-13 DIAGNOSIS — Z6841 Body Mass Index (BMI) 40.0 and over, adult: Secondary | ICD-10-CM

## 2023-01-13 DIAGNOSIS — E559 Vitamin D deficiency, unspecified: Secondary | ICD-10-CM | POA: Diagnosis not present

## 2023-01-13 DIAGNOSIS — E1165 Type 2 diabetes mellitus with hyperglycemia: Secondary | ICD-10-CM | POA: Diagnosis not present

## 2023-01-13 DIAGNOSIS — Z7985 Long-term (current) use of injectable non-insulin antidiabetic drugs: Secondary | ICD-10-CM

## 2023-01-13 MED ORDER — SEMAGLUTIDE (2 MG/DOSE) 8 MG/3ML ~~LOC~~ SOPN: 2.0000 mg | PEN_INJECTOR | SUBCUTANEOUS | 0 refills | Status: DC

## 2023-01-13 MED ORDER — FENOFIBRATE 145 MG PO TABS: 145.0000 mg | ORAL_TABLET | Freq: Every day | ORAL | 2 refills | Status: DC

## 2023-01-13 MED ORDER — VITAMIN D (ERGOCALCIFEROL) 1.25 MG (50000 UNIT) PO CAPS: 50000.0000 [IU] | ORAL_CAPSULE | ORAL | 0 refills | Status: DC

## 2023-01-18 ENCOUNTER — Encounter: Payer: Self-pay | Admitting: Bariatrics

## 2023-01-18 NOTE — Progress Notes (Signed)
Chief Complaint:   OBESITY Philip [name] is here to discuss his progress with his obesity treatment plan along with follow-up of his obesity related diagnoses. Philip [name] is on the Category 4 Plan and states he is following his eating plan approximately 80% of the time. Philip [name] states he is walking for 15-20 minutes 7 times per week.  Today's visit was #: 17 Starting weight: 409 lbs Starting date: 11/20/2018 Today's weight: 375 lbs Today's date: 01/13/2023 Total lbs lost to date: 34 Total lbs lost since last in-office visit: 3  Interim History: Philip [name] is down another 3 lbs since his last visit. His water intake is good.   Subjective:   1. Vitamin D deficiency Philip [name] is taking vitamin D.  2. Type 2 diabetes mellitus with hyperglycemia, without long-term current use of insulin ([name]) Philip [name] is taking his medications as directed.  He is checking his blood sugars, and they range between 230-250s.  3. Hypertriglyceridemia Philip [name] is taking his medications as directed.  Assessment/Plan:   1. Vitamin D deficiency Philip [name] will continue prescription vitamin D, and we will refill for 1 month.  - Vitamin D, Ergocalciferol, (DRISDOL) 1.25 MG (50000 UNIT) CAPS capsule; Take 1 capsule (50,000 Units total) by mouth once a week.  Dispense: 5 capsule; Refill: 0  2. Type 2 diabetes mellitus with hyperglycemia, without long-term current use of insulin ([name]) Philip [name] will continue semaglutide 2 mg, and we will refill for 1 month.  He will work on decreasing carbohydrates.  - Semaglutide, 2 MG/DOSE, 8 MG/3ML SOPN; Inject 2 mg as directed once a week.  Dispense: 3 mL; Refill: 0  3. Hypertriglyceridemia Philip [name] will continue Tricor, and we will refill for 90 days.  - fenofibrate (TRICOR) 145 MG tablet; Take 1 tablet (145 mg total) by mouth daily.  Dispense: 30 tablet; Refill: 2  4. Morbid obesity ([name])  5. BMI 50.0-59.9, adult Philip [name] is currently in the action stage of change. As such, his goal is to continue  with weight loss efforts. He has agreed to the Category 4 Plan.   Meal planning and intentional eating were discussed. We will recheck fasting labs at his next visit.   Exercise goals: As is.   Behavioral modification strategies: increasing lean protein intake, decreasing simple carbohydrates, increasing vegetables, increasing water intake, decreasing eating out, no skipping meals, meal planning and cooking strategies, keeping healthy foods in the home, and planning for success.  Philip [name] has agreed to follow-up with our clinic in 4 weeks. He was informed of the importance of frequent follow-up visits to maximize his success with intensive lifestyle modifications for his multiple health conditions.   Objective:   Blood pressure 139/84, pulse 66, temperature 97.9 F (36.6 C), height 5\' 11"  (1.803 m), weight (!) 375 lb (170.1 kg), SpO2 99 %. Body mass index is 52.3 kg/m.  General: Cooperative, alert, well developed, in no acute distress. HEENT: Conjunctivae and lids unremarkable. Cardiovascular: Regular rhythm.  Lungs: Normal work of breathing. Neurologic: No focal deficits.   Lab Results  Component Value Date   CREATININE 1.32 (H) 11/16/2022   BUN 29 (H) 11/16/2022   NA 136 11/16/2022   K 5.1 11/16/2022   CL 97 11/16/2022   CO2 24 11/16/2022   Lab Results  Component Value Date   ALT 20 11/16/2022   AST 17 11/16/2022   ALKPHOS 82 11/16/2022   BILITOT 0.5 11/16/2022   Lab Results  Component Value Date   HGBA1C 10.3 (H) 11/16/2022   HGBA1C 13.2 (H)  03/30/2022   HGBA1C 10.3 11/10/2021   HGBA1C 10.6 09/22/2021   Lab Results  Component Value Date   INSULIN 11.4 11/16/2022   INSULIN 8.9 05/31/2022   INSULIN 6.0 03/30/2022   Lab Results  Component Value Date   TSH 2.37 11/10/2021   Lab Results  Component Value Date   CHOL 252 (H) 11/16/2022   HDL 39 (L) 11/16/2022   LDLCALC 152 (H) 11/16/2022   TRIG 328 (H) 11/16/2022   Lab Results  Component Value Date   VD25OH  34.5 11/16/2022   VD25OH 26.5 (L) 05/31/2022   VD25OH 21.8 (L) 03/30/2022   Lab Results  Component Value Date   WBC 7.6 11/10/2021   HGB 12.8 (A) 11/10/2021   HCT 41 11/10/2021   MCV 86.3 05/26/2021   PLT 311 11/10/2021   No results found for: "IRON", "TIBC", "FERRITIN"  Attestation Statements:   Reviewed by clinician on day of visit: allergies, medications, problem list, medical history, surgical history, family history, social history, and previous encounter notes.   Trude Mcburney, am acting as Energy manager for [employer], DO.  I have reviewed the above documentation for accuracy and completeness, and I agree with the above. Corinna Capra, DO

## 2023-02-23 ENCOUNTER — Encounter: Payer: Self-pay | Admitting: Bariatrics

## 2023-02-23 ENCOUNTER — Ambulatory Visit (INDEPENDENT_AMBULATORY_CARE_PROVIDER_SITE_OTHER): Payer: 59 | Admitting: Bariatrics

## 2023-02-23 VITALS — BP 145/75 | HR 77 | Temp 97.9°F | Ht 71.0 in | Wt 377.0 lb

## 2023-02-23 DIAGNOSIS — Z7985 Long-term (current) use of injectable non-insulin antidiabetic drugs: Secondary | ICD-10-CM

## 2023-02-23 DIAGNOSIS — E781 Pure hyperglyceridemia: Secondary | ICD-10-CM

## 2023-02-23 DIAGNOSIS — E1165 Type 2 diabetes mellitus with hyperglycemia: Secondary | ICD-10-CM | POA: Diagnosis not present

## 2023-02-23 DIAGNOSIS — Z6841 Body Mass Index (BMI) 40.0 and over, adult: Secondary | ICD-10-CM

## 2023-02-23 DIAGNOSIS — E559 Vitamin D deficiency, unspecified: Secondary | ICD-10-CM | POA: Diagnosis not present

## 2023-02-23 MED ORDER — VITAMIN D (ERGOCALCIFEROL) 1.25 MG (50000 UNIT) PO CAPS: 50000.0000 [IU] | ORAL_CAPSULE | ORAL | 0 refills | Status: DC

## 2023-02-23 MED ORDER — SEMAGLUTIDE (2 MG/DOSE) 8 MG/3ML ~~LOC~~ SOPN: 2.0000 mg | PEN_INJECTOR | SUBCUTANEOUS | 0 refills | Status: DC

## 2023-02-23 NOTE — Progress Notes (Addendum)
WEIGHT SUMMARY AND BIOMETRICS   Weight Gained Since Last Visit: 2lb   Vitals Temp: 97.9 F (36.6 C) BP: (!) 174/84 Pulse Rate: 77 SpO2: 100 %   Anthropometric Measurements Height: 5\' 11"  (1.803 m) Weight: (!) 377 lb (171 kg) BMI (Calculated): 52.6 Weight at Last Visit: 377lb Weight Gained Since Last Visit: 2lb Starting Weight: 409lb Total Weight Loss (lbs): 32 lb (14.5 kg)   Body Composition  Body Fat %: 47.5 % Fat Mass (lbs): 179.2 lbs Muscle Mass (lbs): 188.4 lbs Total Body Water (lbs): 155 lbs Visceral Fat Rating : 36   Other Clinical Data Fasting: yes Labs: yes Today's Visit #: 18 Starting Date: 11/20/18    OBESITY Philip [name] is here to discuss his progress with his obesity treatment plan along with follow-up of his obesity related diagnoses.     Nutrition Plan: the Category 4 plan - 80% adherence.  Current exercise: none  Interim History:  He is up to 2 lbs. He needs labs today.  Eating all of the food on the plan., Protein intake is as prescribed, and Water intake is adequate.  Pharmacotherapy: Philip [name] is on Philip [name] 2 mg SQ weekly Adverse side effects: None Hunger is moderately controlled.  Cravings are moderately controlled.  Assessment/Plan:   1. Vitamin D deficiency Vitamin D Deficiency Vitamin D is not at goal of 50.  Most recent vitamin D level was 34.5. He is on  prescription ergocalciferol 50,000 IU weekly. Lab Results  Component Value Date   VD25OH 34.5 11/16/2022   VD25OH 26.5 (L) 05/31/2022   VD25OH 21.8 (L) 03/30/2022    Plan: Refill prescription vitamin D 50,000 IU weekly.   2. Type 2 diabetes mellitus with hyperglycemia, without long-term current use of insulin ([name]) Type II Diabetes HgbA1c is not at goal. Last A1c was 10.3 CBGs: Fasting 130 to 150       Episodes of hypoglycemia: no Medication(s): Philip [name] 2 mg SQ weekly  Lab Results  Component Value Date   HGBA1C 10.3 (H) 11/16/2022   HGBA1C 13.2 (H)  03/30/2022   HGBA1C 10.3 11/10/2021   Lab Results  Component Value Date   LDLCALC 152 (H) 11/16/2022   CREATININE 1.32 (H) 11/16/2022   Lab Results  Component Value Date   GFR 85.57 12/29/2020   GFR 85.61 12/03/2020    Plan: Continue and refill Philip [name] 2 mg SQ weekly Continue all other medications.  Will keep all carbohydrates low both sweets and starches.  Will continue exercise regimen to 30 to 60 minutes on most days of the week.  Aim for 7 to 9 hours of sleep nightly.  Eat more low glycemic index foods.   3. Hypertriglyceridemia  Triglycerides are elevated as per his last labs.  LDL is not at goal. Medication(s): Philip [name] and Philip [name].  Cardiovascular risk factors: advanced age (older than 67 for men, 23 for women), diabetes mellitus, dyslipidemia, hypertension, male gender, obesity (BMI >= 30 kg/m2), and sedentary lifestyle  Lab Results  Component Value Date   CHOL 252 (H) 11/16/2022   HDL 39 (L) 11/16/2022   LDLCALC 152 (H) 11/16/2022   TRIG 328 (H) 11/16/2022   Lab Results  Component Value Date   ALT 20 11/16/2022   AST 17 11/16/2022   ALKPHOS 82 11/16/2022   BILITOT 0.5 11/16/2022   The 10-year ASCVD risk score (Arnett DK, et al., 2019) is: 38.3%   Values used to calculate the score:     Age: 57 years     Sex: Male  Is Non-Hispanic African American: Yes     Diabetic: Yes     Tobacco smoker: No     Systolic Blood Pressure: 174 mmHg     Is BP treated: Yes     HDL Cholesterol: 39 mg/dL     Total Cholesterol: 252 mg/dL  Plan:  Continue statin.  Will avoid all trans fats.  Will read labels Will minimize saturated fats except the following: low fat meats in moderation, diary, and limited dark chocolate.  Increase Omega 3 in foods, and consider an Omega 3 supplement.    Labs done today (CMP, Lipids, HgbA1c, insulin, vitamin D, and microalbumin).    Morbid Obesity: Current BMI BMI (Calculated): 52.6   Pharmacotherapy Plan Continue and refill   Philip [name] 2 mg SQ weekly  Philip [name] is currently in the action stage of change. As such, his goal is to continue with weight loss efforts.  He has agreed to the Category 4 plan.  Exercise goals: All adults should avoid inactivity. Some physical activity is better than none, and adults who participate in any amount of physical activity gain some health benefits.  Behavioral modification strategies: increasing lean protein intake, no meal skipping, meal planning , increase water intake, planning for success, and increasing vegetables.  Philip [name] has agreed to follow-up with our clinic in 4 weeks.       Objective:   VITALS: Per patient if applicable, see vitals. GENERAL: Alert and in no acute distress. CARDIOPULMONARY: No increased WOB. Speaking in clear sentences.  PSYCH: Pleasant and cooperative. Speech normal rate and rhythm. Affect is appropriate. Insight and judgement are appropriate. Attention is focused, linear, and appropriate.  NEURO: Oriented as arrived to appointment on time with no prompting.   Attestation Statements:    This was prepared with the assistance of Engineer, civil (consulting).  Occasional wrong-word or sound-a-like substitutions may have occurred due to the inherent limitations of voice recognition software.   Corinna Capra, DO

## 2023-02-24 LAB — MICROALBUMIN / CREATININE URINE RATIO
Creatinine, Urine: 68.5 mg/dL
Microalb/Creat Ratio: 2575 mg/g creat — ABNORMAL HIGH (ref 0–29)
Microalbumin, Urine: 1764 ug/mL

## 2023-02-26 LAB — HEMOGLOBIN A1C
Est. average glucose Bld gHb Est-mCnc: 237 mg/dL
Hgb A1c MFr Bld: 9.9 % — ABNORMAL HIGH (ref 4.8–5.6)

## 2023-02-26 LAB — LIPID PANEL WITH LDL/HDL RATIO
Cholesterol, Total: 211 mg/dL — ABNORMAL HIGH (ref 100–199)
HDL: 42 mg/dL (ref >39)
LDL Chol Calc (NIH): 135 mg/dL — ABNORMAL HIGH (ref 0–99)
LDL/HDL Ratio: 3.2 ratio (ref 0.0–3.6)
Triglycerides: 189 mg/dL — ABNORMAL HIGH (ref 0–149)
VLDL Cholesterol Cal: 34 mg/dL (ref 5–40)

## 2023-02-26 LAB — COMPREHENSIVE METABOLIC PANEL
ALT: 18 IU/L (ref 0–44)
AST: 17 IU/L (ref 0–40)
Albumin/Globulin Ratio: 1.3
Albumin: 3.9 g/dL (ref 3.8–4.9)
Alkaline Phosphatase: 61 IU/L (ref 44–121)
BUN/Creatinine Ratio: 20 (ref 9–20)
BUN: 27 mg/dL — ABNORMAL HIGH (ref 6–24)
Bilirubin Total: 0.4 mg/dL (ref 0.0–1.2)
CO2: 25 mmol/L (ref 20–29)
Calcium: 9.2 mg/dL (ref 8.7–10.2)
Chloride: 102 mmol/L (ref 96–106)
Creatinine, Ser: 1.38 mg/dL — ABNORMAL HIGH (ref 0.76–1.27)
Globulin, Total: 3 g/dL (ref 1.5–4.5)
Glucose: 180 mg/dL — ABNORMAL HIGH (ref 70–99)
Potassium: 4.7 mmol/L (ref 3.5–5.2)
Sodium: 140 mmol/L (ref 134–144)
Total Protein: 6.9 g/dL (ref 6.0–8.5)
eGFR: 60 mL/min/{1.73_m2} (ref >59)

## 2023-02-26 LAB — INSULIN, RANDOM: INSULIN: 8.5 u[IU]/mL (ref 2.6–24.9)

## 2023-02-26 LAB — VITAMIN D 25 HYDROXY (VIT D DEFICIENCY, FRACTURES): Vit D, 25-Hydroxy: 37.2 ng/mL (ref 30.0–100.0)

## 2023-03-21 ENCOUNTER — Other Ambulatory Visit: Payer: Self-pay | Admitting: Pulmonary Disease

## 2023-03-21 DIAGNOSIS — R609 Edema, unspecified: Secondary | ICD-10-CM

## 2023-03-28 ENCOUNTER — Encounter: Payer: Self-pay | Admitting: Bariatrics

## 2023-03-28 ENCOUNTER — Ambulatory Visit: Payer: 59 | Admitting: Bariatrics

## 2023-03-28 VITALS — BP 161/78 | HR 66 | Temp 98.0°F | Ht 71.0 in | Wt 379.0 lb

## 2023-03-28 DIAGNOSIS — Z7985 Long-term (current) use of injectable non-insulin antidiabetic drugs: Secondary | ICD-10-CM

## 2023-03-28 DIAGNOSIS — Z6841 Body Mass Index (BMI) 40.0 and over, adult: Secondary | ICD-10-CM

## 2023-03-28 DIAGNOSIS — E559 Vitamin D deficiency, unspecified: Secondary | ICD-10-CM | POA: Diagnosis not present

## 2023-03-28 DIAGNOSIS — E781 Pure hyperglyceridemia: Secondary | ICD-10-CM | POA: Diagnosis not present

## 2023-03-28 DIAGNOSIS — E1165 Type 2 diabetes mellitus with hyperglycemia: Secondary | ICD-10-CM

## 2023-03-28 DIAGNOSIS — I152 Hypertension secondary to endocrine disorders: Secondary | ICD-10-CM

## 2023-03-28 DIAGNOSIS — E1129 Type 2 diabetes mellitus with other diabetic kidney complication: Secondary | ICD-10-CM

## 2023-03-28 DIAGNOSIS — R809 Proteinuria, unspecified: Secondary | ICD-10-CM

## 2023-03-28 DIAGNOSIS — E1159 Type 2 diabetes mellitus with other circulatory complications: Secondary | ICD-10-CM

## 2023-03-28 MED ORDER — AMLODIPINE BESYLATE 10 MG PO TABS: 10.0000 mg | ORAL_TABLET | Freq: Every day | ORAL | 0 refills | Status: DC

## 2023-03-28 MED ORDER — TIRZEPATIDE 12.5 MG/0.5ML ~~LOC~~ SOAJ: 12.5000 mg | SUBCUTANEOUS | 0 refills | Status: DC

## 2023-03-28 MED ORDER — FENOFIBRATE 145 MG PO TABS: 145.0000 mg | ORAL_TABLET | Freq: Every day | ORAL | 0 refills | Status: DC

## 2023-03-28 MED ORDER — VITAMIN D (ERGOCALCIFEROL) 1.25 MG (50000 UNIT) PO CAPS: 50000.0000 [IU] | ORAL_CAPSULE | ORAL | 0 refills | Status: DC

## 2023-03-28 NOTE — Progress Notes (Signed)
WEIGHT SUMMARY AND BIOMETRICS  Weight Gained Since Last Visit: 2lb   Vitals Temp: 98 F (36.7 C) BP: (!) 161/78 Pulse Rate: 66 SpO2: 99 %   Anthropometric Measurements Height: 5\' 11"  (1.803 m) Weight: (!) 379 lb (171.9 kg) BMI (Calculated): 52.88 Weight at Last Visit: 377lb Weight Gained Since Last Visit: 2lb Starting Weight: 409lb Total Weight Loss (lbs): 30 lb (13.6 kg)   Body Composition  Body Fat %: 47.5 % Fat Mass (lbs): 180.2 lbs Muscle Mass (lbs): 189.6 lbs Total Body Water (lbs): 156 lbs Visceral Fat Rating : 37   Other Clinical Data Fasting: no Labs: no Today's Visit #: 19 Starting Date: 11/20/18    OBESITY Philip [name] is here to discuss his progress with his obesity treatment plan along with follow-up of his obesity related diagnoses.     Nutrition Plan: the Category 4 plan - 70% adherence.  Current exercise: walking  Interim History:  He is up 2 lbs since his last visit.  Eating all of the food on the plan., Protein intake is as prescribed, Is exceeding snack calorie allotment, Water intake is adequate., and Denies polyphagia  Pharmacotherapy: Philip [name] is on Ozempic 2 mg SQ weekly Adverse side effects: None Hunger is moderately controlled.  Cravings are moderately controlled.  Assessment/Plan:   1. Vitamin D deficiency  Vitamin D is not at goal of 50.  Most recent vitamin D level was 37.2. He is on  prescription ergocalciferol 50,000 IU weekly. Lab Results  Component Value Date   VD25OH 37.2 02/23/2023   VD25OH 34.5 11/16/2022   VD25OH 26.5 (L) 05/31/2022    Plan: Refill prescription vitamin D 50,000 IU weekly.   2. Type 2 diabetes mellitus with hyperglycemia, without long-term current use of insulin ([name]) HgbA1c is not at goal. Last A1c was 9.9 CBGs: Not checking      Episodes of hypoglycemia: no Medication(s): Ozempic 2 mg SQ weekly  Lab Results  Component Value Date   HGBA1C 9.9 (H) 02/23/2023   HGBA1C 10.3 (H)  11/16/2022   HGBA1C 13.2 (H) 03/30/2022   Lab Results  Component Value Date   LDLCALC 135 (H) 02/23/2023   CREATININE 1.38 (H) 02/23/2023   Lab Results  Component Value Date   GFR 85.57 12/29/2020   GFR 85.61 12/03/2020    Plan: Start Mounjaro 12.5 mg SQ weekly Stopped the Ozempic as he needs better appetite and glucose control.  Continue all other medications.  Will keep all carbohydrates low both sweets and starches.  Will continue exercise regimen to 30 to 60 minutes on most days of the week.  Aim for 7 to 9 hours of sleep nightly.  Eat more low glycemic index foods.  Will decrease any unhealthy snacks (reviewed healthy snacks).   3. Hypertriglyceridemia Hyperlipidemia LDL is not at goal. Medication(s): Tricor, and Lipitor.  Cardiovascular risk factors: advanced age (older than 27 for men, 49 for women), diabetes mellitus, dyslipidemia, hypertension, male gender, microalbuminuria, obesity (BMI >= 30 kg/m2), and sedentary lifestyle  Lab Results  Component Value Date   CHOL 211 (H) 02/23/2023   HDL 42 02/23/2023   LDLCALC 135 (H) 02/23/2023   TRIG 189 (H) 02/23/2023   Lab Results  Component Value Date   ALT 18 02/23/2023   AST 17 02/23/2023   ALKPHOS 61 02/23/2023   BILITOT 0.4 02/23/2023   The 10-year ASCVD risk score (Arnett DK, et al., 2019) is: 32%   Values used to calculate the score:     Age: 57  years     Sex: Male     Is Non-Hispanic African American: Yes     Diabetic: Yes     Tobacco smoker: No     Systolic Blood Pressure: 161 mmHg     Is BP treated: Yes     HDL Cholesterol: 42 mg/dL     Total Cholesterol: 211 mg/dL  Plan:  Continue statin.  Information sheet on healthy vs unhealthy fats.  Will avoid all trans fats.  Will read labels Will minimize saturated fats except the following: low fat meats in moderation, diary, and limited dark chocolate.  Increase Omega 3 in foods, and consider an Omega 3 supplement   4. Microalbuminuria:   See level  of:  He has a diagnosis of HTN and Type 2 diabetes.   Plan: Will intensify diabetic control and will add a new medication for blood pressure.   Hypertension Hypertension control uncertain.  Medication(s): Losartan/ HCTZ  BP Readings from Last 3 Encounters:  03/28/23 (!) 161/78  02/23/23 (!) 145/75  01/13/23 139/84   Lab Results  Component Value Date   CREATININE 1.38 (H) 02/23/2023   CREATININE 1.32 (H) 11/16/2022   CREATININE 1.18 05/31/2022   Lab Results  Component Value Date   GFR 85.57 12/29/2020   GFR 85.61 12/03/2020    Plan: Continue all antihypertensives at current dosages and will add Norvasc 10 mg.  No added salt. Will keep sodium content to 1,500 mg or less per day.     Morbid Obesity: Current BMI BMI (Calculated): 52.88   Pharmacotherapy Plan Start  Mounjaro 12.5 mg SQ weekly  Philip [name] is currently in the action stage of change. As such, his goal is to continue with weight loss efforts.  He has agreed to the Category 4 plan.  Exercise goals: All adults should avoid inactivity. Some physical activity is better than none, and adults who participate in any amount of physical activity gain some health benefits.  Behavioral modification strategies: increasing lean protein intake, decreasing simple carbohydrates , decrease eating out, meal planning , increase water intake, better snacking choices, planning for success, increasing vegetables, get rid of junk food in the home, and keep healthy foods in the home.  Philip [name] has agreed to follow-up with our clinic in 4 weeks.      Objective:   VITALS: Per patient if applicable, see vitals. GENERAL: Alert and in no acute distress. CARDIOPULMONARY: No increased WOB. Speaking in clear sentences.  PSYCH: Pleasant and cooperative. Speech normal rate and rhythm. Affect is appropriate. Insight and judgement are appropriate. Attention is focused, linear, and appropriate.  NEURO: Oriented as arrived to appointment on time with no  prompting.   Attestation Statements:    This was prepared with the assistance of Engineer, civil (consulting).  Occasional wrong-word or sound-a-like substitutions may have occurred due to the inherent limitations of voice recognition software.   Corinna Capra, DO

## 2023-03-30 ENCOUNTER — Telehealth (INDEPENDENT_AMBULATORY_CARE_PROVIDER_SITE_OTHER): Payer: Self-pay | Admitting: Bariatrics

## 2023-03-30 NOTE — Telephone Encounter (Signed)
[employer] is on the phone for PT a prior authorization for [facility], they stated that the didn't have a prior authorization on file. Patient has to take the medication on Thursday and wants to make this urgent. Please Advise. JE

## 2023-04-23 ENCOUNTER — Other Ambulatory Visit: Payer: Self-pay | Admitting: Bariatrics

## 2023-04-25 ENCOUNTER — Telehealth (INDEPENDENT_AMBULATORY_CARE_PROVIDER_SITE_OTHER): Payer: Self-pay | Admitting: Bariatrics

## 2023-04-25 MED ORDER — TIRZEPATIDE 12.5 MG/0.5ML ~~LOC~~ SOAJ: 12.5000 mg | SUBCUTANEOUS | 0 refills | Status: DC

## 2023-04-25 NOTE — Telephone Encounter (Signed)
Ok per Dr. Manson Passey to send in refill. Patient informed.

## 2023-04-25 NOTE — Telephone Encounter (Signed)
The patient mentioned that he requires one more dose of Monjauro before his next appointment. Please reach out to him to confirm.

## 2023-05-01 ENCOUNTER — Other Ambulatory Visit: Payer: Self-pay | Admitting: Bariatrics

## 2023-05-04 ENCOUNTER — Other Ambulatory Visit: Payer: Self-pay | Admitting: Nurse Practitioner

## 2023-05-04 ENCOUNTER — Telehealth (INDEPENDENT_AMBULATORY_CARE_PROVIDER_SITE_OTHER): Payer: Self-pay | Admitting: Bariatrics

## 2023-05-04 MED ORDER — AMLODIPINE BESYLATE 10 MG PO TABS: 10.0000 mg | ORAL_TABLET | Freq: Every day | ORAL | 0 refills | Status: DC

## 2023-05-04 NOTE — Telephone Encounter (Signed)
Pt called stating that he is completely out of amLODipine (NORVASC) 10 MG tablet. Pt stated that his pharmacy denied the refill request due to our office. Pt would like a call back today, as he states he must receive a refilll asap.

## 2023-05-04 NOTE — Telephone Encounter (Signed)
Notified patient Philip [name] sent over prescription for medication. Patient verbalized understanding.

## 2023-05-09 ENCOUNTER — Encounter: Payer: Self-pay | Admitting: Bariatrics

## 2023-05-09 ENCOUNTER — Ambulatory Visit (INDEPENDENT_AMBULATORY_CARE_PROVIDER_SITE_OTHER): Payer: 59 | Admitting: Bariatrics

## 2023-05-09 VITALS — BP 144/70 | HR 50 | Temp 98.0°F | Ht 71.0 in | Wt 379.0 lb

## 2023-05-09 DIAGNOSIS — I10 Essential (primary) hypertension: Secondary | ICD-10-CM | POA: Diagnosis not present

## 2023-05-09 DIAGNOSIS — E1165 Type 2 diabetes mellitus with hyperglycemia: Secondary | ICD-10-CM

## 2023-05-09 DIAGNOSIS — E559 Vitamin D deficiency, unspecified: Secondary | ICD-10-CM

## 2023-05-09 DIAGNOSIS — E785 Hyperlipidemia, unspecified: Secondary | ICD-10-CM | POA: Diagnosis not present

## 2023-05-09 DIAGNOSIS — Z6841 Body Mass Index (BMI) 40.0 and over, adult: Secondary | ICD-10-CM

## 2023-05-09 DIAGNOSIS — E781 Pure hyperglyceridemia: Secondary | ICD-10-CM

## 2023-05-09 DIAGNOSIS — Z7985 Long-term (current) use of injectable non-insulin antidiabetic drugs: Secondary | ICD-10-CM

## 2023-05-09 MED ORDER — AMLODIPINE BESYLATE 10 MG PO TABS: 10.0000 mg | ORAL_TABLET | Freq: Every day | ORAL | 0 refills | Status: DC

## 2023-05-09 MED ORDER — FENOFIBRATE 145 MG PO TABS
145.0000 mg | ORAL_TABLET | Freq: Every day | ORAL | 0 refills | Status: DC
Start: 2023-05-09 — End: 2023-07-27

## 2023-05-09 MED ORDER — TIRZEPATIDE 12.5 MG/0.5ML ~~LOC~~ SOAJ: 12.5000 mg | SUBCUTANEOUS | 0 refills | Status: DC

## 2023-05-09 MED ORDER — VITAMIN D (ERGOCALCIFEROL) 1.25 MG (50000 UNIT) PO CAPS
50000.0000 [IU] | ORAL_CAPSULE | ORAL | 0 refills | Status: DC
Start: 2023-05-09 — End: 2023-07-18

## 2023-05-09 NOTE — Progress Notes (Signed)
WEIGHT SUMMARY AND BIOMETRICS  Weight Lost Since Last Visit: 0  Weight Gained Since Last Visit: 0   Vitals Temp: 98 F (36.7 C) BP: (!) 160/76 Pulse Rate: (!) 50 SpO2: 100 %   Anthropometric Measurements Height: 5\' 11"  (1.803 m) Weight: (!) 379 lb (171.9 kg) BMI (Calculated): 52.88 Weight at Last Visit: 379lb Weight Lost Since Last Visit: 0 Weight Gained Since Last Visit: 0 Starting Weight: 409lb Total Weight Loss (lbs): 30 lb (13.6 kg)   Body Composition  Body Fat %: 47.5 % Fat Mass (lbs): 180 lbs Muscle Mass (lbs): 189.4 lbs Total Body Water (lbs): 155.4 lbs Visceral Fat Rating : 37   Other Clinical Data Fasting: no Labs: no Today's Visit #: 20 Starting Date: 11/20/18    OBESITY Dacorion is here to discuss his progress with his obesity treatment plan along with follow-up of his obesity related diagnoses.     Nutrition Plan: the Category 4 plan - 70% adherence.  Current exercise: walking  Interim History:  His weight remains the same as his last visit.  Eating all of the food on the plan., Is not drinking sugar sweetened beverages., Is not skipping meals, Meeting protein goals., Denies polyphagia, and Denies excessive cravings.  Pharmacotherapy: Darius is on Mounjaro 12.5 mg SQ weekly Adverse side effects: None Hunger is moderately controlled.  Cravings are moderately controlled.  Assessment/Plan:   1. Vitamin D deficiency Vitamin D Deficiency Vitamin D is not at goal of 50.  Most recent vitamin D level was 37.2. He is on  prescription ergocalciferol 50,000 IU weekly. Lab Results  Component Value Date   VD25OH 37.2 02/23/2023   VD25OH 34.5 11/16/2022   VD25OH 26.5 (L) 05/31/2022    Plan: Refill prescription vitamin D 50,000 IU weekly.   Type II Diabetes HgbA1c is not at goal. Last A1c was 9.9 CBGs: Not checking      Episodes of hypoglycemia: no Medication(s): Mounjaro 12.5 mg SQ weekly  Lab Results  Component Value Date    HGBA1C 9.9 (H) 02/23/2023   HGBA1C 10.3 (H) 11/16/2022   HGBA1C 13.2 (H) 03/30/2022   Lab Results  Component Value Date   LDLCALC 135 (H) 02/23/2023   CREATININE 1.38 (H) 02/23/2023   Lab Results  Component Value Date   GFR 85.57 12/29/2020   GFR 85.61 12/03/2020    Plan: Continue and refill Mounjaro 12.5 mg SQ weekly Continue all other medications.  Will keep all carbohydrates low both sweets and starches.  Will continue exercise regimen to 30 to 60 minutes on most days of the week.  Aim for 7 to 9 hours of sleep nightly.  Eat more low glycemic index foods.   Hyperlipidemia LDL is not at goal. Medication(s): Lipitor and Tricor Cardiovascular risk factors: advanced age (older than 44 for men, 13 for women), diabetes mellitus, dyslipidemia, hypertension, male gender, microalbuminuria, obesity (BMI >= 30 kg/m2), and sedentary lifestyle  Lab Results  Component Value Date   CHOL 211 (H) 02/23/2023   HDL 42 02/23/2023   LDLCALC 135 (H) 02/23/2023   TRIG 189 (H) 02/23/2023   Lab Results  Component Value Date   ALT 18 02/23/2023   AST 17 02/23/2023   ALKPHOS 61 02/23/2023   BILITOT 0.4 02/23/2023   The 10-year ASCVD risk score (Arnett DK, et al., 2019) is: 26.7%   Values used to calculate the score:     Age: 54 years     Sex: Male     Is Non-Hispanic African American:  Yes     Diabetic: Yes     Tobacco smoker: No     Systolic Blood Pressure: 144 mmHg     Is BP treated: Yes     HDL Cholesterol: 42 mg/dL     Total Cholesterol: 211 mg/dL  Plan:  Continue statin.  Information sheet on healthy vs unhealthy fats.  Will avoid all trans fats.  Will read labels Will minimize saturated fats except the following: low fat meats in moderation, diary, and limited dark chocolate.     Hypertension Hypertension improved.  Medication(s): Amlodipine 5 mg 1 daily , Hyzaar 100-12.5, and Atenolol  BP Readings from Last 3 Encounters:  05/09/23 (!) 144/70  03/28/23 (!) 161/78   02/23/23 (!) 145/75   Lab Results  Component Value Date   CREATININE 1.38 (H) 02/23/2023   CREATININE 1.32 (H) 11/16/2022   CREATININE 1.18 05/31/2022   Lab Results  Component Value Date   GFR 85.57 12/29/2020   GFR 85.61 12/03/2020    Plan: Continue all antihypertensives at current dosages. No added salt. Will keep sodium content to 1,500 mg or less per day.       Morbid Obesity: Current BMI BMI (Calculated): 52.88   Pharmacotherapy Plan Continue and refill  Mounjaro 12.5 mg SQ weekly May increase the dose at next visit.   Pat is currently in the action stage of change. As such, his goal is to continue with weight loss efforts.  He has agreed to the Category 4 plan.  Exercise goals: All adults should avoid inactivity. Some physical activity is better than none, and adults who participate in any amount of physical activity gain some health benefits.  Behavioral modification strategies: increasing lean protein intake, decreasing simple carbohydrates , meal planning , increase water intake, better snacking choices, planning for success, and keep healthy foods in the home.  Noctis has agreed to follow-up with our clinic in 4 weeks.      Objective:   VITALS: Per patient if applicable, see vitals. GENERAL: Alert and in no acute distress. CARDIOPULMONARY: No increased WOB. Speaking in clear sentences.  PSYCH: Pleasant and cooperative. Speech normal rate and rhythm. Affect is appropriate. Insight and judgement are appropriate. Attention is focused, linear, and appropriate.  NEURO: Oriented as arrived to appointment on time with no prompting.   Attestation Statements:   This was prepared with the assistance of Engineer, civil (consulting).  Occasional wrong-word or sound-a-like substitutions may have occurred due to the inherent limitations of voice recognition software.   Corinna Capra, DO

## 2023-05-17 ENCOUNTER — Telehealth: Payer: Self-pay | Admitting: Pulmonary Disease

## 2023-05-17 NOTE — Telephone Encounter (Signed)
Parameds calling to check to see if we received the Disability paperwork for pt. Stated pt. Has up coming apt. In Sept. And nothing in [facility] box and said they sent it on 8.21.24 and they are say it is of high priotity

## 2023-05-17 NOTE — Telephone Encounter (Signed)
Received a form from [employer] on 05/04/23 requesting health status for patient based on COVID-19 episodes.  Dr. Francine Graven completed and signed the form on 8/30 and I faxed to [facility] today fax# [phone]

## 2023-05-17 NOTE — Telephone Encounter (Signed)
Paperwork was given to [employer]

## 2023-05-23 ENCOUNTER — Other Ambulatory Visit: Payer: Self-pay | Admitting: Pulmonary Disease

## 2023-05-23 DIAGNOSIS — R609 Edema, unspecified: Secondary | ICD-10-CM

## 2023-05-27 ENCOUNTER — Other Ambulatory Visit: Payer: Self-pay | Admitting: Pulmonary Disease

## 2023-05-27 DIAGNOSIS — R609 Edema, unspecified: Secondary | ICD-10-CM

## 2023-06-08 ENCOUNTER — Encounter: Payer: Self-pay | Admitting: Pulmonary Disease

## 2023-06-08 ENCOUNTER — Ambulatory Visit (INDEPENDENT_AMBULATORY_CARE_PROVIDER_SITE_OTHER): Payer: 59 | Admitting: Pulmonary Disease

## 2023-06-08 VITALS — BP 120/80 | HR 70 | Ht 73.0 in | Wt 377.2 lb

## 2023-06-08 DIAGNOSIS — R609 Edema, unspecified: Secondary | ICD-10-CM | POA: Diagnosis not present

## 2023-06-08 DIAGNOSIS — G4733 Obstructive sleep apnea (adult) (pediatric): Secondary | ICD-10-CM

## 2023-06-08 DIAGNOSIS — J984 Other disorders of lung: Secondary | ICD-10-CM | POA: Diagnosis not present

## 2023-06-08 DIAGNOSIS — J4540 Moderate persistent asthma, uncomplicated: Secondary | ICD-10-CM | POA: Diagnosis not present

## 2023-06-08 LAB — BRAIN NATRIURETIC PEPTIDE: Pro B Natriuretic peptide (BNP): 34 pg/mL (ref 0.0–100.0)

## 2023-06-08 MED ORDER — FLUTICASONE-SALMETEROL 250-50 MCG/ACT IN AEPB: 1.0000 | INHALATION_SPRAY | Freq: Two times a day (BID) | RESPIRATORY_TRACT | 11 refills | Status: AC

## 2023-06-08 MED ORDER — SPIRIVA RESPIMAT 2.5 MCG/ACT IN AERS
2.0000 | INHALATION_SPRAY | Freq: Every day | RESPIRATORY_TRACT | 11 refills | Status: AC
Start: 2023-06-08 — End: ?

## 2023-06-08 MED ORDER — ALBUTEROL SULFATE HFA 108 (90 BASE) MCG/ACT IN AERS
2.0000 | INHALATION_SPRAY | RESPIRATORY_TRACT | 11 refills | Status: AC | PRN
Start: 2023-06-08 — End: ?

## 2023-06-08 MED ORDER — FUROSEMIDE 20 MG PO TABS
20.0000 mg | ORAL_TABLET | Freq: Every day | ORAL | 5 refills | Status: DC
Start: 2023-06-08 — End: 2023-09-21
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
## 2023-06-08 NOTE — Patient Instructions (Addendum)
I am proud of your weight loss so far! Keep up the great work.    Continue Philip [name] 1 puff twice daily and spiriva 2 puffs daily   Continue CPAP at night  We will check your BNP level  We will check your    Follow up in 6 months

## 2023-06-15 ENCOUNTER — Encounter: Payer: Self-pay | Admitting: Pulmonary Disease

## 2023-06-15 ENCOUNTER — Ambulatory Visit: Payer: 59 | Admitting: Bariatrics

## 2023-06-21 ENCOUNTER — Ambulatory Visit: Payer: 59 | Admitting: Bariatrics

## 2023-06-25 ENCOUNTER — Other Ambulatory Visit: Payer: Self-pay | Admitting: Bariatrics

## 2023-06-27 ENCOUNTER — Ambulatory Visit (INDEPENDENT_AMBULATORY_CARE_PROVIDER_SITE_OTHER): Payer: 59 | Admitting: Bariatrics

## 2023-06-27 ENCOUNTER — Encounter: Payer: Self-pay | Admitting: Bariatrics

## 2023-06-27 VITALS — BP 146/59 | HR 67 | Temp 97.9°F | Ht 71.0 in | Wt 368.0 lb

## 2023-06-27 DIAGNOSIS — E785 Hyperlipidemia, unspecified: Secondary | ICD-10-CM | POA: Diagnosis not present

## 2023-06-27 DIAGNOSIS — E1165 Type 2 diabetes mellitus with hyperglycemia: Secondary | ICD-10-CM

## 2023-06-27 DIAGNOSIS — Z6841 Body Mass Index (BMI) 40.0 and over, adult: Secondary | ICD-10-CM

## 2023-06-27 DIAGNOSIS — E559 Vitamin D deficiency, unspecified: Secondary | ICD-10-CM

## 2023-06-27 DIAGNOSIS — E1169 Type 2 diabetes mellitus with other specified complication: Secondary | ICD-10-CM | POA: Diagnosis not present

## 2023-06-27 DIAGNOSIS — Z7985 Long-term (current) use of injectable non-insulin antidiabetic drugs: Secondary | ICD-10-CM

## 2023-06-27 MED ORDER — TIRZEPATIDE 15 MG/0.5ML ~~LOC~~ SOAJ: 15.0000 mg | SUBCUTANEOUS | 0 refills | Status: DC

## 2023-06-27 NOTE — Progress Notes (Signed)
WEIGHT SUMMARY AND BIOMETRICS  Weight Lost Since Last Visit: 11lb  Vitals Temp: 97.9 F (36.6 C) BP: (!) 146/59 Pulse Rate: 67 SpO2: 98 %   Anthropometric Measurements Height: 5\' 11"  (1.803 m) Weight: (!) 368 lb (166.9 kg) BMI (Calculated): 51.35 Weight at Last Visit: 379lb Weight Lost Since Last Visit: 11lb Starting Weight: 409lb Total Weight Loss (lbs): 41 lb (18.6 kg)   Body Composition  Body Fat %: 47 % Fat Mass (lbs): 173.2 lbs Muscle Mass (lbs): 185.8 lbs Total Body Water (lbs): 153.8 lbs Visceral Fat Rating : 35   Other Clinical Data Fasting: no Labs: no Today's Visit #: 21    OBESITY Philip [name] is here to discuss his progress with his obesity treatment plan along with follow-up of his obesity related diagnoses.     Nutrition Plan: the Category 4 plan - 80% adherence.  Current exercise: walking  Interim History:  He is down 11 lbs since his last visit.  Eating all of the food on the plan., Protein intake is as prescribed, Is not skipping meals, and Water intake is adequate.  Pharmacotherapy: Philip [name] is on Mounjaro 12.5 mg SQ weekly Adverse side effects:  He has some mild itching at the site. He will take Claritin the day before and the day of taking his injection. He will use OTC cortisone cream on the site.  Hunger is moderately controlled.  Cravings are moderately controlled.  Assessment/Plan:   Type II Diabetes HgbA1c is not at goal. Last A1c was 9.9 CBGs: improvintg  Episodes of hypoglycemia: no Medication(s): Wegovy 2.4 mg SQ weekly and Mounjaro 15 mg SQ weekly  Lab Results  Component Value Date   HGBA1C 9.9 (H) 02/23/2023   HGBA1C 10.3 (H) 11/16/2022   HGBA1C 13.2 (H) 03/30/2022   Lab Results  Component Value Date   LDLCALC 135 (H) 02/23/2023   CREATININE 1.38 (H) 02/23/2023   Lab Results  Component Value Date   GFR 85.57 12/29/2020   GFR 85.61 12/03/2020    Plan: Continue and refill Mounjaro 15 mg SQ weekly Continue  all other medications.  Will keep all carbohydrates low both sweets and starches.  Will continue exercise regimen to 30 to 60 minutes on most days of the week.  Aim for 7 to 9 hours of sleep nightly.  Eat more low glycemic index foods.   Hyperlipidemia with DM type 2 :   Taking medications as directed.   Plan: Will check lipids. Continue medications. Continue diet and exercise.    Vitamin D deficiency:   Taking vitamin D prescription/OTC  Plan: Continue vitamin D. Will check vitamin D.   Labs done today (CMP, Lipids, HgbA1c, insulin, vitamin D).   Morbid Obesity: Current BMI BMI (Calculated): 51.35   Pharmacotherapy Plan Continue and refill  Mounjaro 15 mg SQ weekly (increased from 12.5 mg to 15 mg)  Philip [name] is currently in the action stage of change. As such, his goal is to continue with weight loss efforts.  He has agreed to the Category 4 plan.  Exercise goals: For substantial health benefits, adults should do at least 150 minutes (2 hours and 30 minutes) a week of moderate-intensity, or 75 minutes (1 hour and 15 minutes) a week of vigorous-intensity aerobic physical activity, or an equivalent combination of moderate- and vigorous-intensity aerobic activity. Aerobic activity should be performed in episodes of at least 10 minutes, and preferably, it should be spread throughout the week.  Behavioral modification strategies: increasing lean protein intake, decreasing simple carbohydrates ,  no meal skipping, meal planning , increase water intake, better snacking choices, and planning for success.  Philip [name] has agreed to follow-up with our clinic in 4 weeks.      Objective:   VITALS: Per patient if applicable, see vitals. GENERAL: Alert and in no acute distress. CARDIOPULMONARY: No increased WOB. Speaking in clear sentences.  PSYCH: Pleasant and cooperative. Speech normal rate and rhythm. Affect is appropriate. Insight and judgement are appropriate. Attention is focused, linear,  and appropriate.  NEURO: Oriented as arrived to appointment on time with no prompting.   Attestation Statements:   This was prepared with the assistance of Engineer, civil (consulting).  Occasional wrong-word or sound-a-like substitutions may have occurred due to the inherent limitations of voice recognition software.   Corinna Capra, DO

## 2023-06-28 LAB — COMPREHENSIVE METABOLIC PANEL
ALT: 17 [IU]/L (ref 0–44)
AST: 19 [IU]/L (ref 0–40)
Albumin: 3.9 g/dL (ref 3.8–4.9)
Alkaline Phosphatase: 65 [IU]/L (ref 44–121)
BUN/Creatinine Ratio: 20 (ref 9–20)
BUN: 34 mg/dL — ABNORMAL HIGH (ref 6–24)
Bilirubin Total: 0.5 mg/dL (ref 0.0–1.2)
CO2: 24 mmol/L (ref 20–29)
Calcium: 9.8 mg/dL (ref 8.7–10.2)
Chloride: 101 mmol/L (ref 96–106)
Creatinine, Ser: 1.67 mg/dL — ABNORMAL HIGH (ref 0.76–1.27)
Globulin, Total: 3 g/dL (ref 1.5–4.5)
Glucose: 186 mg/dL — ABNORMAL HIGH (ref 70–99)
Potassium: 4.8 mmol/L (ref 3.5–5.2)
Sodium: 139 mmol/L (ref 134–144)
Total Protein: 6.9 g/dL (ref 6.0–8.5)
eGFR: 48 mL/min/{1.73_m2} — ABNORMAL LOW (ref >59)

## 2023-06-28 LAB — LIPID PANEL WITH LDL/HDL RATIO
Cholesterol, Total: 201 mg/dL — ABNORMAL HIGH (ref 100–199)
HDL: 36 mg/dL — ABNORMAL LOW (ref >39)
LDL Chol Calc (NIH): 124 mg/dL — ABNORMAL HIGH (ref 0–99)
LDL/HDL Ratio: 3.4 {ratio} (ref 0.0–3.6)
Triglycerides: 231 mg/dL — ABNORMAL HIGH (ref 0–149)
VLDL Cholesterol Cal: 41 mg/dL — ABNORMAL HIGH (ref 5–40)

## 2023-06-28 LAB — VITAMIN D 25 HYDROXY (VIT D DEFICIENCY, FRACTURES): Vit D, 25-Hydroxy: 40.6 ng/mL (ref 30.0–100.0)

## 2023-06-28 LAB — HEMOGLOBIN A1C
Est. average glucose Bld gHb Est-mCnc: 212 mg/dL
Hgb A1c MFr Bld: 9 % — ABNORMAL HIGH (ref 4.8–5.6)

## 2023-06-28 LAB — INSULIN, RANDOM: INSULIN: 10.6 u[IU]/mL (ref 2.6–24.9)

## 2023-07-15 ENCOUNTER — Other Ambulatory Visit: Payer: Self-pay | Admitting: Bariatrics

## 2023-07-15 DIAGNOSIS — E559 Vitamin D deficiency, unspecified: Secondary | ICD-10-CM

## 2023-07-18 ENCOUNTER — Telehealth (INDEPENDENT_AMBULATORY_CARE_PROVIDER_SITE_OTHER): Payer: Self-pay | Admitting: Bariatrics

## 2023-07-18 DIAGNOSIS — E559 Vitamin D deficiency, unspecified: Secondary | ICD-10-CM

## 2023-07-18 MED ORDER — VITAMIN D (ERGOCALCIFEROL) 1.25 MG (50000 UNIT) PO CAPS: 50000.0000 [IU] | ORAL_CAPSULE | ORAL | 0 refills | Status: DC

## 2023-07-18 NOTE — Telephone Encounter (Signed)
Patient called stating that he needs the Vitamin D refilled. Patient's states that the RX was denied by his pharmacy. Patient wants a three month supply. Pt requests a call back.

## 2023-07-18 NOTE — Telephone Encounter (Signed)
Sent in and Mychart message sent.

## 2023-07-21 ENCOUNTER — Telehealth: Payer: Self-pay | Admitting: Internal Medicine

## 2023-07-21 NOTE — Telephone Encounter (Signed)
Rec'd another disability form on 06/10/23 from [facility] for patient - completed it and given to Dr. Francine Graven for signature.

## 2023-07-27 ENCOUNTER — Encounter: Payer: Self-pay | Admitting: Bariatrics

## 2023-07-27 ENCOUNTER — Ambulatory Visit (INDEPENDENT_AMBULATORY_CARE_PROVIDER_SITE_OTHER): Payer: 59 | Admitting: Bariatrics

## 2023-07-27 VITALS — BP 148/78 | HR 45 | Temp 98.1°F | Ht 71.0 in | Wt 369.0 lb

## 2023-07-27 DIAGNOSIS — E669 Obesity, unspecified: Secondary | ICD-10-CM

## 2023-07-27 DIAGNOSIS — I10 Essential (primary) hypertension: Secondary | ICD-10-CM

## 2023-07-27 DIAGNOSIS — Z6841 Body Mass Index (BMI) 40.0 and over, adult: Secondary | ICD-10-CM | POA: Diagnosis not present

## 2023-07-27 DIAGNOSIS — E781 Pure hyperglyceridemia: Secondary | ICD-10-CM | POA: Diagnosis not present

## 2023-07-27 MED ORDER — FENOFIBRATE 145 MG PO TABS: 145.0000 mg | ORAL_TABLET | Freq: Every day | ORAL | 0 refills | Status: DC

## 2023-07-27 MED ORDER — AMLODIPINE BESYLATE 10 MG PO TABS: 10.0000 mg | ORAL_TABLET | Freq: Every day | ORAL | 0 refills | Status: DC

## 2023-07-27 NOTE — Progress Notes (Signed)
[0;                                                                                                             WEIGHT SUMMARY AND BIOMETRICS  Weight Lost Since Last Visit: 0  Weight Gained Since Last Visit: 1   Vitals Temp: 98.1 F (36.7 C) Pulse Rate: (!) 45 SpO2: 100 %   Anthropometric Measurements Height: 5\' 11"  (1.803 m) Weight: (!) 369 lb (167.4 kg) BMI (Calculated): 51.49 Weight at Last Visit: 368lb Weight Lost Since Last Visit: 0 Weight Gained Since Last Visit: 1 Starting Weight: 409lb Total Weight Loss (lbs): 40 lb (18.1 kg)   Body Composition  Body Fat %: 47 % Fat Mass (lbs): 173.4 lbs Muscle Mass (lbs): 186 lbs Total Body Water (lbs): 152 lbs Visceral Fat Rating : 35   Other Clinical Data Fasting: no Labs: no Today's Visit #: 22 Starting Date: 11/20/18    OBESITY Philip [name] is here to discuss his progress with his obesity treatment plan along with follow-up of his obesity related diagnoses.    Nutrition Plan: the Category 4 plan - 80-90% adherence.  Current exercise: walking  Interim History:  He is up 1 lb Eating all of the food on the plan., Protein intake is as prescribed, Is skipping meals, and Water intake is adequate.   Pharmacotherapy: Philip [name] is on Mounjaro 15 mg SQ weekly Adverse side effects: None Hunger is moderately controlled.  Cravings are moderately controlled.  Assessment/Plan:   1. Hypertriglyceridemia  LDL is not at goal, but improved.  Medication(s): Tricor and Lipitor Cardiovascular risk factors: advanced age (older than 24 for men, 61 for women), diabetes mellitus, dyslipidemia, hypertension, male gender, microalbuminuria, obesity (BMI >= 30 kg/m2), and sedentary lifestyle  Lab Results  Component Value Date   CHOL 201 (H) 06/27/2023   HDL 36 (L) 06/27/2023   LDLCALC 124 (H) 06/27/2023   TRIG 231 (H) 06/27/2023   Lab Results  Component Value Date   ALT 17 06/27/2023   AST 19 06/27/2023   ALKPHOS 65 06/27/2023    BILITOT 0.5 06/27/2023   The 10-year ASCVD risk score (Arnett DK, et al., 2019) is: 36.3%   Values used to calculate the score:     Age: 57 years     Sex: Male     Is Non-Hispanic African American: Yes     Diabetic: Yes     Tobacco smoker: No     Systolic Blood Pressure: 172 mmHg     Is BP treated: Yes     HDL Cholesterol: 36 mg/dL     Total Cholesterol: 201 mg/dL  Plan:  Continue statin.  Information sheet on healthy vs unhealthy fats.  Will avoid all trans fats.  Will read labels Will minimize saturated fats except the following: low fat meats in moderation, diary, and limited dark chocolate.  Increase Omega 3 in foods, and consider an Omega 3 supplement.    Hypertension Hypertension stable. He states that his blood pressures at home are 120's and 130's and diastolic is around  80.  Medication(s): Amlodipine 10 mg 1 daily  Losartan -HCTZ  BP Readings from Last 3 Encounters:  07/27/23 (!) 148/78  06/27/23 (!) 146/59  06/08/23 120/80   Lab Results  Component Value Date   CREATININE 1.67 (H) 06/27/2023   CREATININE 1.38 (H) 02/23/2023   CREATININE 1.32 (H) 11/16/2022   Lab Results  Component Value Date   GFR 85.57 12/29/2020   GFR 85.61 12/03/2020    Plan: Continue all antihypertensives at current dosages. He will continue to check his blood pressures at home. No added salt. Will keep sodium content to 1,500 mg or less per day.     Generalized Obesity: Current BMI BMI (Calculated): 51.49   Pharmacotherapy Plan Continue  Mounjaro 15 mg SQ weekly  Philip [name] is currently in the action stage of change. As such, his goal is to continue with weight loss efforts.  He has agreed to the Category 4 plan.  Exercise goals: For substantial health benefits, adults should do at least 150 minutes (2 hours and 30 minutes) a week of moderate-intensity, or 75 minutes (1 hour and 15 minutes) a week of vigorous-intensity aerobic physical activity, or an equivalent combination of  moderate- and vigorous-intensity aerobic activity. Aerobic activity should be performed in episodes of at least 10 minutes, and preferably, it should be spread throughout the week.  Behavioral modification strategies: increasing lean protein intake, no meal skipping, decrease eating out, meal planning , increase water intake, better snacking choices, planning for success, increasing vegetables, and get rid of junk food in the home.  Philip [name] has agreed to follow-up with our clinic in 4 weeks.      Objective:   VITALS: Per patient if applicable, see vitals. GENERAL: Alert and in no acute distress. CARDIOPULMONARY: No increased WOB. Speaking in clear sentences.  PSYCH: Pleasant and cooperative. Speech normal rate and rhythm. Affect is appropriate. Insight and judgement are appropriate. Attention is focused, linear, and appropriate.  NEURO: Oriented as arrived to appointment on time with no prompting.   Attestation Statements:   This was prepared with the assistance of Engineer, civil (consulting).  Occasional wrong-word or sound-a-like substitutions may have occurred due to the inherent limitations of voice recognition   Corinna Capra, DO

## 2023-08-02 DIAGNOSIS — Z0289 Encounter for other administrative examinations: Secondary | ICD-10-CM

## 2023-08-02 NOTE — Telephone Encounter (Signed)
The forms we received were "Long COVID.Post COVID Syndrome Questionnaire"  and "Rest Questionnaire".  Dr. Francine Graven has completed them I have faxed them to [employer] at fax# [phone].  Mailed a hard copy to the patient.  $29 processing fee will be applied to the account.

## 2023-08-24 ENCOUNTER — Encounter: Payer: Self-pay | Admitting: Bariatrics

## 2023-08-24 ENCOUNTER — Ambulatory Visit (INDEPENDENT_AMBULATORY_CARE_PROVIDER_SITE_OTHER): Payer: 59 | Admitting: Bariatrics

## 2023-08-24 VITALS — BP 124/70 | HR 60 | Temp 97.9°F | Ht 71.0 in | Wt 373.0 lb

## 2023-08-24 DIAGNOSIS — Z7985 Long-term (current) use of injectable non-insulin antidiabetic drugs: Secondary | ICD-10-CM

## 2023-08-24 DIAGNOSIS — I152 Hypertension secondary to endocrine disorders: Secondary | ICD-10-CM

## 2023-08-24 DIAGNOSIS — Z6841 Body Mass Index (BMI) 40.0 and over, adult: Secondary | ICD-10-CM

## 2023-08-24 DIAGNOSIS — E1165 Type 2 diabetes mellitus with hyperglycemia: Secondary | ICD-10-CM

## 2023-08-24 DIAGNOSIS — E119 Type 2 diabetes mellitus without complications: Secondary | ICD-10-CM | POA: Diagnosis not present

## 2023-08-24 DIAGNOSIS — I10 Essential (primary) hypertension: Secondary | ICD-10-CM | POA: Diagnosis not present

## 2023-08-24 MED ORDER — TIRZEPATIDE 15 MG/0.5ML ~~LOC~~ SOAJ: 15.0000 mg | SUBCUTANEOUS | 0 refills | Status: DC

## 2023-08-24 NOTE — Progress Notes (Signed)
WEIGHT SUMMARY AND BIOMETRICS  Weight Lost Since Last Visit: 0  Weight Gained Since Last Visit: 4lb  Blood pressure: 124/70  Anthropometric Measurements Height: 5\' 11"  (1.803 m) Weight: (!) 373 lb (169.2 kg) BMI (Calculated): 52.05 Weight at Last Visit: 369lb Weight Lost Since Last Visit: 0 Weight Gained Since Last Visit: 4lb Starting Weight: 409lb Total Weight Loss (lbs): 36 lb (16.3 kg)   Body Composition  Body Fat %: 47.6 % Fat Mass (lbs): 177.6 lbs Muscle Mass (lbs): 185.8 lbs Total Body Water (lbs): 158 lbs Visceral Fat Rating : 36   Other Clinical Data Fasting: no Labs: no Today's Visit #: 23 Starting Date: 11/20/18    OBESITY Philip [name] is here to discuss his progress with his obesity treatment plan along with follow-up of his obesity related diagnoses.    Nutrition Plan: the Category 4 plan - 50% adherence.  Current exercise: walking  Interim History:  He is up 4 lbs since his last visit. His water content is up 6 lbs per the bio-impedence.  Eating all of the food on the plan., Protein intake is as prescribed, Is not skipping meals, Meeting protein goals., and Water intake is adequate.   Pharmacotherapy: Philip [name] is on Mounjaro 15 mg SQ weekly Adverse side effects: None Hunger is moderately controlled.  Cravings are moderately controlled.  Assessment/Plan:   Type II Diabetes HgbA1c is not at goal. Last A1c was 9.0 His HgbA1c is improving over time.  Episodes of hypoglycemia: no Medication(s): Mounjaro 15 mg SQ weekly  He also takes [place] and Actos   Lab Results  Component Value Date   HGBA1C 9.0 (H) 06/27/2023   HGBA1C 9.9 (H) 02/23/2023   HGBA1C 10.3 (H) 11/16/2022   Lab Results  Component Value Date   LDLCALC 124 (H) 06/27/2023   CREATININE 1.67 (H) 06/27/2023   Lab Results  Component Value Date   GFR 85.57 12/29/2020   GFR  85.61 12/03/2020    Plan: Continue and refill Mounjaro 15 mg SQ weekly Continue all other medications.  Will keep all carbohydrates low both sweets and starches.  Will continue exercise regimen to 30 to 60 minutes on most days of the week.  Aim for 7 to 9 hours of sleep nightly.  Eat more low glycemic index foods.  Will eat out less frequently.   Hypertension Hypertension stable.  Medication(s): Amlodipine 10 mg 1 daily , Atenolol 50 mg, and Hyzaar 100/12.5 mg daily   BP Readings from Last 3 Encounters:  08/24/23 124/70  07/27/23 (!) 148/78  06/27/23 (!) 146/59   Lab Results  Component Value Date   CREATININE 1.67 (H) 06/27/2023   CREATININE 1.38 (H) 02/23/2023   CREATININE 1.32 (H) 11/16/2022   Lab Results  Component Value Date   GFR 85.57 12/29/2020   GFR 85.61 12/03/2020    Plan: Continue all antihypertensives at current dosages. No  added salt. Will keep sodium content to 1,500 mg or less per day.    Morbid Obesity: Current BMI BMI (Calculated): 52.05   Pharmacotherapy Plan Continue and refill  Mounjaro 15 mg SQ weekly  Philip [name] is currently in the action stage of change. As such, his goal is to continue with weight loss efforts.  He has agreed to the Category 4 plan.  Exercise goals: All adults should avoid inactivity. Some physical activity is better than none, and adults who participate in any amount of physical activity gain some health benefits.  Behavioral modification strategies: increasing lean protein intake, no meal skipping, decrease eating out, meal planning , increase water intake, increasing vegetables, decrease junk food, get rid of junk food in the home, and mindful eating.  Philip [name] has agreed to follow-up with our clinic in 4 weeks.   No orders of the defined types were placed in this encounter.   Medications Discontinued During This Encounter  Medication Reason   tirzepatide [facility]) 15 MG/0.5ML Pen Reorder     Meds ordered this encounter   Medications   tirzepatide (MOUNJARO) 15 MG/0.5ML Pen    Sig: Inject 15 mg into the skin once a week.    Dispense:  6 mL    Refill:  0      Objective:   VITALS: Per patient if applicable, see vitals. GENERAL: Alert and in no acute distress. CARDIOPULMONARY: No increased WOB. Speaking in clear sentences.  PSYCH: Pleasant and cooperative. Speech normal rate and rhythm. Affect is appropriate. Insight and judgement are appropriate. Attention is focused, linear, and appropriate.  NEURO: Oriented as arrived to appointment on time with no prompting.   Attestation Statements:    This was prepared with the assistance of Engineer, civil (consulting).  Occasional wrong-word or sound-a-like substitutions may have occurred due to the inherent limitations of voice recognition   Corinna Capra, DO

## 2023-09-21 ENCOUNTER — Ambulatory Visit: Payer: 59 | Admitting: Bariatrics

## 2023-09-21 ENCOUNTER — Encounter: Payer: Self-pay | Admitting: Bariatrics

## 2023-09-21 VITALS — BP 140/78 | HR 71 | Temp 98.0°F | Ht 71.0 in | Wt 370.0 lb

## 2023-09-21 DIAGNOSIS — Z6841 Body Mass Index (BMI) 40.0 and over, adult: Secondary | ICD-10-CM

## 2023-09-21 DIAGNOSIS — E66813 Obesity, class 3: Secondary | ICD-10-CM

## 2023-09-21 DIAGNOSIS — E781 Pure hyperglyceridemia: Secondary | ICD-10-CM

## 2023-09-21 DIAGNOSIS — E559 Vitamin D deficiency, unspecified: Secondary | ICD-10-CM | POA: Diagnosis not present

## 2023-09-21 DIAGNOSIS — I10 Essential (primary) hypertension: Secondary | ICD-10-CM | POA: Diagnosis not present

## 2023-09-21 MED ORDER — FENOFIBRATE 145 MG PO TABS: 145.0000 mg | ORAL_TABLET | Freq: Every day | ORAL | 0 refills | Status: DC

## 2023-09-21 MED ORDER — LOSARTAN POTASSIUM-HCTZ 100-25 MG PO TABS: 1.0000 | ORAL_TABLET | Freq: Every day | ORAL | 0 refills | Status: DC

## 2023-09-21 MED ORDER — AMLODIPINE BESYLATE 10 MG PO TABS: 10.0000 mg | ORAL_TABLET | Freq: Every day | ORAL | 0 refills | Status: DC

## 2023-09-21 MED ORDER — VITAMIN D (ERGOCALCIFEROL) 1.25 MG (50000 UNIT) PO CAPS: 50000.0000 [IU] | ORAL_CAPSULE | ORAL | 0 refills | Status: DC

## 2023-09-21 NOTE — Progress Notes (Signed)
 WEIGHT SUMMARY AND BIOMETRICS  Weight Lost Since Last Visit: 3lb  Weight Gained Since Last Visit: 0   Vitals Temp: 98 F (36.7 C) BP: (!) 140/78 Pulse Rate: 71 SpO2: 100 %   Anthropometric Measurements Height: 5' 11 (1.803 m) Weight: (!) 370 lb (167.8 kg) BMI (Calculated): 51.63 Weight at Last Visit: 373lb Weight Lost Since Last Visit: 3lb Weight Gained Since Last Visit: 0 Starting Weight: 409lb Total Weight Loss (lbs): 39 lb (17.7 kg)   Body Composition  Body Fat %: 46.8 % Fat Mass (lbs): 173.6 lbs Muscle Mass (lbs): 187.6 lbs Visceral Fat Rating : 35   Other Clinical Data Fasting: yes Labs: no Today's Visit #: 24 Starting Date: 11/20/18    OBESITY Philip [name] is here to discuss his progress with his obesity treatment plan along with follow-up of his obesity related diagnoses.    Nutrition Plan: the Category 4 plan - 70-80% adherence.  Current exercise: walking  Interim History:  He is down another 3 lbs.  Eating all of the food on the plan., Protein intake is as prescribed, Is not skipping meals, and Water intake is adequate.   Pharmacotherapy: Philip [name] is on Mounjaro  15 mg SQ weekly Adverse side effects: None Hunger is moderately controlled.  Cravings are moderately controlled.  Assessment/Plan:   1. Vitamin D  deficiency  Vitamin D  is not at goal of 50.  Most recent vitamin D  level was 40.6. He is on  prescription ergocalciferol  50,000 IU weekly. Lab Results  Component Value Date   VD25OH 40.6 06/27/2023   VD25OH 37.2 02/23/2023   VD25OH 34.5 11/16/2022    Plan: Refill prescription vitamin D  50,000 IU weekly.   2. Hypertriglyceridemia  LDL is not at goal. Triglycerides are at 231.  Medication(s): fenofibrate  and Lipitor.  Cardiovascular risk factors: advanced age (older than 53 for men, 27 for women), diabetes mellitus,  dyslipidemia, hypertension, male gender, obesity (BMI >= 30 kg/m2), and sedentary lifestyle  Lab Results  Component Value Date   CHOL 201 (H) 06/27/2023   HDL 36 (L) 06/27/2023   LDLCALC 124 (H) 06/27/2023   TRIG 231 (H) 06/27/2023   Lab Results  Component Value Date   ALT 17 06/27/2023   AST 19 06/27/2023   ALKPHOS 65 06/27/2023   BILITOT 0.5 06/27/2023   The 10-year ASCVD risk score (Arnett DK, et al., 2019) is: 26.2%   Values used to calculate the score:     Age: 58 years     Sex: Male     Is Non-Hispanic African American: Yes     Diabetic: Yes     Tobacco smoker: No     Systolic Blood Pressure: 140 mmHg     Is BP treated: Yes     HDL Cholesterol: 36 mg/dL     Total Cholesterol: 201 mg/dL  Plan:  Continue statin and fenofibrate .   Information  sheet on healthy vs unhealthy fats.  Will avoid all trans fats.  Will read labels Will minimize saturated fats except the following: low fat meats in moderation, diary, and limited dark chocolate.   Hypertension Hypertension control uncertain.  Medication(s): Amlodipine  10 mg 1 daily , Atenolol 50 mg, and Hyzaar 100/12.5 mg daily   BP Readings from Last 3 Encounters:  09/21/23 (!) 140/78  08/24/23 124/70  07/27/23 (!) 148/78   Lab Results  Component Value Date   CREATININE 1.67 (H) 06/27/2023   CREATININE 1.38 (H) 02/23/2023   CREATININE 1.32 (H) 11/16/2022   Lab Results  Component Value Date   GFR 85.57 12/29/2020   GFR 85.61 12/03/2020    Plan: Increase his Hyzaar from 100-12.5 to 100-25 mg daily.  No added salt. Will keep sodium content to 1,500 mg or less per day.    Morbid Obesity: Current BMI BMI (Calculated): 51.63   Pharmacotherapy Plan Continue   Mounjaro  15 mg SQ weekly  Philip [name] is currently in the action stage of change. As such, his goal is to continue with weight loss efforts.  He has agreed to the Category 4 plan.  Exercise goals: All adults should avoid inactivity. Some physical activity is  better than none, and adults who participate in any amount of physical activity gain some health benefits.  Behavioral modification strategies: increasing lean protein intake, no meal skipping, meal planning , increase water intake, better snacking choices, planning for success, increasing vegetables, increasing fiber rich foods, decrease junk food, get rid of junk food in the home, avoiding temptations, keep healthy foods in the home, and travel eating strategies.  Philip [name] has agreed to follow-up with our clinic in 4 weeks.      Objective:   VITALS: Per patient if applicable, see vitals. GENERAL: Alert and in no acute distress. CARDIOPULMONARY: No increased WOB. Speaking in clear sentences.  PSYCH: Pleasant and cooperative. Speech normal rate and rhythm. Affect is appropriate. Insight and judgement are appropriate. Attention is focused, linear, and appropriate.  NEURO: Oriented as arrived to appointment on time with no prompting.   Attestation Statements:    This was prepared with the assistance of Engineer, Civil (consulting).  Occasional wrong-word or sound-a-like substitutions may have occurred due to the inherent limitations of voice recognition   Clayborne Daring, DO

## 2023-10-25 ENCOUNTER — Encounter: Payer: Self-pay | Admitting: Bariatrics

## 2023-10-25 ENCOUNTER — Ambulatory Visit (INDEPENDENT_AMBULATORY_CARE_PROVIDER_SITE_OTHER): Payer: 59 | Admitting: Bariatrics

## 2023-10-25 VITALS — BP 148/70 | HR 48 | Temp 97.7°F | Ht 71.0 in | Wt 361.0 lb

## 2023-10-25 DIAGNOSIS — E1165 Type 2 diabetes mellitus with hyperglycemia: Secondary | ICD-10-CM

## 2023-10-25 DIAGNOSIS — E119 Type 2 diabetes mellitus without complications: Secondary | ICD-10-CM | POA: Diagnosis not present

## 2023-10-25 DIAGNOSIS — Z6841 Body Mass Index (BMI) 40.0 and over, adult: Secondary | ICD-10-CM

## 2023-10-25 DIAGNOSIS — I10 Essential (primary) hypertension: Secondary | ICD-10-CM

## 2023-10-25 DIAGNOSIS — Z7985 Long-term (current) use of injectable non-insulin antidiabetic drugs: Secondary | ICD-10-CM

## 2023-10-25 MED ORDER — TIRZEPATIDE 15 MG/0.5ML ~~LOC~~ SOAJ: 15.0000 mg | SUBCUTANEOUS | 0 refills | Status: DC

## 2023-10-25 NOTE — Progress Notes (Signed)
WEIGHT SUMMARY AND BIOMETRICS  Weight Lost Since Last Visit: 9lb  Weight Gained Since Last Visit: 0   Vitals Temp: 97.7 F (36.5 C) BP: (!) 148/70 Pulse Rate: (!) 48 SpO2: 100 %   Anthropometric Measurements Height: 5\' 11"  (1.803 m) Weight: (!) 361 lb (163.7 kg) BMI (Calculated): 50.37 Weight at Last Visit: 370lb Weight Lost Since Last Visit: 9lb Weight Gained Since Last Visit: 0 Starting Weight: 409lb Total Weight Loss (lbs): 48 lb (21.8 kg)   Body Composition  Body Fat %: 46.8 % Fat Mass (lbs): 169.2 lbs Muscle Mass (lbs): 183 lbs Total Body Water (lbs): 43.1 lbs Visceral Fat Rating : 35   Other Clinical Data Fasting: yes Labs: no Today's Visit #: 25 Starting Date: 11/20/18    OBESITY Philip [name] is here to discuss his progress with his obesity treatment plan along with follow-up of his obesity related diagnoses.    Nutrition Plan: the Category 4 plan - 80% adherence.  Current exercise: walking  Interim History:  He is down another 9 lbs since his last visit.  Eating all of the food on the plan., Protein intake is as prescribed, Is not skipping meals, Not journaling consistently., Meeting protein goals., Water intake is adequate., and Denies polyphagia   Pharmacotherapy: Philip [name] is on Mounjaro 15 mg SQ weekly Adverse side effects: None Hunger is moderately controlled.  Cravings are moderately controlled.  Assessment/Plan:   Type II Diabetes HgbA1c is not at goal. Last A1c was 9.0 CBGs: Not checking      Episodes of hypoglycemia: no Medication(s): Mounjaro 15 mg SQ weekly  Lab Results  Component Value Date   HGBA1C 9.0 (H) 06/27/2023   HGBA1C 9.9 (H) 02/23/2023   HGBA1C 10.3 (H) 11/16/2022   Lab Results  Component Value Date   LDLCALC 124 (H) 06/27/2023   CREATININE 1.67 (H) 06/27/2023   Lab Results  Component Value Date   GFR  85.57 12/29/2020   GFR 85.61 12/03/2020    Plan: Continue and refill Mounjaro 15 mg SQ weekly Continue all other medications.  Will keep all carbohydrates low both sweets and starches.  Will continue exercise regimen to 30 to 60 minutes on most days of the week.  Aim for 7 to 9 hours of sleep nightly.  Eat more low glycemic index foods.    Hypertension Hypertension control uncertain.  Medication(s): Amlodipine 10 mg 1 daily , Atenolol 50 mg, and Hyzaar 100/12.5 mg daily   BP Readings from Last 3 Encounters:  10/25/23 (!) 148/70  09/21/23 (!) 140/78  08/24/23 124/70   Lab Results  Component Value Date   CREATININE 1.67 (H) 06/27/2023   CREATININE 1.38 (H) 02/23/2023   CREATININE 1.32 (H) 11/16/2022   Lab Results  Component Value Date   GFR 85.57 12/29/2020   GFR 85.61 12/03/2020    Plan: Continue all antihypertensives at current dosages. No  added salt. Will keep sodium content to 1,500 mg or less per day.      Morbid Obesity: Current BMI BMI (Calculated): 50.37   Pharmacotherapy Plan Continue and refill  Mounjaro 15 mg SQ weekly  Philip [name] is currently in the action stage of change. As such, his goal is to continue with weight loss efforts.  He has agreed to the Category 4 plan.  Exercise goals: All adults should avoid inactivity. Some physical activity is better than none, and adults who participate in any amount of physical activity gain some health benefits.  Behavioral modification strategies: increasing lean protein intake, decreasing simple carbohydrates , no meal skipping, decrease eating out, better snacking choices, planning for success, and avoiding temptations.  Philip [name] has agreed to follow-up with our clinic in 4 weeks for visit and fasting labs.       Objective:   VITALS: Per patient if applicable, see vitals. GENERAL: Alert and in no acute distress. CARDIOPULMONARY: No increased WOB. Speaking in clear sentences.  PSYCH: Pleasant and cooperative.  Speech normal rate and rhythm. Affect is appropriate. Insight and judgement are appropriate. Attention is focused, linear, and appropriate.  NEURO: Oriented as arrived to appointment on time with no prompting.   Attestation Statements:    This was prepared with the assistance of Engineer, civil (consulting).  Occasional wrong-word or sound-a-like substitutions may have occurred due to the inherent limitations of voice recognition   Corinna Capra, DO

## 2023-10-28 ENCOUNTER — Other Ambulatory Visit (INDEPENDENT_AMBULATORY_CARE_PROVIDER_SITE_OTHER): Payer: Self-pay | Admitting: Bariatrics

## 2023-10-28 DIAGNOSIS — E559 Vitamin D deficiency, unspecified: Secondary | ICD-10-CM

## 2023-10-31 NOTE — Telephone Encounter (Signed)
Pt is calling requesting a refill on Vit D.  Pharmacy stated they do not have RX or he need a PA.

## 2023-11-01 ENCOUNTER — Other Ambulatory Visit: Payer: Self-pay

## 2023-11-01 DIAGNOSIS — E559 Vitamin D deficiency, unspecified: Secondary | ICD-10-CM

## 2023-11-01 MED ORDER — VITAMIN D (ERGOCALCIFEROL) 1.25 MG (50000 UNIT) PO CAPS: 50000.0000 [IU] | ORAL_CAPSULE | ORAL | 0 refills | Status: DC

## 2023-11-22 ENCOUNTER — Encounter: Payer: Self-pay | Admitting: Bariatrics

## 2023-11-22 ENCOUNTER — Ambulatory Visit (INDEPENDENT_AMBULATORY_CARE_PROVIDER_SITE_OTHER): Payer: 59 | Admitting: Bariatrics

## 2023-11-22 VITALS — BP 120/74 | HR 81 | Temp 98.1°F | Ht 71.0 in | Wt 359.0 lb

## 2023-11-22 DIAGNOSIS — Z6841 Body Mass Index (BMI) 40.0 and over, adult: Secondary | ICD-10-CM

## 2023-11-22 DIAGNOSIS — E785 Hyperlipidemia, unspecified: Secondary | ICD-10-CM | POA: Diagnosis not present

## 2023-11-22 DIAGNOSIS — E1165 Type 2 diabetes mellitus with hyperglycemia: Secondary | ICD-10-CM

## 2023-11-22 DIAGNOSIS — E781 Pure hyperglyceridemia: Secondary | ICD-10-CM

## 2023-11-22 DIAGNOSIS — E119 Type 2 diabetes mellitus without complications: Secondary | ICD-10-CM

## 2023-11-22 DIAGNOSIS — Z7985 Long-term (current) use of injectable non-insulin antidiabetic drugs: Secondary | ICD-10-CM

## 2023-11-22 DIAGNOSIS — E1169 Type 2 diabetes mellitus with other specified complication: Secondary | ICD-10-CM

## 2023-11-22 MED ORDER — FENOFIBRATE 145 MG PO TABS
145.0000 mg | ORAL_TABLET | Freq: Every day | ORAL | 0 refills | Status: DC
Start: 2023-11-22 — End: 2023-12-20

## 2023-11-22 MED ORDER — TIRZEPATIDE 15 MG/0.5ML ~~LOC~~ SOAJ: 15.0000 mg | SUBCUTANEOUS | 0 refills | Status: DC

## 2023-11-22 MED ORDER — LOSARTAN POTASSIUM-HCTZ 100-25 MG PO TABS: 1.0000 | ORAL_TABLET | Freq: Every day | ORAL | 0 refills | Status: DC

## 2023-11-22 NOTE — Progress Notes (Signed)
 WEIGHT SUMMARY AND BIOMETRICS  Weight Lost Since Last Visit: 2 lb  Weight Gained Since Last Visit: 0 lb   Vitals Temp: 98.1 F (36.7 C) BP: 120/74 Pulse Rate: 81 SpO2: 96 %   Anthropometric Measurements Height: 5\' 11"  (1.803 m) Weight: (!) 359 lb (162.8 kg) BMI (Calculated): 50.09 Weight at Last Visit: 361 lb Weight Lost Since Last Visit: 2 lb Weight Gained Since Last Visit: 0 lb Starting Weight: 409 lb Total Weight Loss (lbs): 50 lb (22.7 kg)   Body Composition  Body Fat %: 46.5 % Fat Mass (lbs): 167.4 lbs Muscle Mass (lbs): 183 lbs Total Body Water (lbs): 149.6 lbs Visceral Fat Rating : 34   Other Clinical Data Fasting: yes Labs: no Today's Visit #: 26 Starting Date: 11/20/18    OBESITY Jettie is here to discuss his progress with his obesity treatment plan along with follow-up of his obesity related diagnoses.    Nutrition Plan: the Category 4 plan - 90% adherence.  Current exercise: walking  Interim History:  He is down 2 lbs since his last visit.  Eating all of the food on the plan., Not eating all of the food on the plan., Protein intake is as prescribed, Is not skipping meals, and Water intake is adequate.   Pharmacotherapy: Bran is on Mounjaro 15 mg SQ weekly Adverse side effects: None Hunger is moderately controlled.  Cravings are moderately controlled.   Assessment/Plan:    Hypertriglyceridemia:  He has a history of high triglycerides. He is taking fenofibrate and tolerating well.    Plan: Rx: Fenofibrate 145 mg 1 daily, # 90 with no refills.     Type II Diabetes HgbA1c is not at goal. Last A1c was 9.0 Episodes of hypoglycemia: no Medication(s): Mounjaro 15 mg SQ weekly  Lab Results  Component Value Date   HGBA1C 9.0 (H) 06/27/2023   HGBA1C 9.9 (H) 02/23/2023   HGBA1C 10.3 (H) 11/16/2022   Lab Results  Component  Value Date   LDLCALC 124 (H) 06/27/2023   CREATININE 1.67 (H) 06/27/2023   Lab Results  Component Value Date   GFR 85.57 12/29/2020   GFR 85.61 12/03/2020    Plan: Continue and refill Mounjaro 15 mg SQ weekly Continue all other medications.  Will keep all carbohydrates low both sweets and starches.  Will continue exercise regimen to 30 to 60 minutes on most days of the week.  Aim for 7 to 9 hours of sleep nightly.  Eat more low glycemic index foods.    Hyperlipidemia LDL is not at goal. Medication(s): Lipitor Cardiovascular risk factors: advanced age (older than 51 for men, 51 for women), diabetes mellitus, dyslipidemia, hypertension, male gender, obesity (BMI >= 30 kg/m2), and sedentary lifestyle  Lab Results  Component Value Date   CHOL 201 (H) 06/27/2023   HDL 36 (L) 06/27/2023   LDLCALC 124 (H) 06/27/2023  TRIG 231 (H) 06/27/2023   Lab Results  Component Value Date   ALT 17 06/27/2023   AST 19 06/27/2023   ALKPHOS 65 06/27/2023   BILITOT 0.5 06/27/2023   The 10-year ASCVD risk score (Arnett DK, et al., 2019) is: 21.1%   Values used to calculate the score:     Age: 62 years     Sex: Male     Is Non-Hispanic African American: Yes     Diabetic: Yes     Tobacco smoker: No     Systolic Blood Pressure: 120 mmHg     Is BP treated: Yes     HDL Cholesterol: 36 mg/dL     Total Cholesterol: 201 mg/dL  Plan:  Continue statin.  Information sheet on healthy vs unhealthy fats.  Will avoid all trans fats.  Will read labels Will minimize saturated fats except the following: low fat meats in moderation, diary, and limited dark chocolate.  Will try to eat out less and cook more at home.     Morbid Obesity: Current BMI BMI (Calculated): 50.09   Pharmacotherapy Plan Continue and refill  Mounjaro 15 mg SQ weekly  Yassin is currently in the action stage of change. As such, his goal is to continue with weight loss efforts.  He has agreed to the Category 4  plan.  Exercise goals: All adults should avoid inactivity. Some physical activity is better than none, and adults who participate in any amount of physical activity gain some health benefits.  Behavioral modification strategies: increasing lean protein intake, decreasing simple carbohydrates , no meal skipping, decrease eating out, meal planning , increase water intake, better snacking choices, planning for success, decrease snacking , avoiding temptations, keep healthy foods in the home, and weigh protein portions.  Harl has agreed to follow-up with our clinic in 4 weeks. Will do labs at his next visit.        Objective:   VITALS: Per patient if applicable, see vitals. GENERAL: Alert and in no acute distress. CARDIOPULMONARY: No increased WOB. Speaking in clear sentences.  PSYCH: Pleasant and cooperative. Speech normal rate and rhythm. Affect is appropriate. Insight and judgement are appropriate. Attention is focused, linear, and appropriate.  NEURO: Oriented as arrived to appointment on time with no prompting.   Attestation Statements:    This was prepared with the assistance of Engineer, civil (consulting).  Occasional wrong-word or sound-a-like substitutions may have occurred due to the inherent limitations of voice recognition   Corinna Capra, DO

## 2023-11-30 LAB — LAB REPORT - SCANNED
A1c: 8.5
Calcium: 9.6
EGFR: 43
PSA, Total: 0.89
TSH: 3.17 (ref 0.41–5.90)

## 2023-12-04 ENCOUNTER — Other Ambulatory Visit: Payer: Self-pay | Admitting: Bariatrics

## 2023-12-05 ENCOUNTER — Telehealth: Payer: Self-pay | Admitting: Bariatrics

## 2023-12-05 NOTE — Telephone Encounter (Signed)
 Called the patient and notified patient that the pharmacy stated his medication is out of stock. Patient verbalized understanding.

## 2023-12-05 NOTE — Telephone Encounter (Signed)
 Pt is calling because pharmacy stated that the RX was cancel for [facility]. Pt took his last shot on Thursday 12/01/23. Please send rx to [facility] on [address].

## 2023-12-20 ENCOUNTER — Encounter: Payer: Self-pay | Admitting: Bariatrics

## 2023-12-20 ENCOUNTER — Ambulatory Visit (INDEPENDENT_AMBULATORY_CARE_PROVIDER_SITE_OTHER): Admitting: Bariatrics

## 2023-12-20 VITALS — BP 138/74 | HR 49 | Temp 97.8°F | Ht 71.0 in | Wt 358.0 lb

## 2023-12-20 DIAGNOSIS — Z7984 Long term (current) use of oral hypoglycemic drugs: Secondary | ICD-10-CM

## 2023-12-20 DIAGNOSIS — Z6841 Body Mass Index (BMI) 40.0 and over, adult: Secondary | ICD-10-CM

## 2023-12-20 DIAGNOSIS — E559 Vitamin D deficiency, unspecified: Secondary | ICD-10-CM

## 2023-12-20 DIAGNOSIS — E119 Type 2 diabetes mellitus without complications: Secondary | ICD-10-CM

## 2023-12-20 DIAGNOSIS — E1165 Type 2 diabetes mellitus with hyperglycemia: Secondary | ICD-10-CM

## 2023-12-20 DIAGNOSIS — I10 Essential (primary) hypertension: Secondary | ICD-10-CM | POA: Diagnosis not present

## 2023-12-20 DIAGNOSIS — I152 Hypertension secondary to endocrine disorders: Secondary | ICD-10-CM

## 2023-12-20 DIAGNOSIS — E781 Pure hyperglyceridemia: Secondary | ICD-10-CM | POA: Diagnosis not present

## 2023-12-20 DIAGNOSIS — Z7985 Long-term (current) use of injectable non-insulin antidiabetic drugs: Secondary | ICD-10-CM

## 2023-12-20 MED ORDER — AMLODIPINE BESYLATE 10 MG PO TABS: 10.0000 mg | ORAL_TABLET | Freq: Every day | ORAL | 0 refills | Status: DC

## 2023-12-20 MED ORDER — VITAMIN D (ERGOCALCIFEROL) 1.25 MG (50000 UNIT) PO CAPS: 50000.0000 [IU] | ORAL_CAPSULE | ORAL | 0 refills | Status: DC

## 2023-12-20 MED ORDER — LOSARTAN POTASSIUM-HCTZ 100-25 MG PO TABS: 1.0000 | ORAL_TABLET | Freq: Every day | ORAL | 0 refills | Status: DC

## 2023-12-20 MED ORDER — TIRZEPATIDE 15 MG/0.5ML ~~LOC~~ SOAJ: 15.0000 mg | SUBCUTANEOUS | 0 refills | Status: DC

## 2023-12-20 MED ORDER — FENOFIBRATE 145 MG PO TABS: 145.0000 mg | ORAL_TABLET | Freq: Every day | ORAL | 0 refills | Status: DC

## 2023-12-20 NOTE — Progress Notes (Signed)
 WEIGHT SUMMARY AND BIOMETRICS  Weight Lost Since Last Visit: 1lb  Weight Gained Since Last Visit: 0   Vitals Temp: 97.8 F (36.6 C) Pulse Rate: (!) 49 SpO2: 99 %   Anthropometric Measurements Height: 5\' 11"  (1.803 m) Weight: (!) 358 lb (162.4 kg) BMI (Calculated): 49.95 Weight at Last Visit: 359lb Weight Lost Since Last Visit: 1lb Weight Gained Since Last Visit: 0 Starting Weight: 409lb Total Weight Loss (lbs): 51 lb (23.1 kg)   Body Composition  Body Fat %: 46.5 % Fat Mass (lbs): 166.6 lbs Muscle Mass (lbs): 182.4 lbs Total Body Water (lbs): 153.8 lbs Visceral Fat Rating : 34   Other Clinical Data Fasting: yes Labs: no Today's Visit #: yes Starting Date: 11/20/18    OBESITY Philip [name] is here to discuss his progress with his obesity treatment plan along with follow-up of his obesity related diagnoses.    Nutrition Plan: the Category 4 plan - 85-90% adherence.  Current exercise: walking  Interim History:  He is down 1 pound since his last visit Eating all of the food on the plan., Protein intake is as prescribed, Not journaling consistently., and Water intake is adequate.   Pharmacotherapy: Philip [name] is on Mounjaro 15 mg SQ weekly Adverse side effects: None Hunger is moderately controlled.  Cravings are moderately controlled.  Assessment/Plan:   Hypertension Hypertension reasonably well controlled.  Medication(s): Amlodipine 10 mg 1 daily  and Atenolol 50 mg, and Hyzaar 100-25 mg daily.   BP Readings from Last 3 Encounters:  12/20/23 138/74  11/22/23 120/74  10/25/23 (!) 148/70   Lab Results  Component Value Date   CREATININE 1.67 (H) 06/27/2023   CREATININE 1.38 (H) 02/23/2023   CREATININE 1.32 (H) 11/16/2022   Lab Results  Component Value Date   GFR 85.57 12/29/2020   GFR 85.61 12/03/2020    Plan: Continue all antihypertensives  at current dosages. No added salt. Will keep sodium content to 1,500 mg or less per day.   Type II Diabetes HgbA1c is not at goal. Last A1c was 8.5 (new labs) Episodes of hypoglycemia: no Medication(s): Mounjaro 15 mg SQ weekly, Farxiga 10 mg.   Lab Results  Component Value Date   HGBA1C 9.0 (H) 06/27/2023   HGBA1C 9.9 (H) 02/23/2023   HGBA1C 10.3 (H) 11/16/2022   Lab Results  Component Value Date   LDLCALC 124 (H) 06/27/2023   CREATININE 1.67 (H) 06/27/2023   Lab Results  Component Value Date   GFR 85.57 12/29/2020   GFR 85.61 12/03/2020    Plan: Continue and refill Mounjaro 15 mg SQ weekly Continue all other medications.  Will keep all carbohydrates low both sweets and starches.  Will continue exercise regimen to 30 to 60 minutes on most days of the week.  Aim for 7 to 9 hours of sleep nightly.  Eat more low glycemic index foods.   Hypertriglyceridemia:  LDL is not at goal. Triglycerides are not at goal, but improved. At 231.  Medication(s): Fenofibrate  Cardiovascular risk factors: advanced age (older than 44 for men, 20 for women), diabetes mellitus, dyslipidemia, hypertension, male gender, obesity (BMI >= 30 kg/m2), and sedentary lifestyle  Lab Results  Component Value Date   CHOL 201 (H) 06/27/2023   HDL 36 (L) 06/27/2023   LDLCALC 124 (H) 06/27/2023   TRIG 231 (H) 06/27/2023   Lab Results  Component Value Date   ALT 17 06/27/2023   AST 19 06/27/2023   ALKPHOS 65 06/27/2023   BILITOT 0.5 06/27/2023   The 10-year ASCVD risk score (Philip [name], et al., 2019) is: 26.6%   Values used to calculate the score:     Age: 89 years     Sex: Male     Is Non-Hispanic African American: Yes     Diabetic: Yes     Tobacco smoker: No     Systolic Blood Pressure: 138 mmHg     Is BP treated: Yes     HDL Cholesterol: 36 mg/dL     Total Cholesterol: 201 mg/dL  Plan:  Continue statin.  Information sheet on healthy vs unhealthy fats.  Will avoid all trans fats.  Will  read labels Will minimize saturated fats except the following: low fat meats in moderation, diary, and limited dark chocolate.  Increase Omega 3 in foods, and consider an Omega 3 supplement.    Vitamin D Deficiency Vitamin D is not at goal of 50.  Most recent vitamin D level was 40.6. He is on  prescription ergocalciferol 50,000 IU weekly. Lab Results  Component Value Date   VD25OH 40.6 06/27/2023   VD25OH 37.2 02/23/2023   VD25OH 34.5 11/16/2022    Plan: Refill prescription vitamin D 50,000 IU every other week.     Labs reviewed today (CMP, Lipids, HgbA1c, vitamin D, B 12) from 11/30/2023    Morbid Obesity: Current BMI BMI (Calculated): 49.95   Pharmacotherapy Plan Continue and refill  Mounjaro 15 mg SQ weekly  Philip [name] is currently in the action stage of change. As such, his goal is to continue with weight loss efforts.  He has agreed to the Category 4 plan.  Exercise goals: All adults should avoid inactivity. Some physical activity is better than none, and adults who participate in any amount of physical activity gain some health benefits.  Behavioral modification strategies: no meal skipping, meal planning , increase water intake, better snacking choices, planning for success, keep healthy foods in the home, and weigh protein portions.  Philip [name] has agreed to follow-up with our clinic in 4 weeks.     Objective:   VITALS: Per patient if applicable, see vitals. GENERAL: Alert and in no acute distress. CARDIOPULMONARY: No increased WOB. Speaking in clear sentences.  PSYCH: Pleasant and cooperative. Speech normal rate and rhythm. Affect is appropriate. Insight and judgement are appropriate. Attention is focused, linear, and appropriate.  NEURO: Oriented as arrived to appointment on time with no prompting.   Attestation Statements:   This was prepared with the assistance of Engineer, civil (consulting).  Occasional wrong-word or sound-a-like substitutions may have occurred due to  the inherent limitations of voice recognition   Corinna Capra, DO

## 2023-12-29 ENCOUNTER — Encounter (HOSPITAL_BASED_OUTPATIENT_CLINIC_OR_DEPARTMENT_OTHER): Payer: Self-pay | Admitting: Emergency Medicine

## 2023-12-29 ENCOUNTER — Emergency Department (HOSPITAL_BASED_OUTPATIENT_CLINIC_OR_DEPARTMENT_OTHER)

## 2023-12-29 ENCOUNTER — Other Ambulatory Visit: Payer: Self-pay

## 2023-12-29 ENCOUNTER — Emergency Department (HOSPITAL_BASED_OUTPATIENT_CLINIC_OR_DEPARTMENT_OTHER)
Admission: EM | Admit: 2023-12-29 | Discharge: 2023-12-29 | Disposition: A | Discharge status: Home / Self Care | Attending: Emergency Medicine | Admitting: Emergency Medicine

## 2023-12-29 DIAGNOSIS — I10 Essential (primary) hypertension: Secondary | ICD-10-CM | POA: Diagnosis not present

## 2023-12-29 DIAGNOSIS — R42 Dizziness and giddiness: Secondary | ICD-10-CM | POA: Insufficient documentation

## 2023-12-29 DIAGNOSIS — R110 Nausea: Secondary | ICD-10-CM | POA: Diagnosis not present

## 2023-12-29 DIAGNOSIS — E119 Type 2 diabetes mellitus without complications: Secondary | ICD-10-CM | POA: Diagnosis not present

## 2023-12-29 DIAGNOSIS — Z79899 Other long term (current) drug therapy: Secondary | ICD-10-CM | POA: Insufficient documentation

## 2023-12-29 DIAGNOSIS — R008 Other abnormalities of heart beat: Secondary | ICD-10-CM | POA: Insufficient documentation

## 2023-12-29 DIAGNOSIS — R944 Abnormal results of kidney function studies: Secondary | ICD-10-CM | POA: Diagnosis not present

## 2023-12-29 DIAGNOSIS — I498 Other specified cardiac arrhythmias: Secondary | ICD-10-CM

## 2023-12-29 LAB — BASIC METABOLIC PANEL WITH GFR
Anion gap: 7 (ref 5–15)
BUN: 29 mg/dL — ABNORMAL HIGH (ref 6–20)
CO2: 28 mmol/L (ref 22–32)
Calcium: 9.6 mg/dL (ref 8.9–10.3)
Chloride: 103 mmol/L (ref 98–111)
Creatinine, Ser: 1.6 mg/dL — ABNORMAL HIGH (ref 0.61–1.24)
GFR, Estimated: 50 mL/min — ABNORMAL LOW (ref >60)
Glucose, Bld: 161 mg/dL — ABNORMAL HIGH (ref 70–99)
Potassium: 4 mmol/L (ref 3.5–5.1)
Sodium: 138 mmol/L (ref 135–145)

## 2023-12-29 LAB — CBC
HCT: 38.3 % — ABNORMAL LOW (ref 39.0–52.0)
Hemoglobin: 12.3 g/dL — ABNORMAL LOW (ref 13.0–17.0)
MCH: 28 pg (ref 26.0–34.0)
MCHC: 32.1 g/dL (ref 30.0–36.0)
MCV: 87 fL (ref 80.0–100.0)
Platelets: 361 10*3/uL (ref 150–400)
RBC: 4.4 MIL/uL (ref 4.22–5.81)
RDW: 14.5 % (ref 11.5–15.5)
WBC: 8.4 10*3/uL (ref 4.0–10.5)
nRBC: 0 % (ref 0.0–0.2)

## 2023-12-29 MED ORDER — GADOBUTROL 1 MMOL/ML IV SOLN
10.0000 mL | Freq: Once | INTRAVENOUS | Status: AC | PRN
  Administered 2023-12-29: 10 mL via INTRAVENOUS

## 2023-12-29 MED ORDER — ONDANSETRON HCL 4 MG/2ML IJ SOLN
4.0000 mg | Freq: Once | INTRAMUSCULAR | Status: AC
  Administered 2023-12-29: 4 mg via INTRAVENOUS
  Filled 2023-12-29: qty 2

## 2023-12-29 MED ORDER — MECLIZINE HCL 25 MG PO TABS
25.0000 mg | ORAL_TABLET | Freq: Once | ORAL | Status: AC
  Administered 2023-12-29: 25 mg via ORAL
  Filled 2023-12-29: qty 1

## 2023-12-29 MED ORDER — MECLIZINE HCL 25 MG PO TABS: 25.0000 mg | ORAL_TABLET | Freq: Three times a day (TID) | ORAL | 1 refills | Status: AC | PRN

## 2023-12-29 NOTE — ED Notes (Signed)
 [facility] called- Dr. Rochelle Chu to read ASAP.

## 2023-12-29 NOTE — ED Notes (Signed)
 Patient transported to MRI

## 2023-12-29 NOTE — ED Triage Notes (Addendum)
 Describes left ear fullness and reduced hearing x1 week. Dizzy x1 week. Denies confusion, vision changes. No other neuro deficits appreciated. HX vertigo. Left ear recently irrigated-appear clean- tympanic membance easily visualized. Denies CP, SOB.  EKG reads bigeminy in triage.

## 2023-12-29 NOTE — Discharge Instructions (Addendum)
 You are found to have extra heart beat on your EKG called a PVC (premature ventricular contraction).  This is unlikely to be the cause of your symptoms today.  However, you will need to follow-up with cardiology regarding this finding.  I placed a referral for you to our cardiology office. If you have not heard from the Cardiology office within the next 72 hours please call [phone].  Your kidney function (creatinine) was elevated today which means your kidneys are not filtering as well as they should be.  This has been elevated in the past and could be your baseline.  Please make your PCP aware of this finding within the next month and have them continue to monitor this value.  It also appears that your blood sugars have been elevated.  Please visit with your PCP within the next month for further management of your diabetes.  Return to the ER for any chest pain, shortness of breath, double vision, severe headaches, any other new or concerning symptoms.

## 2023-12-29 NOTE — ED Notes (Signed)
 Gold and blue additional labs sent to lab

## 2023-12-29 NOTE — ED Provider Notes (Signed)
 [place] EMERGENCY DEPARTMENT AT [facility] Provider Note   CSN: [id number] Arrival date & time: 12/29/23  1547     History  Chief Complaint  Patient presents with   Dizziness    Philip [name] is a 58 y.o. male with history of hypertension, type 2 diabetes, presents with concern for a fullness sensation in his right ear that has been ongoing for the past week.  He also reports associated dizziness sensation and nausea that is constant, but worsens when he goes to stand up.  States like it feels like he is "getting off of a merry-go-round".  Denies any recent illnesses.  Reports he has had similar symptoms "a while ago" but is unsure what helped them to go away.  Denies any chest pain, shortness of breath, or feeling faint. He tried taking dramamine at home and washing his ears without improvement in symptoms.   Dizziness      Home Medications Prior to Admission medications   Medication Sig Start Date End Date Taking? Authorizing Provider  tadalafil (CIALIS) 20 MG tablet Take 20 mg by mouth daily. 12/06/23  Yes [provider]  albuterol  (VENTOLIN  HFA) 108 (90 Base) MCG/ACT inhaler Inhale 2 puffs into the lungs every 4 (four) hours as needed for wheezing or shortness of breath. 06/08/23   Wilfredo Hanly, MD  amLODipine  (NORVASC ) 10 MG tablet Take 1 tablet (10 mg total) by mouth daily. 12/20/23   Kirk Peper A, DO  atenolol (TENORMIN) 50 MG tablet Take 50 mg by mouth daily. 11/12/20   [provider]  atorvastatin (LIPITOR) 40 MG tablet Take 40 mg by mouth daily. 03/20/22   [provider]  FARXIGA 10 MG TABS tablet Take 10 mg by mouth daily. 11/12/20   [provider]  fenofibrate  (TRICOR ) 145 MG tablet Take 1 tablet (145 mg total) by mouth daily. 12/20/23   Kirk Peper A, DO  fluticasone -salmeterol (WIXELA INHUB) 250-50 MCG/ACT AEPB Inhale 1 puff into the lungs in the morning and at bedtime. 06/08/23   Wilfredo Hanly, MD  loratadine  (CLARITIN) 10 MG tablet Take 10 mg by mouth daily.    [provider]  losartan -hydrochlorothiazide (HYZAAR) 100-25 MG tablet Take 1 tablet by mouth daily. 12/20/23   Lorayne Rocks, DO  oxymetazoline  (AFRIN NASAL SPRAY) 0.05 % nasal spray Place 1 spray into both nostrils 2 (two) times daily. 07/01/22   Ninetta Basket, MD  pioglitazone (ACTOS) 45 MG tablet Take 45 mg by mouth daily. 01/15/21   [provider]  Tiotropium Bromide Monohydrate  (SPIRIVA  RESPIMAT) 2.5 MCG/ACT AERS Inhale 2 puffs into the lungs daily. 06/08/23   Wilfredo Hanly, MD  tirzepatide  (MOUNJARO ) 15 MG/0.5ML Pen Inject 15 mg into the skin once a week. 12/20/23   Kirk Peper A, DO  Vitamin D , Ergocalciferol , (DRISDOL ) 1.25 MG (50000 UNIT) CAPS capsule Take 1 capsule (50,000 Units total) by mouth every 14 (fourteen) days. 12/20/23   Kirk Peper A, DO      Allergies    Metformin and Codeine    Review of Systems   Review of Systems  Neurological:  Positive for dizziness.    Physical Exam Updated Vital Signs BP (!) 154/76   Pulse (!) 52   Temp 98 F (36.7 C) (Oral)   Resp 16   SpO2 100%  Physical Exam Vitals and nursing note reviewed.  Constitutional:      General: He is not in acute distress.    Appearance: He  is well-developed.     Comments: Resting with eyes closed.  No active vomiting  HENT:     Head: Normocephalic and atraumatic.     Right Ear: Tympanic membrane normal.     Left Ear: Tympanic membrane normal.  Eyes:     Extraocular Movements: Extraocular movements intact.     Conjunctiva/sclera: Conjunctivae normal.     Pupils: Pupils are equal, round, and reactive to light.  Cardiovascular:     Rate and Rhythm: Normal rate. Rhythm irregular.     Heart sounds: No murmur heard. Pulmonary:     Effort: Pulmonary effort is normal. No respiratory distress.     Breath sounds: Normal breath sounds.  Abdominal:     Palpations: Abdomen is soft.     Tenderness: There is no abdominal  tenderness.  Musculoskeletal:        General: No swelling.     Cervical back: Neck supple.  Skin:    General: Skin is warm and dry.     Capillary Refill: Capillary refill takes less than 2 seconds.  Neurological:     Mental Status: He is alert.     Comments: Mental status: Alert and oriented to self, place, and month  Speech: Answers questions appropriately  Cranial Nerves: III, IV, VI: EOM intact, Pupils equal round and reactive, no gaze preference or deviation, no nystagmus. V: normal sensation in V1, V2, and V3 segments bilaterally VII: smiles, puffs cheeks, raises eyebrows, and closes eyes without asymmetry.  VIII: normal hearing to speech IX, X: normal palatal elevation, no uvular deviation XI: 5/5 head turn and 5/5 shoulder shrug bilaterally XII: midline tongue protrusion  Motor: 5/5 strength with resisted elbow flexion extension bilaterally, hand squeeze bilaterally, ankle plantarflexion and dorsiflexion bilaterally  Sensory: Intact sensation in upper and lower extremity bilaterally    Coordination: Normal finger to nose and heel to shin, no tremor  Gait: Normal   Psychiatric:        Mood and Affect: Mood normal.     ED Results / Procedures / Treatments   Labs (all labs ordered are listed, but only abnormal results are displayed) Labs Reviewed  CBC - Abnormal; Notable for the following components:      Result Value   Hemoglobin 12.3 (*)    HCT 38.3 (*)    All other components within normal limits  BASIC METABOLIC PANEL WITH GFR - Abnormal; Notable for the following components:   Glucose, Bld 161 (*)    BUN 29 (*)    Creatinine, Ser 1.60 (*)    GFR, Estimated 50 (*)    All other components within normal limits    EKG EKG Interpretation Date/Time:  Thursday December 29 2023 16:00:04 EDT Ventricular Rate:  70 PR Interval:  178 QRS Duration:  100 QT Interval:  416 QTC Calculation: 449 R Axis:   -59  Text Interpretation: Sinus rhythm with frequent  Premature ventricular complexes in a pattern of bigeminy Left axis deviation Cannot rule out Anterior infarct , age undetermined Abnormal ECG No previous ECGs available Confirmed by Zackowski, Scott [phone]) on 12/29/2023 5:15:58 PM  Radiology No results found.  Procedures Procedures    Medications Ordered in ED Medications  meclizine  (ANTIVERT ) tablet 25 mg (25 mg Oral Given 12/29/23 1658)  ondansetron  (ZOFRAN ) injection 4 mg (4 mg Intravenous Given 12/29/23 1658)  gadobutrol  (GADAVIST ) 1 MMOL/ML injection 10 mL (10 mLs Intravenous Contrast Given 12/29/23 1816)    ED Course/ Medical Decision Making/ A&P  Medical Decision Making Amount and/or Complexity of Data Reviewed Labs: ordered. Radiology: ordered.  Risk Prescription drug management.     Differential diagnosis includes but is not limited to vertigo, Mnire's, vestibular neuritis, intracranial tumor, electrolyte abnormality, arrhythmia  ED Course:  Upon initial evaluation, patient is well-appearing, stable vital signs aside from mildly elevated blood pressure of 154/76.  He is noted to have bigeminy on cardiac monitor, but denies any chest pain or shortness of breath.  Denies any known history of this. He will need to follow up with cardiology regarding this no other electrolyte abnormalities low concern that this is the cause of patient's symptoms at this time. He does not have any neurological deficits on exam.  Reporting a dizzy sensation without any double vision.  This is worse with movement. This seems more peripheral in nature. However, given this has been ongoing for a week without any improvement, this raises concern for intracranial abnormality. Had shared decision-making conversation with patient regarding try medication management with reevaluation, or proceeding with MRI for further evaluation.  Patient would like to obtain MRI for further evaluation.   Labs Ordered: I Ordered, and  personally interpreted labs.  The pertinent results include:   CBC with hemoglobin slightly low at 12.3, appears to be at baseline BMP with elevated creatinine at 1.6, this appears to be at baseline.  Also has elevated glucose at 161.  No other electrolyte abnormalities  Imaging Studies ordered: I ordered imaging studies including MRI brain with and without contrast, pending imaging   Cardiac Monitoring: / EKG: The patient was maintained on a cardiac monitor.  I personally viewed and interpreted the cardiac monitored which showed an underlying rhythm of: Bigeminy with no ST changes   Medications Given: Meclizine  for dizziness Zofran  for nausea  Upon re-evaluation about 30 minutes after getting meclizine  and Zofran , patient appears more comfortable and sitting in bed watching TV.  States he has not gotten up yet to test his dizziness. He is being brought to MRI now.     Impression: Dizziness Bigeminy on EKG  Disposition:  Care of this patient signed out to oncoming ED provider Neil Balls, PA-C to follow-up on MRI imaging.  He will need reevaluation after medications and will need to be walked to see if symptoms have improved. Disposition and treatment plan pending imaging results and clinical judgment of oncoming ED team.     This chart was dictated using voice recognition software, Dragon. Despite the best efforts of this provider to proofread and correct errors, errors may still occur which can change documentation meaning.          Final Clinical Impression(s) / ED Diagnoses Final diagnoses:  Bigeminy  Dizziness    Rx / DC Orders ED Discharge Orders          Ordered    Ambulatory referral to Cardiology       Comments: If you have not heard from the Cardiology office within the next 72 hours please call [phone].   12/29/23 1817              Rexie Catena, PA-C 12/29/23 1821    Nicklas Barns, MD 12/30/23 [phone]

## 2024-01-18 ENCOUNTER — Encounter: Payer: Self-pay | Admitting: Bariatrics

## 2024-01-18 ENCOUNTER — Ambulatory Visit (INDEPENDENT_AMBULATORY_CARE_PROVIDER_SITE_OTHER): Admitting: Bariatrics

## 2024-01-18 VITALS — BP 140/76 | HR 64 | Temp 97.9°F | Ht 71.0 in | Wt 355.0 lb

## 2024-01-18 DIAGNOSIS — E1165 Type 2 diabetes mellitus with hyperglycemia: Secondary | ICD-10-CM

## 2024-01-18 DIAGNOSIS — E559 Vitamin D deficiency, unspecified: Secondary | ICD-10-CM | POA: Diagnosis not present

## 2024-01-18 DIAGNOSIS — E119 Type 2 diabetes mellitus without complications: Secondary | ICD-10-CM

## 2024-01-18 DIAGNOSIS — Z6841 Body Mass Index (BMI) 40.0 and over, adult: Secondary | ICD-10-CM

## 2024-01-18 DIAGNOSIS — Z7985 Long-term (current) use of injectable non-insulin antidiabetic drugs: Secondary | ICD-10-CM

## 2024-01-18 MED ORDER — TIRZEPATIDE 15 MG/0.5ML ~~LOC~~ SOAJ: 15.0000 mg | SUBCUTANEOUS | 0 refills | Status: DC

## 2024-01-18 MED ORDER — VITAMIN D (ERGOCALCIFEROL) 1.25 MG (50000 UNIT) PO CAPS: 50000.0000 [IU] | ORAL_CAPSULE | ORAL | 0 refills | Status: DC

## 2024-01-18 NOTE — Progress Notes (Signed)
 WEIGHT SUMMARY AND BIOMETRICS  Weight Lost Since Last Visit: 3lb  Weight Gained Since Last Visit: 0   Vitals Temp: 97.9 F (36.6 C) BP: (!) 140/76 Pulse Rate: 64 SpO2: 100 %   Anthropometric Measurements Height: 5\' 11"  (1.803 m) Weight: (!) 355 lb (161 kg) BMI (Calculated): 49.53 Weight at Last Visit: 358lb Weight Lost Since Last Visit: 3lb Weight Gained Since Last Visit: 0 Starting Weight: 409lb Total Weight Loss (lbs): 54 lb (24.5 kg)   Body Composition  Body Fat %: 45.9 % Fat Mass (lbs): 163.2 lbs Muscle Mass (lbs): 182.8 lbs Total Body Water (lbs): 151 lbs Visceral Fat Rating : 34   Other Clinical Data Fasting: no Labs: no Today's Visit #: 28 Starting Date: 11/20/18    OBESITY Philip [name] is here to discuss his progress with his obesity treatment plan along with follow-up of his obesity related diagnoses.    Nutrition Plan: the Category 4 plan - 90% adherence.  Current exercise: none  Interim History:  He is down 3 lbs since his last visit.  He was recently seen at the hospital and diagnosed with dizziness/vertigo he does have an appointment with the ENT in the near future and also has an appointment with his cardiologist on this upcoming Friday. Eating all of the food on the plan., Protein intake is as prescribed, Is not skipping meals, Not journaling consistently., and Water intake is adequate.   Pharmacotherapy: Philip [name] is on Mounjaro  15 mg SQ weekly Adverse side effects: None Hunger is moderately controlled.  Cravings are moderately controlled.  Assessment/Plan:    Vitamin D  Deficiency Vitamin D  is not at goal of 50.  Most recent vitamin D  level was 40.6. He is on  prescription ergocalciferol  50,000 IU weekly. Lab Results  Component Value Date   VD25OH 40.6 06/27/2023   VD25OH 37.2 02/23/2023   VD25OH 34.5 11/16/2022     Plan: Refill prescription vitamin D  50,000 IU weekly.  He will get some light sun exposure.   Type II Diabetes HgbA1c is not at goal. Last A1c was 9.0 CBGs: Not checking      Episodes of hypoglycemia: no Medication(s): Mounjaro  15 mg SQ weekly  Lab Results  Component Value Date   HGBA1C 9.0 (H) 06/27/2023   HGBA1C 9.9 (H) 02/23/2023   HGBA1C 10.3 (H) 11/16/2022   Lab Results  Component Value Date   LDLCALC 124 (H) 06/27/2023   CREATININE 1.60 (H) 12/29/2023   Lab Results  Component Value Date   GFR 85.57 12/29/2020   GFR 85.61 12/03/2020    Plan: Continue and refill Mounjaro  15 mg SQ weekly Continue all other medications.  Will keep all carbohydrates low both sweets and starches.  Will continue activities as tolerated trying to get in some resistance and some cardio.  Will also continue his yard work and other activities as tolerated.  He will be  careful at this time and does not till he sees his cardiologist secondary to dizziness.  Aim for 7 to 9 hours of sleep nightly.  Eat more low glycemic index foods.     Morbid Obesity: Current BMI BMI (Calculated): 49.53   Pharmacotherapy Plan Continue and refill  Mounjaro  15 mg SQ weekly  Philip [name] is currently in the action stage of change. As such, his goal is to continue with weight loss efforts.  He has agreed to the Category 4 plan.He will minimize carbohydrates.   Exercise goals: All adults should avoid inactivity. Some physical activity is better than none, and adults who participate in any amount of physical activity gain some health benefits.  As stated earlier in the note he will minimize any exhaustive exercise until after he has been evaluated and cleared per cardiology and ENT.  Behavioral modification strategies: increasing lean protein intake, no meal skipping, meal planning , increase water intake, better snacking choices, and planning for success.  Philip [name] has agreed to follow-up with our clinic in 4 weeks.     Objective:   VITALS: Per patient if applicable, see vitals. GENERAL: Alert and in no acute distress. CARDIOPULMONARY: No increased WOB. Speaking in clear sentences.  PSYCH: Pleasant and cooperative. Speech normal rate and rhythm. Affect is appropriate. Insight and judgement are appropriate. Attention is focused, linear, and appropriate.  NEURO: Oriented as arrived to appointment on time with no prompting.   Attestation Statements:   This was prepared with the assistance of Engineer, civil (consulting).  Occasional wrong-word or sound-a-like substitutions may have occurred due to the inherent limitations of voice recognition   Philip Peper, DO

## 2024-01-18 NOTE — Progress Notes (Signed)
 Cardiology Clinic Note   Patient Name: Philip [name] Date of Encounter: 01/20/2024  Primary Care Provider:  Dudley Ghee, MD Primary Cardiologist:  None  Patient Profile    Philip [name] 58 year old male presents to clinic today for follow-up evaluation of his sinus rhythm with frequent PVCs  Past Medical History    Past Medical History:  Diagnosis Date   Diabetes mellitus without complication ([name])    Fluid retention    High cholesterol    Hypertension    Sleep apnea    SOB (shortness of breath)    Past Surgical History:  Procedure Laterality Date   CARPAL TUNNEL RELEASE     FOOT SURGERY     KNEE SURGERY      Allergies  Allergies  Allergen Reactions   Metformin Diarrhea   Codeine     History of Present Illness    Philip [name] has PMH of HTN, type 2 diabetes, morbid obesity, severe OSA, obesity hypoventilation syndrome and PACs/PVCs.  He was seen by Dr. Rolm Clos 08/03/2021.  During that time he was was being seen in follow-up for shortness of breath.  He had a normal echocardiogram.  His PFTs showed a restrictive pattern.  Overall he was doing well.  His blood pressure was better controlled at home.  It was slightly elevated in the office.  He denied significant chest pain.  He did note shortness of breath with increased physical activity.  He was awaiting pulmonary rehab evaluation.  He was referred to healthy weight and wellness to discuss bariatric surgery.  His lower extremity swelling was controlled with furosemide .  His weight was stable.  He had started to work on his diet and was trying to increase his physical activity.  His echocardiogram was reviewed.  It was felt that his lower extremity swelling could be attributed to venous insufficiency.  He was instructed to increase his physical activity.  His furosemide  was continued.  He was seen and evaluated in the emergency department for dizziness.  He was noted to have frequent PVCs on his EKG.  He received  Zofran  and meclizine .  He underwent MRI brain 12/29/2023.  It showed no visible acute abnormalities.  His symptoms improved.  He was referred to cardiology for follow-up evaluation.  He presents to the clinic today and states he has been noticing dizziness for several weeks.  We reviewed his recent hospital visit.  He reports that he tried Epley type maneuvers without success.  We reviewed his PVCs.  His pulse today is noted to be 56.  His blood pressure is well-controlled at 128/72.  He does have referral in place for ear nose and throat.  He also follows with pulmonology for restrictive lung disease.  He has follow-up with pulmonology planned for 1-2 months.  His EKG showed bigeminy pattern.  He does occasionally note palpitations.  Due to his pulse I am unable to increase atenolol.  I will refer to EP for further evaluation and prognostication.  He notes that he is sedentary due to his nausea and dizziness.  I will give him chair exercises to do.  He does follow with weight loss clinic/Dr. Bevin Bucks.  We will plan follow-up with general cardiology in 9-12 months..  Today he denies  lower extremity edema, fatigue, palpitations, melena, hematuria, hemoptysis, diaphoresis, weakness, presyncope, syncope, orthopnea, and PND.    Home Medications    Prior to Admission medications   Medication Sig Start Date End Date Taking? Authorizing Provider  albuterol  (VENTOLIN  HFA) 108 (90 Base) MCG/ACT inhaler Inhale 2 puffs into the lungs every 4 (four) hours as needed for wheezing or shortness of breath. 06/08/23   Wilfredo Hanly, MD  amLODipine  (NORVASC ) 10 MG tablet Take 1 tablet (10 mg total) by mouth daily. 12/20/23   Kirk Peper A, DO  atenolol (TENORMIN) 50 MG tablet Take 50 mg by mouth daily. 11/12/20   [provider]  atorvastatin (LIPITOR) 40 MG tablet Take 40 mg by mouth daily. 03/20/22   [provider]  FARXIGA 10 MG TABS tablet Take 10 mg by mouth daily. 11/12/20   [provider]   fenofibrate  (TRICOR ) 145 MG tablet Take 1 tablet (145 mg total) by mouth daily. 12/20/23   Kirk Peper A, DO  fluticasone -salmeterol (WIXELA INHUB) 250-50 MCG/ACT AEPB Inhale 1 puff into the lungs in the morning and at bedtime. 06/08/23   Wilfredo Hanly, MD  loratadine (CLARITIN) 10 MG tablet Take 10 mg by mouth daily.    [provider]  losartan -hydrochlorothiazide (HYZAAR) 100-25 MG tablet Take 1 tablet by mouth daily. 12/20/23   Kirk Peper A, DO  meclizine  (ANTIVERT ) 25 MG tablet Take 1 tablet (25 mg total) by mouth 3 (three) times daily as needed for dizziness. 12/29/23   Marrowbone Butter, PA  oxymetazoline  (AFRIN NASAL SPRAY) 0.05 % nasal spray Place 1 spray into both nostrils 2 (two) times daily. 07/01/22   Ninetta Basket, MD  pioglitazone (ACTOS) 45 MG tablet Take 45 mg by mouth daily. 01/15/21   [provider]  tadalafil (CIALIS) 20 MG tablet Take 20 mg by mouth daily. 12/06/23   [provider]  Tiotropium Bromide Monohydrate  (SPIRIVA  RESPIMAT) 2.5 MCG/ACT AERS Inhale 2 puffs into the lungs daily. 06/08/23   Wilfredo Hanly, MD  tirzepatide  (MOUNJARO ) 15 MG/0.5ML Pen Inject 15 mg into the skin once a week. 01/18/24   Kirk Peper A, DO  Vitamin D , Ergocalciferol , (DRISDOL ) 1.25 MG (50000 UNIT) CAPS capsule Take 1 capsule (50,000 Units total) by mouth every 14 (fourteen) days. 01/18/24   Lorayne Rocks, DO    Family History    Family History  Problem Relation Age of Onset   Heart disease Mother    Diabetes Mother    High Cholesterol Mother    Thyroid disease Mother    Obesity Mother    Heart disease Father    Diabetes Father    Stroke Father    Cancer Father    Obesity Father    He indicated that his mother is deceased. He indicated that his father is deceased.  Social History    Social History   Socioeconomic History   Marital status: Married    Spouse name: Not on file   Number of children: 1   Years of education: Not on file   Highest  education level: GED or equivalent  Occupational History   Occupation: Disabled  Tobacco Use   Smoking status: Never   Smokeless tobacco: Never  Substance and Sexual Activity   Alcohol use: No   Drug use: No   Sexual activity: Not on file  Other Topics Concern   Not on file  Social History Narrative   Works at [employer] and [employer]   Social Drivers of Corporate investment banker Strain: Not on file  Food Insecurity: No Food Insecurity (05/01/2021)   Hunger Vital Sign    Worried About Running Out of Food in the Last Year: Never true  Ran Out of Food in the Last Year: Never true  Transportation Needs: No Transportation Needs (05/01/2021)   PRAPARE - Administrator, Civil Service (Medical): No    Lack of Transportation (Non-Medical): No  Physical Activity: Not on file  Stress: Not on file  Social Connections: Not on file  Intimate Partner Violence: Not on file     Review of Systems    General:  No chills, fever, night sweats or weight changes.  Cardiovascular:  No chest pain, dyspnea on exertion, edema, orthopnea, palpitations, paroxysmal nocturnal dyspnea. Dermatological: No rash, lesions/masses Respiratory: No cough, dyspnea Urologic: No hematuria, dysuria Abdominal:   No nausea, vomiting, diarrhea, bright red blood per rectum, melena, or hematemesis Neurologic:  No visual changes, wkns, changes in mental status. All other systems reviewed and are otherwise negative except as noted above.  Physical Exam    VS:  BP 128/72 (BP Location: Left Arm, Patient Position: Sitting, Cuff Size: Large)   Pulse (!) 56   Ht 6\' 1"  (1.854 m)   Wt (!) 361 lb (163.7 kg)   SpO2 97%   BMI 47.63 kg/m  , BMI Body mass index is 47.63 kg/m. GEN: Well nourished, well developed, in no acute distress. HEENT: normal. Neck: Supple, no JVD, carotid bruits, or masses. Cardiac: RRR, no murmurs, rubs, or gallops. No clubbing, cyanosis, edema.  Radials/DP/PT 2+ and equal bilaterally.   Respiratory:  Respirations regular and unlabored, clear to auscultation bilaterally. GI: Soft, nontender, nondistended, BS + x 4. MS: no deformity or atrophy. Skin: warm and dry, no rash. Neuro:  Strength and sensation are intact. Psych: Normal affect.  Accessory Clinical Findings    Recent Labs: 06/08/2023: Pro B Natriuretic peptide (BNP) 34.0 06/27/2023: ALT 17 11/30/2023: TSH 3.17 12/29/2023: BUN 29; Creatinine, Ser 1.60; Hemoglobin 12.3; Platelets 361; Potassium 4.0; Sodium 138   Recent Lipid Panel    Component Value Date/Time   CHOL 201 (H) 06/27/2023 0757   TRIG 231 (H) 06/27/2023 0757   HDL 36 (L) 06/27/2023 0757   LDLCALC 124 (H) 06/27/2023 0757         ECG personally reviewed by me today-none today.     Echocardiogram 05/19/2021  IMPRESSIONS     1. Left ventricular ejection fraction, by estimation, is 55 to 60%. The  left ventricle has normal function. The left ventricle has no regional  wall motion abnormalities. The left ventricular internal cavity size was  mildly dilated. Left ventricular  diastolic parameters were normal.   2. Right ventricular systolic function is normal. The right ventricular  size is normal. Tricuspid regurgitation signal is inadequate for assessing  PA pressure.   3. The mitral valve is grossly normal. No evidence of mitral valve  regurgitation.   4. The aortic valve is tricuspid. Aortic valve regurgitation is not  visualized. Mild aortic valve sclerosis is present, with no evidence of  aortic valve stenosis.   Comparison(s): No prior Echocardiogram.   FINDINGS   Left Ventricle: Left ventricular ejection fraction, by estimation, is 55  to 60%. The left ventricle has normal function. The left ventricle has no  regional wall motion abnormalities. The left ventricular internal cavity  size was mildly dilated. There is   no left ventricular hypertrophy. Left ventricular diastolic parameters  were normal.   Right Ventricle: The  right ventricular size is normal. No increase in  right ventricular wall thickness. Right ventricular systolic function is  normal. Tricuspid regurgitation signal is inadequate for assessing PA  pressure.  Left Atrium: Left atrial size was normal in size.   Right Atrium: Right atrial size was normal in size.   Pericardium: There is no evidence of pericardial effusion.   Mitral Valve: The mitral valve is grossly normal. No evidence of mitral  valve regurgitation.   Tricuspid Valve: The tricuspid valve is not well visualized. Tricuspid  valve regurgitation is not demonstrated.   Aortic Valve: The aortic valve is tricuspid. Aortic valve regurgitation is  not visualized. Mild aortic valve sclerosis is present, with no evidence  of aortic valve stenosis.   Pulmonic Valve: The pulmonic valve was grossly normal. Pulmonic valve  regurgitation is not visualized.   Aorta: The aortic root and ascending aorta are structurally normal, with  no evidence of dilitation.   IAS/Shunts: No atrial level shunt detected by color flow Doppler.       Assessment & Plan   1.  PVCs-HR today 56 bpm.  Echocardiogram 9/22 showed normal LVEF.  Denies increased work of breathing or activity intolerance. Continue atenolol. Maintain p.o. hydration Refer to EP for consideration of PVC ablation and further prognostication. Avoid triggers caffeine, chocolate, EtOH, dehydration etc.  Essential hypertension-BP today 128/72. Maintain pressure log Heart healthy low-sodium diet Continue atenolol, amlodipine , losartan , hydrochlorothiazide  Hyperlipidemia-LDL 124 on 06/27/23. High-fiber diet Continue atorvastatin Increase physical activity as tolerated  OSA-reports compliance with CPAP.  Continue CPAP use Continue weight loss Increase physical activity as tolerated  Morbid obesity-weight today 361lbs. Continue Mounjaro  Reduce calorie diet Follows with wt loss Dr. Bevin Bucks Chair exercises  EOD  Disposition: Follow-up with Dr. Rolm Clos or me in 9-12 months.   Chet Cota. Tayton Decaire NP-C     01/20/2024, 10:57 AM [facility] [address] Office [phone] Fax [phone]    I spent 14 minutes examining this patient, reviewing medications, and using patient centered shared decision making involving their cardiac care.   I spent  20 minutes reviewing past medical history,  medications, and prior cardiac tests.

## 2024-01-20 ENCOUNTER — Ambulatory Visit: Attending: General Practice | Admitting: General Practice

## 2024-01-20 ENCOUNTER — Encounter: Payer: Self-pay | Admitting: General Practice

## 2024-01-20 VITALS — BP 128/72 | HR 56 | Ht 73.0 in | Wt 361.0 lb

## 2024-01-20 DIAGNOSIS — G4733 Obstructive sleep apnea (adult) (pediatric): Secondary | ICD-10-CM | POA: Insufficient documentation

## 2024-01-20 DIAGNOSIS — E1169 Type 2 diabetes mellitus with other specified complication: Secondary | ICD-10-CM | POA: Insufficient documentation

## 2024-01-20 DIAGNOSIS — I493 Ventricular premature depolarization: Secondary | ICD-10-CM | POA: Insufficient documentation

## 2024-01-20 DIAGNOSIS — E785 Hyperlipidemia, unspecified: Secondary | ICD-10-CM | POA: Insufficient documentation

## 2024-01-20 DIAGNOSIS — I10 Essential (primary) hypertension: Secondary | ICD-10-CM | POA: Insufficient documentation

## 2024-01-20 NOTE — Patient Instructions (Addendum)
 Medication Instructions:  Your physician recommends that you continue on your current medications as directed. Please refer to the Current Medication list given to you today.  *If you need a refill on your cardiac medications before your next appointment, please call your pharmacy*  Lab Work: NONE If you have labs (blood work) drawn today and your tests are completely normal, you will receive your results only by: MyChart Message (if you have MyChart) OR A paper copy in the mail If you have any lab test that is abnormal or we need to change your treatment, we will call you to review the results.  Testing/Procedures: NONE  Follow-Up: At [facility], you and your health needs are our priority.  As part of our continuing mission to provide you with exceptional heart care, our providers are all part of one team.  This team includes your primary Cardiologist (physician) and Advanced Practice Providers or APPs (Physician Assistants and Nurse Practitioners) who all work together to provide you with the care you need, when you need it.  Your next appointment:   9-12 month(s)  Provider:   One of our Advanced Practice Providers (APPs): Melita Springer, PA-C  Friddie Jetty, NP Evaline Hill, NP  Theotis Flake, PA-C Lawana Pray, NP  Willis Harter, PA-C Lovette Rud, PA-C  Hao Meng, PA-C Ernest Dick, NP  Marlana Silvan, NP Marcie Sever, PA-C  Laquita Plant, PA-C    Dayna Dunn, PA-C  Scott Weaver, PA-C Palmer Bobo, NP Katlyn West, NP Callie Goodrich, PA-C  Evan Williams, PA-C Sheng Haley, PA-C  Xika Zhao, NP Kathleen Johnson, PA-C   We recommend signing up for the patient portal called "MyChart".  Sign up information is provided on this After Visit Summary.  MyChart is used to connect with patients for Virtual Visits (Telemedicine).  Patients are able to view lab/test results, encounter notes, upcoming appointments, etc.  Non-urgent messages can be sent to your provider as well.    To learn more about what you can do with MyChart, go to [email or web address].   Other Instructions YOU HAVE BEEN REFERRED TO SEE AN ELECTROPHYSIOLOGIST    YOUR PROVIDER RECOMMENDS THAT YOU DO CHAIR EXERCISES 10 MINUTES DAILY.  TRY TO AVOID A LOT OF CAFFEINE INTAKE, SLEEP DISTURBANCES, STRESS, AND DEHYDRATIONS

## 2024-02-06 NOTE — Progress Notes (Signed)
 " Electrophysiology Office Note:    Date:  02/07/2024   ID:  Philip [name], DOB 1965/09/30, MRN [id number]  PCP:  Henry Ingle, MD   [facility] Providers Cardiologist:  None Electrophysiologist:  Eulas FORBES Furbish, MD     Referring MD: Henry Ingle, MD   History of Present Illness:    Philip [name] is a 58 y.o. male with a medical history significant for PVCs, diabetes, hypertension, hyperlipidemia, sleep apnea and morbid obesity referred for arrhythmia management.     He was seen in the ER for dizziness in April 2025.  He was treated with Zofran  and meclizine .  Brain MRI did not show any significant abnormalities to explain his symptoms.  He has noted to have frequent PVCs on his EKG.  He was referred to ENT.  Discussed the use of AI scribe software for clinical note transcription with the patient, who gave verbal consent to proceed.  History of Present Illness Philip [name] is a 58 year old male who presents with palpitations, dizziness, and nausea.  He has been experiencing dizziness, nausea, and palpitations. He visited the ER for the symptoms in April. During that visit, premature ventricular contractions (PVCs) were noted on his EKG. The palpitations are described as a constant sensation of his heart 'jumping', sometimes accompanied by cold sweats and nausea. The symptoms are sometimes more evident but never completely resolve.  He experiences persistent dizziness and a sensation of ear fullness with ringing, similar to the feeling of ear pressure changes when ascending mountains. This has been ongoing for the same duration as the palpitations. He experiences vertigo-like symptoms, particularly when standing up quickly, which causes his eyes to feel like they are moving and the room to spin. He has an upcoming appointment with an ENT specialist to address these symptoms.  He is currently taking atenolol for his symptoms. He reports drinking plenty of water, especially  as part of his weight loss efforts. He is in the process of losing weight and is seeing a weight loss doctor.  His family history is significant for heart attacks in both parents and a brother, which contributes to his concern about his current symptoms. He also mentions having lung damage from COVID-19, which he contracted twice since 2022.  During the review of symptoms, he confirms experiencing palpitations without dizziness at times, and dizziness without palpitations at other times. He also notes that the dizziness and room spinning are more bothersome than the palpitations.         Today, he reports that he is at baseline.  He is having palpitations currently but no significant dizziness or nausea.  EKGs/Labs/Other Studies Reviewed Today:     Echocardiogram:  TTE September 2022 EF 55 to 60%.  Mildly dilated left ventricle.  Normal RV function.  Normal valvular structure and function.   Monitors:  3 day monitor (ordered)  -- my interpretation Results pending    EKG:   EKG Interpretation Date/Time:  Tuesday Feb 07 2024 09:42:42 EDT Ventricular Rate:  68 PR Interval:  186 QRS Duration:  100 QT Interval:  408 QTC Calculation: 433 R Axis:   -48  Text Interpretation: Sinus rhythm with frequent Premature ventricular complexes Left axis deviation When compared with ECG of 29-Dec-2023 16:00, Minimal criteria for Anterior infarct are no longer Present Confirmed by Furbish Eulas [phone]) on 02/07/2024 9:45:06 AM     Physical Exam:    VS:  BP 136/80 (BP Location: Right Arm, Patient Position: Sitting, Cuff  Size: Large)   Pulse 68   Ht 6' 1 (1.854 m)   Wt (!) 359 lb (162.8 kg)   SpO2 98%   BMI 47.36 kg/m     Wt Readings from Last 3 Encounters:  02/07/24 (!) 359 lb (162.8 kg)  01/20/24 (!) 361 lb (163.7 kg)  01/18/24 (!) 355 lb (161 kg)     GEN: Well nourished, well developed in no acute distress CARDIAC: RRR, no murmurs, rubs, gallops RESPIRATORY:  Normal work of  breathing MUSCULOSKELETAL: no edema    ASSESSMENT & PLAN:     PVCs Will order monitor to quantify burden EF is normal Has not had much benefit from atenolol PVCs appear to be arising from the left ventricular outflow tract We will place a monitor today to quantify PVC burden Will try propranolol  instead of atenolol Will try magnesium  taurate  Essential hypertension BP acceptable today  Obstructive sleep apnea Compliance with CPAP is important  Morbid obesity He is currently working on weight loss   Signed, Eulas FORBES Furbish, MD  02/07/2024 10:01 AM    [facility] "

## 2024-02-07 ENCOUNTER — Ambulatory Visit (INDEPENDENT_AMBULATORY_CARE_PROVIDER_SITE_OTHER)

## 2024-02-07 ENCOUNTER — Ambulatory Visit: Attending: Cardiovascular Disease | Admitting: Cardiovascular Disease

## 2024-02-07 ENCOUNTER — Encounter: Payer: Self-pay | Admitting: Cardiovascular Disease

## 2024-02-07 VITALS — BP 136/80 | HR 68 | Ht 73.0 in | Wt 359.0 lb

## 2024-02-07 DIAGNOSIS — I493 Ventricular premature depolarization: Secondary | ICD-10-CM | POA: Insufficient documentation

## 2024-02-07 MED ORDER — PROPRANOLOL HCL ER 80 MG PO CP24: 80.0000 mg | ORAL_CAPSULE | Freq: Every day | ORAL | 3 refills | Status: AC

## 2024-02-07 MED ORDER — MAGNESIUM 400 MG PO TABS: 400.0000 mg | ORAL_TABLET | Freq: Every day | ORAL | 3 refills | Status: AC

## 2024-02-07 NOTE — Progress Notes (Unsigned)
 Enrolled for [employer] to mail a ZIO XT long term holter monitor to the patients address on file.

## 2024-02-07 NOTE — Patient Instructions (Signed)
 Medication Instructions:  STOP Atenolol START Propranolol ER 80 mg once daily  START Magnesium TAURATE 400 mg once daily - this can be difficult to find locally but can be purchased on [email or web address] or [email or web address] *If you need a refill on your cardiac medications before your next appointment, please call your pharmacy*   Testing/Procedures: Zio Cardiac Monitor - this will come to you via USPS - please wear for 3 days then return Your physician has recommended that you wear an event monitor. Event monitors are medical devices that record the heart's electrical activity. Doctors most often us  these monitors to diagnose arrhythmias. Arrhythmias are problems with the speed or rhythm of the heartbeat. The monitor is a small, portable device. You can wear one while you do your normal daily activities. This is usually used to diagnose what is causing palpitations/syncope (passing out).   Follow-Up: At [facility], you and your health needs are our priority.  As part of our continuing mission to provide you with exceptional heart care, our providers are all part of one team.  This team includes your primary Cardiologist (physician) and Advanced Practice Providers or APPs (Physician Assistants and Nurse Practitioners) who all work together to provide you with the care you need, when you need it.  Your next appointment:   4-6 week(s)  Provider:   Marlane Silver, MD    Other Instructions Delane Fear- Long Term Monitor Instructions  Your physician has requested you wear a ZIO patch monitor for 3 days.  This is a single patch monitor. [employer] supplies one patch monitor per enrollment. Additional stickers are not available. Please do not apply patch if you will be having a Nuclear Stress Test,  Echocardiogram, Cardiac CT, MRI, or Chest Xray during the period you would be wearing the  monitor. The patch cannot be worn during these tests. You cannot remove and re-apply the  ZIO XT patch monitor.   Your ZIO patch monitor will be mailed 3 day USPS to your address on file. It may take 3-5 days  to receive your monitor after you have been enrolled.  Once you have received your monitor, please review the enclosed instructions. Your monitor  has already been registered assigning a specific monitor serial # to you.  Billing and Patient Assistance Program Information  We have supplied [employer] with any of your insurance information on file for billing purposes. [employer] offers a sliding scale Patient Assistance Program for patients that do not have  insurance, or whose insurance does not completely cover the cost of the ZIO monitor.  You must apply for the Patient Assistance Program to qualify for this discounted rate.  To apply, please call [employer] at [phone], select option 4, select option 2, ask to apply for  Patient Assistance Program. Sanna Crystal will ask your household income, and how many people  are in your household. They will quote your out-of-pocket cost based on that information.  [employer] will also be able to set up a 54-month, interest-free payment plan if needed.  Applying the monitor   Shave hair from upper left chest.  Hold abrader disc by orange tab. Rub abrader in 40 strokes over the upper left chest as  indicated in your monitor instructions.  Clean area with 4 enclosed alcohol pads. Let dry.  Apply patch as indicated in monitor instructions. Patch will be placed under collarbone on left  side of chest with arrow pointing upward.  Rub patch adhesive wings for 2 minutes. Remove white label marked "1".  Remove the white  label marked "2". Rub patch adhesive wings for 2 additional minutes.  While looking in a mirror, press and release button in center of patch. A small green light will  flash 3-4 times. This will be your only indicator that the monitor has been turned on.  Do not shower for the first 24 hours. You may shower after the first 24 hours.  Press the button if  you feel a symptom. You will hear a small click. Record Date, Time and  Symptom in the Patient Logbook.  When you are ready to remove the patch, follow instructions on the last 2 pages of Patient  Logbook. Stick patch monitor onto the last page of Patient Logbook.  Place Patient Logbook in the blue and white box. Use locking tab on box and tape box closed  securely. The blue and white box has prepaid postage on it. Please place it in the mailbox as  soon as possible. Your physician should have your test results approximately 7 days after the  monitor has been mailed back to [facility].  Call [facility] Customer Care at [phone] if you have questions regarding  your ZIO XT patch monitor. Call them immediately if you see an orange light blinking on your  monitor.  If your monitor falls off in less than 4 days, contact our Monitor department at [phone].  If your monitor becomes loose or falls off after 4 days call [employer] at [phone] for  suggestions on securing your monitor

## 2024-02-22 ENCOUNTER — Encounter: Payer: Self-pay | Admitting: Bariatrics

## 2024-02-22 ENCOUNTER — Ambulatory Visit (INDEPENDENT_AMBULATORY_CARE_PROVIDER_SITE_OTHER): Admitting: Bariatrics

## 2024-02-22 VITALS — BP 143/68 | HR 67 | Temp 97.8°F | Ht 71.0 in | Wt 355.0 lb

## 2024-02-22 DIAGNOSIS — E781 Pure hyperglyceridemia: Secondary | ICD-10-CM

## 2024-02-22 DIAGNOSIS — Z7985 Long-term (current) use of injectable non-insulin antidiabetic drugs: Secondary | ICD-10-CM

## 2024-02-22 DIAGNOSIS — I10 Essential (primary) hypertension: Secondary | ICD-10-CM

## 2024-02-22 DIAGNOSIS — Z6841 Body Mass Index (BMI) 40.0 and over, adult: Secondary | ICD-10-CM

## 2024-02-22 DIAGNOSIS — E559 Vitamin D deficiency, unspecified: Secondary | ICD-10-CM

## 2024-02-22 DIAGNOSIS — E119 Type 2 diabetes mellitus without complications: Secondary | ICD-10-CM | POA: Diagnosis not present

## 2024-02-22 DIAGNOSIS — E1165 Type 2 diabetes mellitus with hyperglycemia: Secondary | ICD-10-CM

## 2024-02-22 MED ORDER — VITAMIN D (ERGOCALCIFEROL) 1.25 MG (50000 UNIT) PO CAPS: 50000.0000 [IU] | ORAL_CAPSULE | ORAL | 0 refills | Status: DC

## 2024-02-22 MED ORDER — LOSARTAN POTASSIUM-HCTZ 100-25 MG PO TABS: 1.0000 | ORAL_TABLET | Freq: Every day | ORAL | 0 refills | Status: DC

## 2024-02-22 MED ORDER — TIRZEPATIDE 15 MG/0.5ML ~~LOC~~ SOAJ: 15.0000 mg | SUBCUTANEOUS | 0 refills | Status: DC

## 2024-02-22 MED ORDER — AMLODIPINE BESYLATE 10 MG PO TABS: 10.0000 mg | ORAL_TABLET | Freq: Every day | ORAL | 0 refills | Status: DC

## 2024-02-22 MED ORDER — FENOFIBRATE 145 MG PO TABS: 145.0000 mg | ORAL_TABLET | Freq: Every day | ORAL | 0 refills | Status: DC

## 2024-02-22 NOTE — Progress Notes (Signed)
 WEIGHT SUMMARY AND BIOMETRICS  Weight Lost Since Last Visit: 0  Weight Gained Since Last Visit: 0   Vitals Temp: 97.8 F (36.6 C) BP: (!) 143/68 Pulse Rate: 67 SpO2: 100 %   Anthropometric Measurements Height: 5' 11 (1.803 m) Weight: (!) 355 lb (161 kg) BMI (Calculated): 49.53 Weight at Last Visit: 355lb Weight Lost Since Last Visit: 0 Weight Gained Since Last Visit: 0 Starting Weight: 409lb Total Weight Loss (lbs): 54 lb (24.5 kg)   Body Composition  Body Fat %: 46.7 % Fat Mass (lbs): 166 lbs Muscle Mass (lbs): 180.4 lbs Visceral Fat Rating : 34   Other Clinical Data Fasting: yes Labs: no Today's Visit #: 29 Starting Date: 11/20/18    OBESITY Gabriela is here to discuss his progress with his obesity treatment plan along with follow-up of his obesity related diagnoses.    Nutrition Plan: the Category 4 plan - 80% adherence.  Current exercise: walking  Interim History:  His weight is the same weight.  Eating all of the food on the plan., Protein intake is as prescribed, Is not skipping meals, Water intake is adequate., and Denies polyphagia   Pharmacotherapy: Phenix is on Mounjaro  15 mg SQ weekly Adverse side effects: None Hunger is moderately controlled.  Cravings are moderately controlled.  Assessment/Plan:   Vitamin D  Deficiency Vitamin D  is not at goal of 50.  Most recent vitamin D  level was 40.6. He is on  prescription ergocalciferol  50,000 IU weekly. Lab Results  Component Value Date   VD25OH 40.6 06/27/2023   VD25OH 37.2 02/23/2023   VD25OH 34.5 11/16/2022    Plan: Refill prescription vitamin D  50,000 IU weekly.   Type II Diabetes HgbA1c is not at goal. Last A1c was 9.0 CBGs: Not checking      Episodes of hypoglycemia: no Medication(s): Mounjaro  15 mg SQ weekly   Lab Results  Component Value Date   HGBA1C 9.0 (H)  06/27/2023   HGBA1C 9.9 (H) 02/23/2023   HGBA1C 10.3 (H) 11/16/2022   Lab Results  Component Value Date   LDLCALC 124 (H) 06/27/2023   CREATININE 1.60 (H) 12/29/2023   Lab Results  Component Value Date   GFR 85.57 12/29/2020   GFR 85.61 12/03/2020    Plan: Continue and refill Mounjaro  15 mg SQ weekly Continue all other medications.  Will keep all carbohydrates low both sweets and starches.  Will continue exercise regimen to 30 to 60 minutes on most days of the week.  Aim for 7 to 9 hours of sleep nightly.  Eat more low glycemic index foods.   Hypertriglyceridemia:  LDL is not at goal. Medication(s): Lipitor and Tricor   Cardiovascular risk factors: advanced age (older than 35 for men, 62 for women), diabetes mellitus, dyslipidemia, hypertension, male gender, obesity (BMI >= 30 kg/m2), and sedentary lifestyle  Lab Results  Component Value Date   CHOL  201 (H) 06/27/2023   HDL 36 (L) 06/27/2023   LDLCALC 124 (H) 06/27/2023   TRIG 231 (H) 06/27/2023   Lab Results  Component Value Date   ALT 17 06/27/2023   AST 19 06/27/2023   ALKPHOS 65 06/27/2023   BILITOT 0.5 06/27/2023   The 10-year ASCVD risk score (Arnett DK, et al., 2019) is: 28.2%   Values used to calculate the score:     Age: 58 years     Sex: Male     Is Non-Hispanic African American: Yes     Diabetic: Yes     Tobacco smoker: No     Systolic Blood Pressure: 143 mmHg     Is BP treated: Yes     HDL Cholesterol: 36 mg/dL     Total Cholesterol: 201 mg/dL  Plan:  Continue statin.  Information sheet on healthy vs unhealthy fats.  Will avoid all trans fats.  Will read labels Will minimize saturated fats except the following: low fat meats in moderation, diary, and limited dark chocolate.  He will minimize his carbohydrates.     Hypertension Hypertension improved.  Medication(s): Amlodipine  10 mg 1 daily  and Hyzaar 100/25 mg daily  BP Readings from Last 3 Encounters:  02/22/24 (!) 143/68  02/07/24  136/80  01/20/24 128/72   Lab Results  Component Value Date   CREATININE 1.60 (H) 12/29/2023   CREATININE 1.67 (H) 06/27/2023   CREATININE 1.38 (H) 02/23/2023   Lab Results  Component Value Date   GFR 85.57 12/29/2020   GFR 85.61 12/03/2020    Plan: Continue all antihypertensives at current dosages. No added salt. Will keep sodium content to 1,500 mg or less per day.     Morbid Obesity: Current BMI BMI (Calculated): 49.53   Pharmacotherapy Plan Continue and refill  Mounjaro  15 mg SQ weekly  Danh is currently in the action stage of change. As such, his goal is to continue with weight loss efforts.  He has agreed to the Category 4 plan.  Exercise goals: All adults should avoid inactivity. Some physical activity is better than none, and adults who participate in any amount of physical activity gain some health benefits.  Behavioral modification strategies: increasing lean protein intake, no meal skipping, avoiding temptations, travel eating strategies, keep a strict food journal, weigh protein portions, measure portion sizes, and mindful eating.  Ausar has agreed to follow-up with our clinic in 4 weeks.      Objective:   VITALS: Per patient if applicable, see vitals. GENERAL: Alert and in no acute distress. CARDIOPULMONARY: No increased WOB. Speaking in clear sentences.  PSYCH: Pleasant and cooperative. Speech normal rate and rhythm. Affect is appropriate. Insight and judgement are appropriate. Attention is focused, linear, and appropriate.  NEURO: Oriented as arrived to appointment on time with no prompting.   Attestation Statements:   This was prepared with the assistance of Engineer, civil (consulting).  Occasional wrong-word or sound-a-like substitutions may have occurred due to the inherent limitations of voice recognition   Kirk Peper, DO

## 2024-03-05 ENCOUNTER — Telehealth: Payer: Self-pay

## 2024-03-05 NOTE — Telephone Encounter (Signed)
 Started PA for Mounjaro  15mg 

## 2024-03-06 NOTE — Telephone Encounter (Signed)
 Mounjaro  approved through 03/05/2025

## 2024-03-07 ENCOUNTER — Other Ambulatory Visit: Payer: Self-pay

## 2024-03-07 ENCOUNTER — Encounter: Payer: Self-pay | Admitting: Physical Therapy

## 2024-03-07 ENCOUNTER — Ambulatory Visit: Attending: Physician Assistant | Admitting: Physical Therapy

## 2024-03-07 DIAGNOSIS — R42 Dizziness and giddiness: Secondary | ICD-10-CM | POA: Insufficient documentation

## 2024-03-07 DIAGNOSIS — R2681 Unsteadiness on feet: Secondary | ICD-10-CM | POA: Diagnosis present

## 2024-03-07 NOTE — Therapy (Signed)
 OUTPATIENT PHYSICAL THERAPY VESTIBULAR EVALUATION     Patient Name: Philip [name] MRN: [id number] DOB:Nov 17, 1965, 58 y.o., male Today's Date: 03/08/2024  END OF SESSION:  PT End of Session - 03/08/24 0814     Visit Number 1    Number of Visits 12    Date for PT Re-Evaluation 04/13/24    Authorization Type Medicare/[employer]    Progress Note Due on Visit 10    PT Start Time 1417    PT Stop Time 1502    PT Time Calculation (min) 45 min    Activity Tolerance Patient tolerated treatment well    Behavior During Therapy WFL for tasks assessed/performed          Past Medical History:  Diagnosis Date   Diabetes mellitus without complication ([name])    Fluid retention    High cholesterol    Hypertension    Sleep apnea    SOB (shortness of breath)    Past Surgical History:  Procedure Laterality Date   CARPAL TUNNEL RELEASE     FOOT SURGERY     KNEE SURGERY     Patient Active Problem List   Diagnosis Date Noted   Hypertriglyceridemia 01/13/2023   Essential hypertension 10/19/2022   Morbid obesity ([name]) 10/19/2022   BMI 50.0-59.9, adult ([name]) 10/19/2022   Dyslipidemia 05/03/2022   Class 3 severe obesity with serious comorbidity and body mass index (BMI) of 50.0 to 59.9 in adult 05/03/2022   Vitamin D  deficiency 03/30/2022   Hyperlipidemia associated with type 2 diabetes mellitus ([name]) 12/28/2021   Hypertension associated with type 2 diabetes mellitus ([name]) 12/22/2021   Type 2 diabetes mellitus with hyperglycemia, without long-term current use of insulin  ([name]) 12/22/2021   Dyspnea 05/26/2021   Physical deconditioning 05/26/2021   Morbid obesity due to excess calories ([name]) 05/26/2021   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  Balance was really bad at first, because it felt like I was pulling to the right. Pt accompanied by: self  PERTINENT HISTORY: from ENT notes:  two month history of right sided ear pressure/fullness and tinnitus (non-pulsatile). The hearing seems muffled in this ear. He was evaluated in the ED on 12/29/23 for dizziness and ear fullness on the right side. This began in April as new problems; denies associated illness or head trauma. ECG demonstrated sinus rhythm with frequent PVCs.   MRI Brain performed on 12/29/23: IMPRESSION:  Artifact from the patient's braces significantly limits evaluation with a large portion of the brain obscured on diffusion-weighted and susceptibility weighted imaging. Within this limitation, no visible acute abnormality.   PAIN:  Are you having pain? No  PRECAUTIONS: None  RED FLAGS: None   WEIGHT BEARING RESTRICTIONS: No  FALLS: Has patient fallen in last 6 months? No  LIVING ENVIRONMENT: Lives with: lives with their family and lives with their spouse  PLOF: Independent Enjoys church, shopping  PATIENT GOALS:  To get ringing gone  OBJECTIVE:  Note: Objective measures were completed at Evaluation unless otherwise noted.  DIAGNOSTIC FINDINGS: See above  COGNITION: Overall cognitive status: Within  functional limits for tasks assessed   POSTURE:  rounded shoulders and forward head  Cervical ROM:    Active A/PROM (deg) eval  Flexion 40  Extension 45  Right lateral flexion   Left lateral flexion   Right rotation WFL  Left rotation WFL  (Blank rows = not tested)  BED MOBILITY:  Independent  TRANSFERS: Assistive device utilized: None  Sit to stand: Complete Independence Stand to sit: Complete Independence  GAIT: Gait  pattern: step through pattern Distance walked: 50 ft Assistive device utilized: None Level of assistance: Complete Independence Comments: Reports occasional veering and more difficulty with quick head turns  PATIENT SURVEYS:  DHI: THE DIZZINESS HANDICAP INVENTORY (DHI)  P1. Does looking up increase your problem? 2 = Sometimes  E2. Because of your problem, do you feel frustrated? 4 = Yes  F3. Because of your problem, do you restrict your travel for business or recreation?  2 = Sometimes  P4. Does walking down the aisle of a supermarket increase your problems?  2 = Sometimes  F5. Because of your problem, do you have difficulty getting into or out of bed?  2 = Sometimes  F6. Does your problem significantly restrict your participation in social activities, such as going out to dinner, going to the movies, dancing, or going to parties? 2 = Sometimes  F7. Because of your problem, do you have difficulty reading?  0 = No  P8. Does performing more ambitious activities such as sports, dancing, household chores (sweeping or putting dishes away) increase your problems?  4 = Yes  E9. Because of your problem, are you afraid to leave your home without having without having someone accompany you?  0 = No  E10. Because of your problem have you been embarrassed in front of others?  4 = Yes  P11. Do quick movements of your head increase your problem?  4 = Yes  F12. Because of your problem, do you avoid heights?  0 = No  P13. Does turning over in bed increase your problem?  2 = Sometimes  F14. Because of your problem, is it difficult for you to do strenuous homework or yard work? 4 = Yes  E15. Because of your problem, are you afraid people may think you are intoxicated? 4 = Yes  F16. Because of your problem, is it difficult for you to go for a walk by yourself?  2 = Sometimes  P17. Does walking down a sidewalk increase your problem?  2 = Sometimes  E18.Because of your problem, is it difficult for you to  concentrate 2 = Sometimes  F19. Because of your problem, is it difficult for you to walk around your house in the dark? 2 = Sometimes  E20. Because of your problem, are you afraid to stay home alone?  0 = No  E21. Because of your problem, do you feel handicapped? 4 = Yes  E22. Has the problem placed stress on your relationships with members of your family or friends? 2 = Sometimes  E23. Because of your problem, are you depressed?  0 = No  F24. Does your problem interfere with your job or household responsibilities?  4 = Yes  P25. Does bending over increase your problem?  4 = Yes  TOTAL 58    DHI Scoring Instructions  The patient is asked to answer each question as it pertains to dizziness or unsteadiness problems, specifically  considering their condition during the last month. Questions are designed to incorporate functional (F), physical  (  P), and emotional (E) impacts on disability.   Scores greater than 10 points should be referred to balance specialists for further evaluation.   16-34 Points (mild handicap)  36-52 Points (moderate handicap)  54+ Points (severe handicap)  Minimally Detectable Change: 17 points ([address] [place], 1990)  [place], G. SHAUNNA. and [place], C. W. (1990). The development of the Dizziness Handicap Inventory. Archives of Otolaryngology - Head and Neck Surgery 116(4): [id number].    VESTIBULAR ASSESSMENT:  GENERAL OBSERVATION: No acute distress   SYMPTOM BEHAVIOR:  Subjective history: Two month history of right sided ear pressure/fullness and tinnitus. The hearing seems muffled in this ear. He was evaluated in the ED on 12/29/23 for dizziness and ear fullness on the right side. This began in April as new problems; denies associated illness or head trauma. He does report an episode several years ago of vertigo, which he reports occurred while driving and he felt like it was a corkscrew with his vision and dizziness.  Currently, he reports dizziness, imbalance,  dysequilibrium, where he is more off balance with quick head motions,bending down and when he first stands up.  Tinnitus is still his main complaint.  He denies migraines, does report hx of eye issues (?retinopathy since Covid, per pt report).  Non-Vestibular symptoms: changes in hearing, tinnitus, and nausea/vomiting  Type of dizziness: Imbalance (Disequilibrium), Spinning/Vertigo, and Unsteady with head/body turns  Frequency: several times a weeks  Duration: minutes/hours  Aggravating factors: Induced by position change: sit to stand and Induced by motion: bending down to the ground, turning body quickly, and turning head quickly  Relieving factors: closing eyes, slow movements, and deep breathing  Progression of symptoms: better  OCULOMOTOR EXAM: progressive lenses  Ocular Alignment: normal  Ocular ROM: No Limitations  Spontaneous Nystagmus: absent  Gaze-Induced Nystagmus: absent  Smooth Pursuits: intact  Saccades: intact  Convergence/Divergence: 3 cm *Pt reports that PT's nose/face is double while performing convergence activity   VESTIBULAR - OCULAR REFLEX:   Slow VOR: Comment: feels double vision distant with horizontal  VOR Cancellation: Comment: feels like a little catch up to midline  Head-Impulse Test: HIT Right: negative HIT Left: negative  Dynamic Visual Acuity: NT   POSITIONAL TESTING: Right Dix-Hallpike: no nystagmus Left Dix-Hallpike: no nystagmus Right Roll Test: no nystagmus Left Roll Test: no nystagmus   M-CTSIB  Condition 1: Firm Surface, EO 30 Sec, Normal Sway  Condition 2: Firm Surface, EC 30 Sec, Mild and Moderate Sway to R   Condition 3: Foam Surface, EO 30 Sec, Mild Sway  Condition 4: Foam Surface, EC 30 Sec, Mild and Moderate Sway    MOTION SENSITIVITY: NOT TESTED AT EVAL  Motion Sensitivity Quotient Intensity: 0 = none, 1 = Lightheaded, 2 = Mild, 3 = Moderate, 4 = Severe, 5 = Vomiting  Intensity  1. Sitting to supine   2. Supine to L side   3.  Supine to R side   4. Supine to sitting   5. L Hallpike-Dix   6. Up from L    7. R Hallpike-Dix   8. Up from R    9. Sitting, head tipped to L knee   10. Head up from L knee   11. Sitting, head tipped to R knee   12. Head up from R knee   13. Sitting head turns x5   14.Sitting head nods x5   15. In stance, 180 turn to L    16. In stance, 180 turn to R  OTHOSTATICS: not done                                                                                                                            TREATMENT DATE: 03/07/2024   Gaze Adaptation:  x1 Viewing Horizontal: Position: seated, Time: 30 sec, and Reps: 2  Other: Initiated HEP-see below  PATIENT EDUCATION: Education details: Eval results, POC, initiated HEP Person educated: Patient Education method: Explanation, Demonstration, and Handouts Education comprehension: verbalized understanding, returned demonstration, and needs further education  HOME EXERCISE PROGRAM: Access Code: [email or web address] URL: [email or web address] Date: 03/08/2024 Prepared by: [facility] - Outpatient  Rehab - [facility]  Exercises - Seated Gaze Stabilization with Head Rotation  - 1 x daily - 7 x weekly - 1 sets - 3 reps - 30 sec hold GOALS: Goals reviewed with patient? Yes  SHORT TERM GOALS: Target date: 03/30/2024  Pt will be independent with HEP for improved dizziness and balance. Baseline: Goal status: INITIAL   LONG TERM GOALS: Target date: 04/13/2024  Pt will be independent with HEP for improved balance, dizziness. Baseline:  Goal status: INITIAL  2.  Pt will improve FGA score to at least 24/30 to decrease fall risk.  Baseline: TBA Goal status: INITIAL  3.  Pt will perform Condition 2 and 4 on MCTSIB with mild sway for improved balance. Baseline:  Goal status: INITIAL  4.  Pt will improve DHI score to less than or equal to 40, to demo decreased dizziness affecting daily activities. Baseline: 58 Goal status:  INITIAL    ASSESSMENT:  CLINICAL IMPRESSION: Patient is a 58 y.o. male who was seen today for physical therapy evaluation and treatment for dizziness and giddiness.  He presented to ED 12/29/2023 due to dizziness, ear fullness, ear ringing.  He feels that the dizziness/off-balance sensation has improved, but he continues to have tinnitus.  With oculomotor testing, pt does report distant double vision while attempting convergence.  He has increased symptoms with slow VOR.  He has increased sway and reports of increased imbalance symptoms with MCTSIB testing, indicating decreased vestibular system use for balance.  With positional testing, he does not have nystagmus or dizziness in any position.  He would benefit from skilled PT to address the above deficits with vestibular system, balance, dizziness, for improved overall functional mobility and decreased fall risk.  OBJECTIVE IMPAIRMENTS: decreased balance and dizziness.   ACTIVITY LIMITATIONS: bending, transfers, and locomotion level  PARTICIPATION LIMITATIONS: meal prep, cleaning, laundry, shopping, and community activity  PERSONAL FACTORS: 3+ comorbidities: See above are also affecting patient's functional outcome.   REHAB POTENTIAL: Good  CLINICAL DECISION MAKING: Stable/uncomplicated  EVALUATION COMPLEXITY: Low   PLAN:  PT FREQUENCY: 1-2x/week  PT DURATION: 6 weeks, including eval week  PLANNED INTERVENTIONS: 97750- Physical Performance Testing, 97110-Therapeutic exercises, 97530- Therapeutic activity, [id number]- Neuromuscular re-education, 97535- Self Care, [id number]- Manual therapy, [id number]- Gait training, [phone]- Canalith repositioning, Patient/Family education, and Balance training  PLAN FOR NEXT SESSION: Further ocular  testing (cross/cover tests); progress HEP.  Perform FGA   Javyn Havlin W., PT 03/08/2024, 8:18 AM   [facility] at [facility] [address] [place], [address] [place], [place] [address] Phone #  [phone] Fax # [phone]

## 2024-03-08 ENCOUNTER — Institutional Professional Consult (permissible substitution) (INDEPENDENT_AMBULATORY_CARE_PROVIDER_SITE_OTHER): Admitting: Otolaryngology

## 2024-03-09 ENCOUNTER — Ambulatory Visit

## 2024-03-09 DIAGNOSIS — R42 Dizziness and giddiness: Secondary | ICD-10-CM

## 2024-03-09 DIAGNOSIS — R2681 Unsteadiness on feet: Secondary | ICD-10-CM

## 2024-03-09 NOTE — Therapy (Signed)
 OUTPATIENT PHYSICAL THERAPY VESTIBULAR TREATMENT     Patient Name: Philip [name] MRN: [id number] DOB:Sep 19, 1965, 58 y.o., male Today's Date: 03/09/2024  END OF SESSION:  PT End of Session - 03/09/24 0917     Visit Number 2    Number of Visits 12    Date for PT Re-Evaluation 04/13/24    Authorization Type Medicare/[employer]HC    Progress Note Due on Visit 10    PT Start Time 0930    PT Stop Time 1015    PT Time Calculation (min) 45 min    Activity Tolerance Patient tolerated treatment well    Behavior During Therapy WFL for tasks assessed/performed          Past Medical History:  Diagnosis Date   Diabetes mellitus without complication ([name])    Fluid retention    High cholesterol    Hypertension    Sleep apnea    SOB (shortness of breath)    Past Surgical History:  Procedure Laterality Date   CARPAL TUNNEL RELEASE     FOOT SURGERY     KNEE SURGERY     Patient Active Problem List   Diagnosis Date Noted   Hypertriglyceridemia 01/13/2023   Essential hypertension 10/19/2022   Morbid obesity ([name]) 10/19/2022   BMI 50.0-59.9, adult ([name]) 10/19/2022   Dyslipidemia 05/03/2022   Class 3 severe obesity with serious comorbidity and body mass index (BMI) of 50.0 to 59.9 in adult 05/03/2022   Vitamin D  deficiency 03/30/2022   Hyperlipidemia associated with type 2 diabetes mellitus ([name]) 12/28/2021   Hypertension associated with type 2 diabetes mellitus ([name]) 12/22/2021   Type 2 diabetes mellitus with hyperglycemia, without long-term current use of insulin  ([name]) 12/22/2021   Dyspnea 05/26/2021   Physical deconditioning 05/26/2021   Morbid obesity due to excess calories ([name]) 05/26/2021   COVID-19 long hauler 05/26/2021   Restrictive lung disease 05/26/2021   OSA (obstructive sleep apnea) 02/23/2021    PCP: Henry Ingle, MD  REFERRING PROVIDER: Benay Kay, PA-C   REFERRING DIAG: R42 (ICD-10-CM) - Dizziness and giddiness   THERAPY DIAG:  Dizziness and  giddiness  Unsteadiness on feet  ONSET DATE: 6+/08/2024 (MD referral)  Rationale for Evaluation and Treatment: Rehabilitation  SUBJECTIVE:   SUBJECTIVE STATEMENT:  Pt accompanied by: self  PERTINENT HISTORY: from ENT notes:  two month history of right sided ear pressure/fullness and tinnitus (non-pulsatile). The hearing seems muffled in this ear. He was evaluated in the ED on 12/29/23 for dizziness and ear fullness on the right side. This began in April as new problems; denies associated illness or head trauma. ECG demonstrated sinus rhythm with frequent PVCs.   MRI Brain performed on 12/29/23: IMPRESSION:  Artifact from the patient's braces significantly limits evaluation with a large portion of the brain obscured on diffusion-weighted and susceptibility weighted imaging. Within this limitation, no visible acute abnormality.   PAIN:  Are you having pain? No  PRECAUTIONS: None  RED FLAGS: None   WEIGHT BEARING RESTRICTIONS: No  FALLS: Has patient fallen in last 6 months? No  LIVING ENVIRONMENT: Lives with: lives with their family and lives with their spouse  PLOF: Independent Enjoys church, shopping  PATIENT GOALS:  To get ringing gone  OBJECTIVE:    TODAY'S TREATMENT: 03/09/24 Activity Comments  Cover test and cross-cover No refixation noted  Weber test Notes symmetry  Rinne test symmetric  Seated VOR x 1 -30 sec slow, no symptoms -30 sec at 120 bpm, increased symptoms 4/10  Corner balance For  balance/habituation  Brandt-Daroff x 3 reps Increased symptoms to the right. 3/10 symptoms    PATIENT EDUCATION: Education details: Eval results, POC, initiated HEP Person educated: Patient Education method: Explanation, Demonstration, and Handouts Education comprehension: verbalized understanding, returned demonstration, and needs further education  HOME EXERCISE PROGRAM: Access Code: [email or web address] URL: [email or web address] Date: 03/08/2024 Prepared by: [facility]  - Outpatient  Rehab - [facility]  Exercises - Seated Gaze Stabilization with Head Rotation  - 1 x daily - 7 x weekly - 1 sets - 3 reps - 30 sec hold - Corner Balance Feet Together With Eyes Closed  - 1 x daily - 7 x weekly - 3 sets - 30 sec hold - Corner Balance Feet Together: Eyes Open With Head Turns  - 1 x daily - 7 x weekly - 3 sets - 30 sec hold - Corner Balance Feet Together: Eyes Closed With Head Turns  - 1 x daily - 7 x weekly - 3 sets - 30 sec hold - Tandem Stance in Corner  - 1 x daily - 7 x weekly - 3 sets - 30 sec hold - Brandt-Daroff Vestibular Exercise  - 1 x daily - 7 x weekly - 5 reps  Note: Objective measures were completed at Evaluation unless otherwise noted.  DIAGNOSTIC FINDINGS: See above  COGNITION: Overall cognitive status: Within functional limits for tasks assessed   POSTURE:  rounded shoulders and forward head  Cervical ROM:    Active A/PROM (deg) eval  Flexion 40  Extension 45  Right lateral flexion   Left lateral flexion   Right rotation WFL  Left rotation WFL  (Blank rows = not tested)  BED MOBILITY:  Independent  TRANSFERS: Assistive device utilized: None  Sit to stand: Complete Independence Stand to sit: Complete Independence  GAIT: Gait pattern: step through pattern Distance walked: 50 ft Assistive device utilized: None Level of assistance: Complete Independence Comments: Reports occasional veering and more difficulty with quick head turns  PATIENT SURVEYS:  DHI: THE DIZZINESS HANDICAP INVENTORY (DHI)  P1. Does looking up increase your problem? 2 = Sometimes  E2. Because of your problem, do you feel frustrated? 4 = Yes  F3. Because of your problem, do you restrict your travel for business or recreation?  2 = Sometimes  P4. Does walking down the aisle of a supermarket increase your problems?  2 = Sometimes  F5. Because of your problem, do you have difficulty getting into or out of bed?  2 = Sometimes  F6. Does your  problem significantly restrict your participation in social activities, such as going out to dinner, going to the movies, dancing, or going to parties? 2 = Sometimes  F7. Because of your problem, do you have difficulty reading?  0 = No  P8. Does performing more ambitious activities such as sports, dancing, household chores (sweeping or putting dishes away) increase your problems?  4 = Yes  E9. Because of your problem, are you afraid to leave your home without having without having someone accompany you?  0 = No  E10. Because of your problem have you been embarrassed in front of others?  4 = Yes  P11. Do quick movements of your head increase your problem?  4 = Yes  F12. Because of your problem, do you avoid heights?  0 = No  P13. Does turning over in bed increase your problem?  2 = Sometimes  F14. Because of your problem, is it difficult for you to do strenuous homework or yard  work? 4 = Yes  E15. Because of your problem, are you afraid people may think you are intoxicated? 4 = Yes  F16. Because of your problem, is it difficult for you to go for a walk by yourself?  2 = Sometimes  P17. Does walking down a sidewalk increase your problem?  2 = Sometimes  E18.Because of your problem, is it difficult for you to concentrate 2 = Sometimes  F19. Because of your problem, is it difficult for you to walk around your house in the dark? 2 = Sometimes  E20. Because of your problem, are you afraid to stay home alone?  0 = No  E21. Because of your problem, do you feel handicapped? 4 = Yes  E22. Has the problem placed stress on your relationships with members of your family or friends? 2 = Sometimes  E23. Because of your problem, are you depressed?  0 = No  F24. Does your problem interfere with your job or household responsibilities?  4 = Yes  P25. Does bending over increase your problem?  4 = Yes  TOTAL 58    DHI Scoring Instructions  The patient is asked to answer each question as it pertains to dizziness  or unsteadiness problems, specifically  considering their condition during the last month. Questions are designed to incorporate functional (F), physical  (P), and emotional (E) impacts on disability.   Scores greater than 10 points should be referred to balance specialists for further evaluation.   16-34 Points (mild handicap)  36-52 Points (moderate handicap)  54+ Points (severe handicap)  Minimally Detectable Change: 17 points ([address] [place], 1990)  [place], G. SHAUNNA. and Arabi, C. W. (1990). The development of the Dizziness Handicap Inventory. Archives of Otolaryngology - Head and Neck Surgery 116(4): [id number].    VESTIBULAR ASSESSMENT:  GENERAL OBSERVATION: No acute distress   SYMPTOM BEHAVIOR:  Subjective history: Two month history of right sided ear pressure/fullness and tinnitus. The hearing seems muffled in this ear. He was evaluated in the ED on 12/29/23 for dizziness and ear fullness on the right side. This began in April as new problems; denies associated illness or head trauma. He does report an episode several years ago of vertigo, which he reports occurred while driving and he felt like it was a corkscrew with his vision and dizziness.  Currently, he reports dizziness, imbalance, dysequilibrium, where he is more off balance with quick head motions,bending down and when he first stands up.  Tinnitus is still his main complaint.  He denies migraines, does report hx of eye issues (?retinopathy since Covid, per pt report).  Non-Vestibular symptoms: changes in hearing, tinnitus, and nausea/vomiting  Type of dizziness: Imbalance (Disequilibrium), Spinning/Vertigo, and Unsteady with head/body turns  Frequency: several times a weeks  Duration: minutes/hours  Aggravating factors: Induced by position change: sit to stand and Induced by motion: bending down to the ground, turning body quickly, and turning head quickly  Relieving factors: closing eyes, slow movements, and deep  breathing  Progression of symptoms: better  OCULOMOTOR EXAM: progressive lenses  Ocular Alignment: normal  Ocular ROM: No Limitations  Spontaneous Nystagmus: absent  Gaze-Induced Nystagmus: absent  Smooth Pursuits: intact  Saccades: intact  Convergence/Divergence: 3 cm *Pt reports that PT's nose/face is double while performing convergence activity   VESTIBULAR - OCULAR REFLEX:   Slow VOR: Comment: feels double vision distant with horizontal  VOR Cancellation: Comment: feels like a little catch up to midline  Head-Impulse Test: HIT Right: negative HIT Left:  negative  Dynamic Visual Acuity: NT   POSITIONAL TESTING: Right Dix-Hallpike: no nystagmus Left Dix-Hallpike: no nystagmus Right Roll Test: no nystagmus Left Roll Test: no nystagmus   M-CTSIB  Condition 1: Firm Surface, EO 30 Sec, Normal Sway  Condition 2: Firm Surface, EC 30 Sec, Mild and Moderate Sway to R   Condition 3: Foam Surface, EO 30 Sec, Mild Sway  Condition 4: Foam Surface, EC 30 Sec, Mild and Moderate Sway    MOTION SENSITIVITY: NOT TESTED AT EVAL  Motion Sensitivity Quotient Intensity: 0 = none, 1 = Lightheaded, 2 = Mild, 3 = Moderate, 4 = Severe, 5 = Vomiting  Intensity  1. Sitting to supine   2. Supine to L side   3. Supine to R side   4. Supine to sitting   5. L Hallpike-Dix   6. Up from L    7. R Hallpike-Dix   8. Up from R    9. Sitting, head tipped to L knee   10. Head up from L knee   11. Sitting, head tipped to R knee   12. Head up from R knee   13. Sitting head turns x5   14.Sitting head nods x5   15. In stance, 180 turn to L    16. In stance, 180 turn to R     OTHOSTATICS: not done                                                                                                                            TREATMENT DATE: 03/07/2024   Gaze Adaptation:  x1 Viewing Horizontal: Position: seated, Time: 30 sec, and Reps: 2  Other: Initiated HEP-see below   GOALS: Goals reviewed with  patient? Yes  SHORT TERM GOALS: Target date: 03/30/2024  Pt will be independent with HEP for improved dizziness and balance. Baseline: Goal status: INITIAL   LONG TERM GOALS: Target date: 04/13/2024  Pt will be independent with HEP for improved balance, dizziness. Baseline:  Goal status: INITIAL  2.  Pt will improve FGA score to at least 24/30 to decrease fall risk.  Baseline: TBA Goal status: INITIAL  3.  Pt will perform Condition 2 and 4 on MCTSIB with mild sway for improved balance. Baseline:  Goal status: INITIAL  4.  Pt will improve DHI score to less than or equal to 40, to demo decreased dizziness affecting daily activities. Baseline: 58 Goal status: INITIAL    ASSESSMENT:  CLINICAL IMPRESSION: Cover and cross-cover unrevealing for any refixation of gaze. Weber and Rinne test symmetric. Review to VOR x 1 with correction to exercise as pt was performing VOR cancellation. Notes provocation to symptoms with VOR at faster pace of 120 bpm. Instructed in corner balance activities for habituation. Brandt-Daroff for habituation and noticing increased symptoms to right ear dependent position. Continued sessions to progress POC details to reduce symptoms and improve balance.   OBJECTIVE IMPAIRMENTS: decreased balance and dizziness.   ACTIVITY LIMITATIONS:  bending, transfers, and locomotion level  PARTICIPATION LIMITATIONS: meal prep, cleaning, laundry, shopping, and community activity  PERSONAL FACTORS: 3+ comorbidities: See above are also affecting patient's functional outcome.   REHAB POTENTIAL: Good  CLINICAL DECISION MAKING: Stable/uncomplicated  EVALUATION COMPLEXITY: Low   PLAN:  PT FREQUENCY: 1-2x/week  PT DURATION: 6 weeks, including eval week  PLANNED INTERVENTIONS: 97750- Physical Performance Testing, 97110-Therapeutic exercises, 97530- Therapeutic activity, [id number]- Neuromuscular re-education, 97535- Self Care, [id number]- Manual therapy, [id number]- Gait training,  [phone]- Canalith repositioning, Patient/Family education, and Balance training  PLAN FOR NEXT SESSION:  progress HEP.  Perform FGA   Jonette MARLA Sandifer, PT 03/09/2024, 9:17 AM   [facility] at [facility] [address] [place], [address] [place], [place] [address] Phone # [phone] Fax # [phone]

## 2024-03-13 DIAGNOSIS — I493 Ventricular premature depolarization: Secondary | ICD-10-CM | POA: Diagnosis not present

## 2024-03-19 ENCOUNTER — Ambulatory Visit: Admitting: Physical Therapy

## 2024-03-22 ENCOUNTER — Ambulatory Visit: Attending: Physician Assistant

## 2024-03-22 ENCOUNTER — Ambulatory Visit (INDEPENDENT_AMBULATORY_CARE_PROVIDER_SITE_OTHER): Admitting: Bariatrics

## 2024-03-22 ENCOUNTER — Encounter: Payer: Self-pay | Admitting: Bariatrics

## 2024-03-22 VITALS — BP 140/55 | HR 41 | Temp 98.0°F | Ht 71.0 in | Wt 353.0 lb

## 2024-03-22 DIAGNOSIS — Z6841 Body Mass Index (BMI) 40.0 and over, adult: Secondary | ICD-10-CM | POA: Diagnosis not present

## 2024-03-22 DIAGNOSIS — E559 Vitamin D deficiency, unspecified: Secondary | ICD-10-CM | POA: Diagnosis not present

## 2024-03-22 DIAGNOSIS — R2681 Unsteadiness on feet: Secondary | ICD-10-CM | POA: Diagnosis present

## 2024-03-22 DIAGNOSIS — E1165 Type 2 diabetes mellitus with hyperglycemia: Secondary | ICD-10-CM

## 2024-03-22 DIAGNOSIS — E119 Type 2 diabetes mellitus without complications: Secondary | ICD-10-CM | POA: Diagnosis not present

## 2024-03-22 DIAGNOSIS — R42 Dizziness and giddiness: Secondary | ICD-10-CM | POA: Insufficient documentation

## 2024-03-22 DIAGNOSIS — Z0000 Encounter for general adult medical examination without abnormal findings: Secondary | ICD-10-CM

## 2024-03-22 DIAGNOSIS — Z7985 Long-term (current) use of injectable non-insulin antidiabetic drugs: Secondary | ICD-10-CM

## 2024-03-22 MED ORDER — TIRZEPATIDE 15 MG/0.5ML ~~LOC~~ SOAJ: 15.0000 mg | SUBCUTANEOUS | 0 refills | Status: DC

## 2024-03-22 MED ORDER — VITAMIN D (ERGOCALCIFEROL) 1.25 MG (50000 UNIT) PO CAPS: 50000.0000 [IU] | ORAL_CAPSULE | ORAL | 0 refills | Status: DC

## 2024-03-22 NOTE — Therapy (Signed)
 OUTPATIENT PHYSICAL THERAPY VESTIBULAR TREATMENT     Patient Name: Philip [name] MRN: [id number] DOB:1966-03-06, 58 y.o., male Today's Date: 03/22/2024  END OF SESSION:  PT End of Session - 03/22/24 0932     Visit Number 3    Number of Visits 12    Date for PT Re-Evaluation 04/13/24    Authorization Type Medicare/UHC    Progress Note Due on Visit 10    PT Start Time 0930    PT Stop Time 1015    PT Time Calculation (min) 45 min    Activity Tolerance Patient tolerated treatment well    Behavior During Therapy WFL for tasks assessed/performed          Past Medical History:  Diagnosis Date   Diabetes mellitus without complication ([name])    Fluid retention    High cholesterol    Hypertension    Sleep apnea    SOB (shortness of breath)    Past Surgical History:  Procedure Laterality Date   CARPAL TUNNEL RELEASE     FOOT SURGERY     KNEE SURGERY     Patient Active Problem List   Diagnosis Date Noted   Hypertriglyceridemia 01/13/2023   Essential hypertension 10/19/2022   Morbid obesity ([name]) 10/19/2022   BMI 50.0-59.9, adult ([name]) 10/19/2022   Dyslipidemia 05/03/2022   Class 3 severe obesity with serious comorbidity and body mass index (BMI) of 50.0 to 59.9 in adult 05/03/2022   Vitamin D  deficiency 03/30/2022   Hyperlipidemia associated with type 2 diabetes mellitus ([name]) 12/28/2021   Hypertension associated with type 2 diabetes mellitus ([name]) 12/22/2021   Type 2 diabetes mellitus with hyperglycemia, without long-term current use of insulin  ([name]) 12/22/2021   Dyspnea 05/26/2021   Physical deconditioning 05/26/2021   Morbid obesity due to excess calories ([name]) 05/26/2021   COVID-19 long hauler 05/26/2021   Restrictive lung disease 05/26/2021   OSA (obstructive sleep apnea) 02/23/2021    PCP: Henry Ingle, MD  REFERRING PROVIDER: Benay Kay, PA-C   REFERRING DIAG: R42 (ICD-10-CM) - Dizziness and giddiness   THERAPY DIAG:  Dizziness and  giddiness  Unsteadiness on feet  ONSET DATE: 6+/08/2024 (MD referral)  Rationale for Evaluation and Treatment: Rehabilitation  SUBJECTIVE:   SUBJECTIVE STATEMENT: Dizziness feeling about the same. Notes the pressure in thr right ear has improved, still experiencing tinnitus with decreased intensity.  Pt accompanied by: self  PERTINENT HISTORY: from ENT notes:  two month history of right sided ear pressure/fullness and tinnitus (non-pulsatile). The hearing seems muffled in this ear. He was evaluated in the ED on 12/29/23 for dizziness and ear fullness on the right side. This began in April as new problems; denies associated illness or head trauma. ECG demonstrated sinus rhythm with frequent PVCs.   MRI Brain performed on 12/29/23: IMPRESSION:  Artifact from the patient's braces significantly limits evaluation with a large portion of the brain obscured on diffusion-weighted and susceptibility weighted imaging. Within this limitation, no visible acute abnormality.   PAIN:  Are you having pain? No  PRECAUTIONS: None  RED FLAGS: None   WEIGHT BEARING RESTRICTIONS: No  FALLS: Has patient fallen in last 6 months? No  LIVING ENVIRONMENT: Lives with: lives with their family and lives with their spouse  PLOF: Independent Enjoys church, shopping  PATIENT GOALS:  To get ringing gone  OBJECTIVE:   TODAY'S TREATMENT: 03/22/24 Activity Comments  Baseline symptoms: 4/10 at rest--wobbly   10 meter walk 15.69 sec= 2.09 ft/sec   FGA 21/30  HEP review -VOR x 1 x 30 sec slow and 120 bpm 3-4/10 symptoms  Forward T at counter 2x5 reps Cues for gaze fixation on ground and target on wall  Static multisensory balance On firm/foam  Standing Russian twist w/ lettered ball VOR cancellation 4/10 sx  Vertical ball toss/catch w/ gaze fixation on lettered ball        [facility] PT Assessment - 03/22/24 0001       Functional Gait  Assessment   Gait assessed  Yes    Gait Level Surface Walks 20 ft,  slow speed, abnormal gait pattern, evidence for imbalance or deviates 10-15 in outside of the 12 in walkway width. Requires more than 7 sec to ambulate 20 ft.    Change in Gait Speed Able to change speed, demonstrates mild gait deviations, deviates 6-10 in outside of the 12 in walkway width, or no gait deviations, unable to achieve a major change in velocity, or uses a change in velocity, or uses an assistive device.    Gait with Horizontal Head Turns Performs head turns smoothly with slight change in gait velocity (eg, minor disruption to smooth gait path), deviates 6-10 in outside 12 in walkway width, or uses an assistive device.    Gait with Vertical Head Turns Performs task with slight change in gait velocity (eg, minor disruption to smooth gait path), deviates 6 - 10 in outside 12 in walkway width or uses assistive device    Gait and Pivot Turn Pivot turns safely in greater than 3 sec and stops with no loss of balance, or pivot turns safely within 3 sec and stops with mild imbalance, requires small steps to catch balance.    Step Over Obstacle Is able to step over 2 stacked shoe boxes taped together (9 in total height) without changing gait speed. No evidence of imbalance.    Gait with Narrow Base of Support Ambulates 7-9 steps.    Gait with Eyes Closed Walks 20 ft, uses assistive device, slower speed, mild gait deviations, deviates 6-10 in outside 12 in walkway width. Ambulates 20 ft in less than 9 sec but greater than 7 sec.    Ambulating Backwards Walks 20 ft, uses assistive device, slower speed, mild gait deviations, deviates 6-10 in outside 12 in walkway width.    Steps Alternating feet, no rail.    Total Score 21            PATIENT EDUCATION: Education details: Eval results, POC, initiated HEP Person educated: Patient Education method: Explanation, Demonstration, and Handouts Education comprehension: verbalized understanding, returned demonstration, and needs further education  HOME  EXERCISE PROGRAM: Access Code: [email or web address] URL: [email or web address] Date: 03/08/2024 Prepared by: [facility] - Outpatient  Rehab - [facility]  Exercises - Seated Gaze Stabilization with Head Rotation  - 1 x daily - 7 x weekly - 1 sets - 3 reps - 30 sec hold - Corner Balance Feet Together With Eyes Closed  - 1 x daily - 7 x weekly - 3 sets - 30 sec hold - Corner Balance Feet Together: Eyes Open With Head Turns  - 1 x daily - 7 x weekly - 3 sets - 30 sec hold - Corner Balance Feet Together: Eyes Closed With Head Turns  - 1 x daily - 7 x weekly - 3 sets - 30 sec hold - Tandem Stance in Corner  - 1 x daily - 7 x weekly - 3 sets - 30 sec hold - Brandt-Daroff Vestibular Exercise  - 1  x daily - 7 x weekly - 5 reps  Note: Objective measures were completed at Evaluation unless otherwise noted.  DIAGNOSTIC FINDINGS: See above  COGNITION: Overall cognitive status: Within functional limits for tasks assessed   POSTURE:  rounded shoulders and forward head  Cervical ROM:    Active A/PROM (deg) eval  Flexion 40  Extension 45  Right lateral flexion   Left lateral flexion   Right rotation WFL  Left rotation WFL  (Blank rows = not tested)  BED MOBILITY:  Independent  TRANSFERS: Assistive device utilized: None  Sit to stand: Complete Independence Stand to sit: Complete Independence  GAIT: Gait pattern: step through pattern Distance walked: 50 ft Assistive device utilized: None Level of assistance: Complete Independence Comments: Reports occasional veering and more difficulty with quick head turns  PATIENT SURVEYS:  DHI: THE DIZZINESS HANDICAP INVENTORY (DHI)  P1. Does looking up increase your problem? 2 = Sometimes  E2. Because of your problem, do you feel frustrated? 4 = Yes  F3. Because of your problem, do you restrict your travel for business or recreation?  2 = Sometimes  P4. Does walking down the aisle of a supermarket increase your problems?  2 =  Sometimes  F5. Because of your problem, do you have difficulty getting into or out of bed?  2 = Sometimes  F6. Does your problem significantly restrict your participation in social activities, such as going out to dinner, going to the movies, dancing, or going to parties? 2 = Sometimes  F7. Because of your problem, do you have difficulty reading?  0 = No  P8. Does performing more ambitious activities such as sports, dancing, household chores (sweeping or putting dishes away) increase your problems?  4 = Yes  E9. Because of your problem, are you afraid to leave your home without having without having someone accompany you?  0 = No  E10. Because of your problem have you been embarrassed in front of others?  4 = Yes  P11. Do quick movements of your head increase your problem?  4 = Yes  F12. Because of your problem, do you avoid heights?  0 = No  P13. Does turning over in bed increase your problem?  2 = Sometimes  F14. Because of your problem, is it difficult for you to do strenuous homework or yard work? 4 = Yes  E15. Because of your problem, are you afraid people may think you are intoxicated? 4 = Yes  F16. Because of your problem, is it difficult for you to go for a walk by yourself?  2 = Sometimes  P17. Does walking down a sidewalk increase your problem?  2 = Sometimes  E18.Because of your problem, is it difficult for you to concentrate 2 = Sometimes  F19. Because of your problem, is it difficult for you to walk around your house in the dark? 2 = Sometimes  E20. Because of your problem, are you afraid to stay home alone?  0 = No  E21. Because of your problem, do you feel handicapped? 4 = Yes  E22. Has the problem placed stress on your relationships with members of your family or friends? 2 = Sometimes  E23. Because of your problem, are you depressed?  0 = No  F24. Does your problem interfere with your job or household responsibilities?  4 = Yes  P25. Does bending over increase your problem?  4 =  Yes  TOTAL 58    DHI Scoring Instructions  The patient  is asked to answer each question as it pertains to dizziness or unsteadiness problems, specifically  considering their condition during the last month. Questions are designed to incorporate functional (F), physical  (P), and emotional (E) impacts on disability.   Scores greater than 10 points should be referred to balance specialists for further evaluation.   16-34 Points (mild handicap)  36-52 Points (moderate handicap)  54+ Points (severe handicap)  Minimally Detectable Change: 17 points ([address] [place], 1990)  [place], G. SHAUNNA. and [place], C. W. (1990). The development of the Dizziness Handicap Inventory. Archives of Otolaryngology - Head and Neck Surgery 116(4): [id number].    VESTIBULAR ASSESSMENT:  GENERAL OBSERVATION: No acute distress   SYMPTOM BEHAVIOR:  Subjective history: Two month history of right sided ear pressure/fullness and tinnitus. The hearing seems muffled in this ear. He was evaluated in the ED on 12/29/23 for dizziness and ear fullness on the right side. This began in April as new problems; denies associated illness or head trauma. He does report an episode several years ago of vertigo, which he reports occurred while driving and he felt like it was a corkscrew with his vision and dizziness.  Currently, he reports dizziness, imbalance, dysequilibrium, where he is more off balance with quick head motions,bending down and when he first stands up.  Tinnitus is still his main complaint.  He denies migraines, does report hx of eye issues (?retinopathy since Covid, per pt report).  Non-Vestibular symptoms: changes in hearing, tinnitus, and nausea/vomiting  Type of dizziness: Imbalance (Disequilibrium), Spinning/Vertigo, and Unsteady with head/body turns  Frequency: several times a weeks  Duration: minutes/hours  Aggravating factors: Induced by position change: sit to stand and Induced by motion: bending down to the  ground, turning body quickly, and turning head quickly  Relieving factors: closing eyes, slow movements, and deep breathing  Progression of symptoms: better  OCULOMOTOR EXAM: progressive lenses  Ocular Alignment: normal  Ocular ROM: No Limitations  Spontaneous Nystagmus: absent  Gaze-Induced Nystagmus: absent  Smooth Pursuits: intact  Saccades: intact  Convergence/Divergence: 3 cm *Pt reports that PT's nose/face is double while performing convergence activity   VESTIBULAR - OCULAR REFLEX:   Slow VOR: Comment: feels double vision distant with horizontal  VOR Cancellation: Comment: feels like a little catch up to midline  Head-Impulse Test: HIT Right: negative HIT Left: negative  Dynamic Visual Acuity: NT   POSITIONAL TESTING: Right Dix-Hallpike: no nystagmus Left Dix-Hallpike: no nystagmus Right Roll Test: no nystagmus Left Roll Test: no nystagmus   M-CTSIB  Condition 1: Firm Surface, EO 30 Sec, Normal Sway  Condition 2: Firm Surface, EC 30 Sec, Mild and Moderate Sway to R   Condition 3: Foam Surface, EO 30 Sec, Mild Sway  Condition 4: Foam Surface, EC 30 Sec, Mild and Moderate Sway    MOTION SENSITIVITY: NOT TESTED AT EVAL  Motion Sensitivity Quotient Intensity: 0 = none, 1 = Lightheaded, 2 = Mild, 3 = Moderate, 4 = Severe, 5 = Vomiting  Intensity  1. Sitting to supine   2. Supine to L side   3. Supine to R side   4. Supine to sitting   5. L Hallpike-Dix   6. Up from L    7. R Hallpike-Dix   8. Up from R    9. Sitting, head tipped to L knee   10. Head up from L knee   11. Sitting, head tipped to R knee   12. Head up from R knee   13. Sitting  head turns x5   14.Sitting head nods x5   15. In stance, 180 turn to L    16. In stance, 180 turn to R     OTHOSTATICS: not done                                                                                                                            TREATMENT DATE: 03/07/2024   Gaze Adaptation:  x1 Viewing  Horizontal: Position: seated, Time: 30 sec, and Reps: 2  Other: Initiated HEP-see below   GOALS: Goals reviewed with patient? Yes  SHORT TERM GOALS: Target date: 03/30/2024  Pt will be independent with HEP for improved dizziness and balance. Baseline: Goal status: INITIAL   LONG TERM GOALS: Target date: 04/13/2024  Pt will be independent with HEP for improved balance, dizziness. Baseline:  Goal status: INITIAL  2.  Pt will improve FGA score to at least 24/30 to decrease fall risk.  Baseline: 21/30 Goal status: IN PROGRESS  3.  Pt will perform Condition 2 and 4 on MCTSIB with mild sway for improved balance. Baseline:  Goal status: INITIAL  4.  Pt will improve DHI score to less than or equal to 40, to demo decreased dizziness affecting daily activities. Baseline: 58 Goal status: INITIAL    ASSESSMENT:  CLINICAL IMPRESSION: FGA performed with score 21/30 indicating increased risk for falls with pt reporting greatest difficulty with balance oriented tasks such as tandem walk, pivot turn, and walk eyes closed.  Ambulates with slower pace overall which partly influenced score moving slowly/guarded as he did not deviate significantly with ambulation and head turns/extension.  Review to HEP with gaze stabilization in sitting tolerating increased speed without significant provocation and instructed to try in standing with feet apart/together to see if this influences.  Forward-T for flexion habituation with good tolerance and stability using UE support. Multisensory balance on firm/foam to improve postural stability/equilibrium with moderate sway under eyes closed and head movement conditions.  Continued sessions to progress POC details to improve mobility and reduce risk for falls.   OBJECTIVE IMPAIRMENTS: decreased balance and dizziness.   ACTIVITY LIMITATIONS: bending, transfers, and locomotion level  PARTICIPATION LIMITATIONS: meal prep, cleaning, laundry, shopping, and community  activity  PERSONAL FACTORS: 3+ comorbidities: See above are also affecting patient's functional outcome.   REHAB POTENTIAL: Good  CLINICAL DECISION MAKING: Stable/uncomplicated  EVALUATION COMPLEXITY: Low   PLAN:  PT FREQUENCY: 1-2x/week  PT DURATION: 6 weeks, including eval week  PLANNED INTERVENTIONS: 97750- Physical Performance Testing, 97110-Therapeutic exercises, 97530- Therapeutic activity, 97112- Neuromuscular re-education, 97535- Self Care, [id number]- Manual therapy, [phone]- Gait training, [phone]- Canalith repositioning, Patient/Family education, and Balance training  PLAN FOR NEXT SESSION:  progress HEP---standing/walking VOR   Jonette MARLA Sandifer, PT 03/22/2024, 9:32 AM   [facility] at [facility] [address] [place], [place] [address] Phone # [phone] Fax # [phone]

## 2024-03-22 NOTE — Therapy (Signed)
 OUTPATIENT PHYSICAL THERAPY VESTIBULAR TREATMENT     Patient Name: Philip [name] MRN: [id number] DOB:24-Dec-1965, 58 y.o., male Today's Date: 03/26/2024  END OF SESSION:  PT End of Session - 03/26/24 0842     Visit Number 4    Number of Visits 12    Date for PT Re-Evaluation 04/13/24    Authorization Type Medicare/UHC    Progress Note Due on Visit 10    PT Start Time 0802    PT Stop Time 0842    PT Time Calculation (min) 40 min    Activity Tolerance Patient tolerated treatment well    Behavior During Therapy [facility] for tasks assessed/performed           Past Medical History:  Diagnosis Date   Diabetes mellitus without complication ([name])    Fluid retention    High cholesterol    Hypertension    Sleep apnea    SOB (shortness of breath)    Past Surgical History:  Procedure Laterality Date   CARPAL TUNNEL RELEASE     FOOT SURGERY     KNEE SURGERY     Patient Active Problem List   Diagnosis Date Noted   Hypertriglyceridemia 01/13/2023   Essential hypertension 10/19/2022   Morbid obesity ([name]) 10/19/2022   BMI 50.0-59.9, adult ([name]) 10/19/2022   Dyslipidemia 05/03/2022   Class 3 severe obesity with serious comorbidity and body mass index (BMI) of 50.0 to 59.9 in adult 05/03/2022   Vitamin D  deficiency 03/30/2022   Hyperlipidemia associated with type 2 diabetes mellitus ([name]) 12/28/2021   Hypertension associated with type 2 diabetes mellitus ([name]) 12/22/2021   Type 2 diabetes mellitus with hyperglycemia, without long-term current use of insulin  ([name]) 12/22/2021   Dyspnea 05/26/2021   Physical deconditioning 05/26/2021   Morbid obesity due to excess calories ([name]) 05/26/2021   COVID-19 long hauler 05/26/2021   Restrictive lung disease 05/26/2021   OSA (obstructive sleep apnea) 02/23/2021    PCP: Henry Ingle, MD  REFERRING PROVIDER: Benay Kay, PA-C   REFERRING DIAG: R42 (ICD-10-CM) - Dizziness and giddiness   THERAPY DIAG:  Dizziness and  giddiness  Unsteadiness on feet  ONSET DATE: 6+/08/2024 (MD referral)  Rationale for Evaluation and Treatment: Rehabilitation  SUBJECTIVE:   SUBJECTIVE STATEMENT: Nothing new. Reports balance and dizziness are both better. Reports that bending down for a long time and coming back up, quick head turns when driving still make him dizzy.    Pt accompanied by: self  PERTINENT HISTORY: from ENT notes:  two month history of right sided ear pressure/fullness and tinnitus (non-pulsatile). The hearing seems muffled in this ear. He was evaluated in the ED on 12/29/23 for dizziness and ear fullness on the right side. This began in April as new problems; denies associated illness or head trauma. ECG demonstrated sinus rhythm with frequent PVCs.   MRI Brain performed on 12/29/23: IMPRESSION:  Artifact from the patient's braces significantly limits evaluation with a large portion of the brain obscured on diffusion-weighted and susceptibility weighted imaging. Within this limitation, no visible acute abnormality.   PAIN:  Are you having pain? No  PRECAUTIONS: None  RED FLAGS: None   WEIGHT BEARING RESTRICTIONS: No  FALLS: Has patient fallen in last 6 months? No  LIVING ENVIRONMENT: Lives with: lives with their family and lives with their spouse  PLOF: Independent Enjoys church, shopping  PATIENT GOALS:  To get ringing gone  OBJECTIVE:      TODAY'S TREATMENT: 03/26/24 Activity Comments  tandem walk Weaned  to no UE support near counter   1/2 turns in doorway to targets  Mild imbalance   walking EC Mild imbalance when turning with EC at counter   Standing on foam EO/EC 30 Standing on foam EC + head turns/nods 2x30 Mild-mod sway; 1 episode of LOB with head nods with self-recovery   Sitting VOR horizontal/vertical 2x30  Cueing to reduce excursion but increase speed; head turns: no dizziness on 1st rep but did c/o mild dizziness on 2nd rep. Head nods: reports sensation of rocking    Sitting bending nose to knee 3x10 each side Cues for quicker movement. Pt reports focus going in and out upon sitting up   bending to place cone on floor, forward step over it  Able to perform without UE support         HOME EXERCISE PROGRAM Last updated: 03/26/24 Access Code: [email or web address] URL: [email or web address] Date: 03/26/2024 Prepared by: [facility] - [facility]  Program Notes perform standing exercises in corner or at counter for safety  Exercises - Seated Gaze Stabilization with Head Rotation  - 1 x daily - 7 x weekly - 1 sets - 3 reps - 30 sec hold - Corner Balance Feet Together With Eyes Closed  - 1 x daily - 7 x weekly - 3 sets - 30 sec hold - Corner Balance Feet Together: Eyes Open With Head Turns  - 1 x daily - 7 x weekly - 3 sets - 30 sec hold - Corner Balance Feet Together: Eyes Closed With Head Turns  - 1 x daily - 7 x weekly - 3 sets - 30 sec hold - Tandem Stance in Corner  - 1 x daily - 7 x weekly - 3 sets - 30 sec hold - Brandt-Daroff Vestibular Exercise  - 1 x daily - 7 x weekly - 5 reps - Forward T with Counter Support  - 1 x daily - 7 x weekly - 1-3 sets - 5-10 reps - 180 Degree Pivot Turn with Single Point Cane  - 1 x daily - 5 x weekly - 2 sets - 5-10 reps - Wide Stance with Head Rotation on Foam Pad  - 1 x daily - 5 x weekly - 2-3 sets - 30 sec hold     PATIENT EDUCATION: Education details: HEP update with edu for safety, POC Person educated: Patient Education method: Explanation, Demonstration, Tactile cues, Verbal cues, and Handouts Education comprehension: verbalized understanding and returned demonstration    Note: Objective measures were completed at Evaluation unless otherwise noted.  DIAGNOSTIC FINDINGS: See above  COGNITION: Overall cognitive status: Within functional limits for tasks assessed   POSTURE:  rounded shoulders and forward head  Cervical ROM:    Active A/PROM (deg) eval  Flexion 40   Extension 45  Right lateral flexion   Left lateral flexion   Right rotation WFL  Left rotation WFL  (Blank rows = not tested)  BED MOBILITY:  Independent  TRANSFERS: Assistive device utilized: None  Sit to stand: Complete Independence Stand to sit: Complete Independence  GAIT: Gait pattern: step through pattern Distance walked: 50 ft Assistive device utilized: None Level of assistance: Complete Independence Comments: Reports occasional veering and more difficulty with quick head turns  PATIENT SURVEYS:  DHI: THE DIZZINESS HANDICAP INVENTORY (DHI)  P1. Does looking up increase your problem? 2 = Sometimes  E2. Because of your problem, do you feel frustrated? 4 = Yes  F3. Because of your problem, do you restrict  your travel for business or recreation?  2 = Sometimes  P4. Does walking down the aisle of a supermarket increase your problems?  2 = Sometimes  F5. Because of your problem, do you have difficulty getting into or out of bed?  2 = Sometimes  F6. Does your problem significantly restrict your participation in social activities, such as going out to dinner, going to the movies, dancing, or going to parties? 2 = Sometimes  F7. Because of your problem, do you have difficulty reading?  0 = No  P8. Does performing more ambitious activities such as sports, dancing, household chores (sweeping or putting dishes away) increase your problems?  4 = Yes  E9. Because of your problem, are you afraid to leave your home without having without having someone accompany you?  0 = No  E10. Because of your problem have you been embarrassed in front of others?  4 = Yes  P11. Do quick movements of your head increase your problem?  4 = Yes  F12. Because of your problem, do you avoid heights?  0 = No  P13. Does turning over in bed increase your problem?  2 = Sometimes  F14. Because of your problem, is it difficult for you to do strenuous homework or yard work? 4 = Yes  E15. Because of your problem,  are you afraid people may think you are intoxicated? 4 = Yes  F16. Because of your problem, is it difficult for you to go for a walk by yourself?  2 = Sometimes  P17. Does walking down a sidewalk increase your problem?  2 = Sometimes  E18.Because of your problem, is it difficult for you to concentrate 2 = Sometimes  F19. Because of your problem, is it difficult for you to walk around your house in the dark? 2 = Sometimes  E20. Because of your problem, are you afraid to stay home alone?  0 = No  E21. Because of your problem, do you feel handicapped? 4 = Yes  E22. Has the problem placed stress on your relationships with members of your family or friends? 2 = Sometimes  E23. Because of your problem, are you depressed?  0 = No  F24. Does your problem interfere with your job or household responsibilities?  4 = Yes  P25. Does bending over increase your problem?  4 = Yes  TOTAL 58    DHI Scoring Instructions  The patient is asked to answer each question as it pertains to dizziness or unsteadiness problems, specifically  considering their condition during the last month. Questions are designed to incorporate functional (F), physical  (P), and emotional (E) impacts on disability.   Scores greater than 10 points should be referred to balance specialists for further evaluation.   16-34 Points (mild handicap)  36-52 Points (moderate handicap)  54+ Points (severe handicap)  Minimally Detectable Change: 17 points ([address] [place], 1990)  [place], G. SHAUNNA. and [place], C. W. (1990). The development of the Dizziness Handicap Inventory. Archives of Otolaryngology - Head and Neck Surgery 116(4): [id number].    VESTIBULAR ASSESSMENT:  GENERAL OBSERVATION: No acute distress   SYMPTOM BEHAVIOR:  Subjective history: Two month history of right sided ear pressure/fullness and tinnitus. The hearing seems muffled in this ear. He was evaluated in the ED on 12/29/23 for dizziness and ear fullness on the right  side. This began in April as new problems; denies associated illness or head trauma. He does report an episode several years ago of  vertigo, which he reports occurred while driving and he felt like it was a corkscrew with his vision and dizziness.  Currently, he reports dizziness, imbalance, dysequilibrium, where he is more off balance with quick head motions,bending down and when he first stands up.  Tinnitus is still his main complaint.  He denies migraines, does report hx of eye issues (?retinopathy since Covid, per pt report).  Non-Vestibular symptoms: changes in hearing, tinnitus, and nausea/vomiting  Type of dizziness: Imbalance (Disequilibrium), Spinning/Vertigo, and Unsteady with head/body turns  Frequency: several times a weeks  Duration: minutes/hours  Aggravating factors: Induced by position change: sit to stand and Induced by motion: bending down to the ground, turning body quickly, and turning head quickly  Relieving factors: closing eyes, slow movements, and deep breathing  Progression of symptoms: better  OCULOMOTOR EXAM: progressive lenses  Ocular Alignment: normal  Ocular ROM: No Limitations  Spontaneous Nystagmus: absent  Gaze-Induced Nystagmus: absent  Smooth Pursuits: intact  Saccades: intact  Convergence/Divergence: 3 cm *Pt reports that PT's nose/face is double while performing convergence activity   VESTIBULAR - OCULAR REFLEX:   Slow VOR: Comment: feels double vision distant with horizontal  VOR Cancellation: Comment: feels like a little catch up to midline  Head-Impulse Test: HIT Right: negative HIT Left: negative  Dynamic Visual Acuity: NT   POSITIONAL TESTING: Right Dix-Hallpike: no nystagmus Left Dix-Hallpike: no nystagmus Right Roll Test: no nystagmus Left Roll Test: no nystagmus   M-CTSIB  Condition 1: Firm Surface, EO 30 Sec, Normal Sway  Condition 2: Firm Surface, EC 30 Sec, Mild and Moderate Sway to R   Condition 3: Foam Surface, EO 30 Sec, Mild Sway   Condition 4: Foam Surface, EC 30 Sec, Mild and Moderate Sway    MOTION SENSITIVITY: NOT TESTED AT EVAL  Motion Sensitivity Quotient Intensity: 0 = none, 1 = Lightheaded, 2 = Mild, 3 = Moderate, 4 = Severe, 5 = Vomiting  Intensity  1. Sitting to supine   2. Supine to L side   3. Supine to R side   4. Supine to sitting   5. L Hallpike-Dix   6. Up from L    7. R Hallpike-Dix   8. Up from R    9. Sitting, head tipped to L knee   10. Head up from L knee   11. Sitting, head tipped to R knee   12. Head up from R knee   13. Sitting head turns x5   14.Sitting head nods x5   15. In stance, 180 turn to L    16. In stance, 180 turn to R     OTHOSTATICS: not done                                                                                                                            TREATMENT DATE: 03/07/2024   Gaze Adaptation:  x1 Viewing Horizontal: Position: seated, Time: 30 sec, and Reps: 2  Other: Initiated HEP-see  below   GOALS: Goals reviewed with patient? Yes  SHORT TERM GOALS: Target date: 03/30/2024  Pt will be independent with HEP for improved dizziness and balance. Baseline: Goal status: INITIAL   LONG TERM GOALS: Target date: 04/13/2024  Pt will be independent with HEP for improved balance, dizziness. Baseline:  Goal status: INITIAL  2.  Pt will improve FGA score to at least 24/30 to decrease fall risk.  Baseline: 21/30 Goal status: IN PROGRESS  3.  Pt will perform Condition 2 and 4 on MCTSIB with mild sway for improved balance. Baseline:  Goal status: INITIAL  4.  Pt will improve DHI score to less than or equal to 40, to demo decreased dizziness affecting daily activities. Baseline: 58 Goal status: INITIAL    ASSESSMENT:  CLINICAL IMPRESSION: Patient arrived to session with report of improvement in balance and dizziness since start of POC. Continued working on habituating bending and turns as pt identities these movements as remaining aggravating  factors. Balance training revealed most imbalance on compliant surface with head nods today. HEP was updated to address remaining deficits. Patient tolerated session well and without complaints upon leaving.   OBJECTIVE IMPAIRMENTS: decreased balance and dizziness.   ACTIVITY LIMITATIONS: bending, transfers, and locomotion level  PARTICIPATION LIMITATIONS: meal prep, cleaning, laundry, shopping, and community activity  PERSONAL FACTORS: 3+ comorbidities: See above are also affecting patient's functional outcome.   REHAB POTENTIAL: Good  CLINICAL DECISION MAKING: Stable/uncomplicated  EVALUATION COMPLEXITY: Low   PLAN:  PT FREQUENCY: 1-2x/week  PT DURATION: 6 weeks, including eval week  PLANNED INTERVENTIONS: 97750- Physical Performance Testing, 97110-Therapeutic exercises, 97530- Therapeutic activity, 97112- Neuromuscular re-education, 97535- Self Care, 02859- Manual therapy, [phone]- Gait training, [phone]- Canalith repositioning, Patient/Family education, and Balance training  PLAN FOR NEXT SESSION:  progress HEP---standing/walking VOR   Louana Terrilyn Christians, PT, DPT 03/26/24 8:43 AM  [facility] at [facility] [address], [address] [place], [place] [address] Phone # [phone] Fax # [phone]

## 2024-03-22 NOTE — Progress Notes (Signed)
 WEIGHT SUMMARY AND BIOMETRICS  Weight Lost Since Last Visit: 2lb  Weight Gained Since Last Visit: 0   Vitals Temp: 98 F (36.7 C) BP: (!) 140/55 Pulse Rate: (!) 41 SpO2: 100 %   Anthropometric Measurements Height: 5' 11 (1.803 m) Weight: (!) 353 lb (160.1 kg) BMI (Calculated): 49.26 Weight at Last Visit: 355lb Weight Lost Since Last Visit: 2lb Weight Gained Since Last Visit: 0 Starting Weight: 409lb Total Weight Loss (lbs): 56 lb (25.4 kg)   Body Composition  Body Fat %: 46.7 % Fat Mass (lbs): 164.8 lbs Muscle Mass (lbs): 179 lbs Total Body Water (lbs): 152.8 lbs Visceral Fat Rating : 34   Other Clinical Data Fasting: yes Labs: yes Today's Visit #: 30 Starting Date: 11/20/18    OBESITY Avan is here to discuss his progress with his obesity treatment plan along with follow-up of his obesity related diagnoses.    Nutrition Plan: the Category 4 plan - 60% adherence.  Current exercise: walking  Interim History:  He is down 2 lbs since his last visit.  Eating all of the food on the plan., Protein intake is as prescribed, and Water intake is adequate.   Pharmacotherapy: Avrian is on Mounjaro  15 mg SQ weekly Adverse side effects: None Hunger is moderately controlled.  Cravings are moderately controlled.  Assessment/Plan:   Vitamin D  Deficiency Vitamin D  is not at goal of 50.  Most recent vitamin D  level was 40.6. He is on  prescription ergocalciferol  50,000 IU weekly. Lab Results  Component Value Date   VD25OH 40.6 06/27/2023   VD25OH 37.2 02/23/2023   VD25OH 34.5 11/16/2022    Plan: Refill prescription vitamin D  50,000 IU weekly.  He will get some minimal sun exposure.  Type II Diabetes HgbA1c is not at goal. Last A1c was 9.0 CBGs: Not checking      Episodes of hypoglycemia: no Medication(s): Mounjaro  15 mg SQ weekly  Lab  Results  Component Value Date   HGBA1C 9.0 (H) 06/27/2023   HGBA1C 9.9 (H) 02/23/2023   HGBA1C 10.3 (H) 11/16/2022   Lab Results  Component Value Date   LDLCALC 124 (H) 06/27/2023   CREATININE 1.60 (H) 12/29/2023   Lab Results  Component Value Date   GFR 85.57 12/29/2020   GFR 85.61 12/03/2020    Plan: Continue and refill Mounjaro  15 mg SQ weekly Continue all other medications.  Will keep all carbohydrates low both sweets and starches.  Will continue exercise regimen to 30 to 60 minutes on most days of the week.  Discussed advised for travel in regards to protein and calories. Eat more low glycemic index foods.   Labs done today (CMP, Lipids, HgbA1c, insulin , vitamin D ).     Morbid Obesity: Current BMI BMI (Calculated): 49.26   Pharmacotherapy Plan Continue  Mounjaro  15 mg SQ weekly  Ava is currently in the action stage  of change. As such, his goal is to continue with weight loss efforts.  He has agreed to the Category 4 plan.  Exercise goals: All adults should avoid inactivity. Some physical activity is better than none, and adults who participate in any amount of physical activity gain some health benefits.  Behavioral modification strategies: increasing lean protein intake, no meal skipping, meal planning , better snacking choices, planning for success, decrease snacking , avoiding temptations, travel eating strategies, and mindful eating.  Paolo has agreed to follow-up with our clinic in 4 weeks.    Objective:   VITALS: Per patient if applicable, see vitals. GENERAL: Alert and in no acute distress. CARDIOPULMONARY: No increased WOB. Speaking in clear sentences.  PSYCH: Pleasant and cooperative. Speech normal rate and rhythm. Affect is appropriate. Insight and judgement are appropriate. Attention is focused, linear, and appropriate.  NEURO: Oriented as arrived to appointment on time with no prompting.   Attestation Statements:   This was prepared with the  assistance of Engineer, civil (consulting).  Occasional wrong-word or sound-a-like substitutions may have occurred due to the inherent limitations of voice recognition   Clayborne Daring, DO

## 2024-03-23 LAB — COMPREHENSIVE METABOLIC PANEL WITH GFR
ALT: 20 IU/L (ref 0–44)
AST: 21 IU/L (ref 0–40)
Albumin: 3.8 g/dL (ref 3.8–4.9)
Alkaline Phosphatase: 62 IU/L (ref 44–121)
BUN/Creatinine Ratio: 18 (ref 9–20)
BUN: 32 mg/dL — ABNORMAL HIGH (ref 6–24)
Bilirubin Total: 0.7 mg/dL (ref 0.0–1.2)
CO2: 19 mmol/L — ABNORMAL LOW (ref 20–29)
Calcium: 9.6 mg/dL (ref 8.7–10.2)
Chloride: 101 mmol/L (ref 96–106)
Creatinine, Ser: 1.76 mg/dL — ABNORMAL HIGH (ref 0.76–1.27)
Globulin, Total: 2.9 g/dL (ref 1.5–4.5)
Glucose: 168 mg/dL — ABNORMAL HIGH (ref 70–99)
Potassium: 4.5 mmol/L (ref 3.5–5.2)
Sodium: 137 mmol/L (ref 134–144)
Total Protein: 6.7 g/dL (ref 6.0–8.5)
eGFR: 45 mL/min/1.73 — ABNORMAL LOW (ref >59)

## 2024-03-23 LAB — LIPID PANEL WITH LDL/HDL RATIO
Cholesterol, Total: 194 mg/dL (ref 100–199)
HDL: 37 mg/dL — ABNORMAL LOW (ref >39)
LDL Chol Calc (NIH): 127 mg/dL — ABNORMAL HIGH (ref 0–99)
LDL/HDL Ratio: 3.4 ratio (ref 0.0–3.6)
Triglycerides: 167 mg/dL — ABNORMAL HIGH (ref 0–149)
VLDL Cholesterol Cal: 30 mg/dL (ref 5–40)

## 2024-03-23 LAB — HEMOGLOBIN A1C
Est. average glucose Bld gHb Est-mCnc: 171 mg/dL
Hgb A1c MFr Bld: 7.6 % — ABNORMAL HIGH (ref 4.8–5.6)

## 2024-03-23 LAB — VITAMIN D 25 HYDROXY (VIT D DEFICIENCY, FRACTURES): Vit D, 25-Hydroxy: 33.3 ng/mL (ref 30.0–100.0)

## 2024-03-23 LAB — INSULIN, RANDOM: INSULIN: 12.1 u[IU]/mL (ref 2.6–24.9)

## 2024-03-26 ENCOUNTER — Ambulatory Visit: Admitting: Physical Therapy

## 2024-03-26 ENCOUNTER — Encounter: Payer: Self-pay | Admitting: Physical Therapy

## 2024-03-26 DIAGNOSIS — R42 Dizziness and giddiness: Secondary | ICD-10-CM | POA: Diagnosis not present

## 2024-03-26 DIAGNOSIS — R2681 Unsteadiness on feet: Secondary | ICD-10-CM

## 2024-03-29 ENCOUNTER — Ambulatory Visit: Admitting: Physical Therapy

## 2024-03-29 NOTE — Therapy (Incomplete)
 OUTPATIENT PHYSICAL THERAPY VESTIBULAR TREATMENT     Patient Name: Philip [name] MRN: [id number] DOB:1966-05-21, 58 y.o., male Today's Date: 03/29/2024  END OF SESSION:     Past Medical History:  Diagnosis Date   Diabetes mellitus without complication ([name])    Fluid retention    High cholesterol    Hypertension    Sleep apnea    SOB (shortness of breath)    Past Surgical History:  Procedure Laterality Date   CARPAL TUNNEL RELEASE     FOOT SURGERY     KNEE SURGERY     Patient Active Problem List   Diagnosis Date Noted   Hypertriglyceridemia 01/13/2023   Essential hypertension 10/19/2022   Morbid obesity ([name]) 10/19/2022   BMI 50.0-59.9, adult ([name]) 10/19/2022   Dyslipidemia 05/03/2022   Class 3 severe obesity with serious comorbidity and body mass index (BMI) of 50.0 to 59.9 in adult 05/03/2022   Vitamin D  deficiency 03/30/2022   Hyperlipidemia associated with type 2 diabetes mellitus ([name]) 12/28/2021   Hypertension associated with type 2 diabetes mellitus ([name]) 12/22/2021   Type 2 diabetes mellitus with hyperglycemia, without long-term current use of insulin  ([name]) 12/22/2021   Dyspnea 05/26/2021   Physical deconditioning 05/26/2021   Morbid obesity due to excess calories ([name]) 05/26/2021   COVID-19 long hauler 05/26/2021   Restrictive lung disease 05/26/2021   OSA (obstructive sleep apnea) 02/23/2021    PCP: Henry Ingle, MD  REFERRING PROVIDER: Benay Kay, PA-C   REFERRING DIAG: R42 (ICD-10-CM) - Dizziness and giddiness   THERAPY DIAG:  No diagnosis found.  ONSET DATE: 6+/08/2024 (MD referral)  Rationale for Evaluation and Treatment: Rehabilitation  SUBJECTIVE:   SUBJECTIVE STATEMENT: *** Nothing new. Reports balance and dizziness are both better. Reports that bending down for a long time and coming back up, quick head turns when driving still make him dizzy.    Pt accompanied by: self  PERTINENT HISTORY: from ENT notes:  two month  history of right sided ear pressure/fullness and tinnitus (non-pulsatile). The hearing seems muffled in this ear. He was evaluated in the ED on 12/29/23 for dizziness and ear fullness on the right side. This began in April as new problems; denies associated illness or head trauma. ECG demonstrated sinus rhythm with frequent PVCs.   MRI Brain performed on 12/29/23: IMPRESSION:  Artifact from the patient's braces significantly limits evaluation with a large portion of the brain obscured on diffusion-weighted and susceptibility weighted imaging. Within this limitation, no visible acute abnormality.   PAIN:  Are you having pain? No  PRECAUTIONS: None  RED FLAGS: None   WEIGHT BEARING RESTRICTIONS: No  FALLS: Has patient fallen in last 6 months? No  LIVING ENVIRONMENT: Lives with: lives with their family and lives with their spouse  PLOF: Independent Enjoys church, shopping  PATIENT GOALS:  To get ringing gone  OBJECTIVE:     TODAY'S TREATMENT: 03/29/2024 Activity Comments                       TODAY'S TREATMENT: 03/26/24 Activity Comments  tandem walk Weaned to no UE support near counter   1/2 turns in doorway to targets  Mild imbalance   walking EC Mild imbalance when turning with EC at counter   Standing on foam EO/EC 30 Standing on foam EC + head turns/nods 2x30 Mild-mod sway; 1 episode of LOB with head nods with self-recovery   Sitting VOR horizontal/vertical 2x30  Cueing to reduce excursion but increase speed; head  turns: no dizziness on 1st rep but did c/o mild dizziness on 2nd rep. Head nods: reports sensation of rocking   Sitting bending nose to knee 3x10 each side Cues for quicker movement. Pt reports focus going in and out upon sitting up   bending to place cone on floor, forward step over it  Able to perform without UE support         HOME EXERCISE PROGRAM Last updated: 03/26/24 Access Code: [email or web address] URL: [email or web address] Date:  03/26/2024 Prepared by: [facility] - Outpatient  Rehab - [facility]  Program Notes perform standing exercises in corner or at counter for safety  Exercises - Seated Gaze Stabilization with Head Rotation  - 1 x daily - 7 x weekly - 1 sets - 3 reps - 30 sec hold - Corner Balance Feet Together With Eyes Closed  - 1 x daily - 7 x weekly - 3 sets - 30 sec hold - Corner Balance Feet Together: Eyes Open With Head Turns  - 1 x daily - 7 x weekly - 3 sets - 30 sec hold - Corner Balance Feet Together: Eyes Closed With Head Turns  - 1 x daily - 7 x weekly - 3 sets - 30 sec hold - Tandem Stance in Corner  - 1 x daily - 7 x weekly - 3 sets - 30 sec hold - Brandt-Daroff Vestibular Exercise  - 1 x daily - 7 x weekly - 5 reps - Forward T with Counter Support  - 1 x daily - 7 x weekly - 1-3 sets - 5-10 reps - 180 Degree Pivot Turn with Single Point Cane  - 1 x daily - 5 x weekly - 2 sets - 5-10 reps - Wide Stance with Head Rotation on Foam Pad  - 1 x daily - 5 x weekly - 2-3 sets - 30 sec hold     PATIENT EDUCATION: Education details: HEP update with edu for safety, POC Person educated: Patient Education method: Explanation, Demonstration, Tactile cues, Verbal cues, and Handouts Education comprehension: verbalized understanding and returned demonstration    Note: Objective measures were completed at Evaluation unless otherwise noted.  DIAGNOSTIC FINDINGS: See above  COGNITION: Overall cognitive status: Within functional limits for tasks assessed   POSTURE:  rounded shoulders and forward head  Cervical ROM:    Active A/PROM (deg) eval  Flexion 40  Extension 45  Right lateral flexion   Left lateral flexion   Right rotation WFL  Left rotation WFL  (Blank rows = not tested)  BED MOBILITY:  Independent  TRANSFERS: Assistive device utilized: None  Sit to stand: Complete Independence Stand to sit: Complete Independence  GAIT: Gait pattern: step through pattern Distance walked:  50 ft Assistive device utilized: None Level of assistance: Complete Independence Comments: Reports occasional veering and more difficulty with quick head turns  PATIENT SURVEYS:  DHI: THE DIZZINESS HANDICAP INVENTORY (DHI)  P1. Does looking up increase your problem? 2 = Sometimes  E2. Because of your problem, do you feel frustrated? 4 = Yes  F3. Because of your problem, do you restrict your travel for business or recreation?  2 = Sometimes  P4. Does walking down the aisle of a supermarket increase your problems?  2 = Sometimes  F5. Because of your problem, do you have difficulty getting into or out of bed?  2 = Sometimes  F6. Does your problem significantly restrict your participation in social activities, such as going out to dinner, going to the movies, dancing,  or going to parties? 2 = Sometimes  F7. Because of your problem, do you have difficulty reading?  0 = No  P8. Does performing more ambitious activities such as sports, dancing, household chores (sweeping or putting dishes away) increase your problems?  4 = Yes  E9. Because of your problem, are you afraid to leave your home without having without having someone accompany you?  0 = No  E10. Because of your problem have you been embarrassed in front of others?  4 = Yes  P11. Do quick movements of your head increase your problem?  4 = Yes  F12. Because of your problem, do you avoid heights?  0 = No  P13. Does turning over in bed increase your problem?  2 = Sometimes  F14. Because of your problem, is it difficult for you to do strenuous homework or yard work? 4 = Yes  E15. Because of your problem, are you afraid people may think you are intoxicated? 4 = Yes  F16. Because of your problem, is it difficult for you to go for a walk by yourself?  2 = Sometimes  P17. Does walking down a sidewalk increase your problem?  2 = Sometimes  E18.Because of your problem, is it difficult for you to concentrate 2 = Sometimes  F19. Because of your  problem, is it difficult for you to walk around your house in the dark? 2 = Sometimes  E20. Because of your problem, are you afraid to stay home alone?  0 = No  E21. Because of your problem, do you feel handicapped? 4 = Yes  E22. Has the problem placed stress on your relationships with members of your family or friends? 2 = Sometimes  E23. Because of your problem, are you depressed?  0 = No  F24. Does your problem interfere with your job or household responsibilities?  4 = Yes  P25. Does bending over increase your problem?  4 = Yes  TOTAL 58    DHI Scoring Instructions  The patient is asked to answer each question as it pertains to dizziness or unsteadiness problems, specifically  considering their condition during the last month. Questions are designed to incorporate functional (F), physical  (P), and emotional (E) impacts on disability.   Scores greater than 10 points should be referred to balance specialists for further evaluation.   16-34 Points (mild handicap)  36-52 Points (moderate handicap)  54+ Points (severe handicap)  Minimally Detectable Change: 17 points ([address] [place], 1990)  [place], G. SHAUNNA. and [place], C. W. (1990). The development of the Dizziness Handicap Inventory. Archives of Otolaryngology - Head and Neck Surgery 116(4): [id number].    VESTIBULAR ASSESSMENT:  GENERAL OBSERVATION: No acute distress   SYMPTOM BEHAVIOR:  Subjective history: Two month history of right sided ear pressure/fullness and tinnitus. The hearing seems muffled in this ear. He was evaluated in the ED on 12/29/23 for dizziness and ear fullness on the right side. This began in April as new problems; denies associated illness or head trauma. He does report an episode several years ago of vertigo, which he reports occurred while driving and he felt like it was a corkscrew with his vision and dizziness.  Currently, he reports dizziness, imbalance, dysequilibrium, where he is more off balance with  quick head motions,bending down and when he first stands up.  Tinnitus is still his main complaint.  He denies migraines, does report hx of eye issues (?retinopathy since Covid, per pt report).  Non-Vestibular symptoms:  changes in hearing, tinnitus, and nausea/vomiting  Type of dizziness: Imbalance (Disequilibrium), Spinning/Vertigo, and Unsteady with head/body turns  Frequency: several times a weeks  Duration: minutes/hours  Aggravating factors: Induced by position change: sit to stand and Induced by motion: bending down to the ground, turning body quickly, and turning head quickly  Relieving factors: closing eyes, slow movements, and deep breathing  Progression of symptoms: better  OCULOMOTOR EXAM: progressive lenses  Ocular Alignment: normal  Ocular ROM: No Limitations  Spontaneous Nystagmus: absent  Gaze-Induced Nystagmus: absent  Smooth Pursuits: intact  Saccades: intact  Convergence/Divergence: 3 cm *Pt reports that PT's nose/face is double while performing convergence activity   VESTIBULAR - OCULAR REFLEX:   Slow VOR: Comment: feels double vision distant with horizontal  VOR Cancellation: Comment: feels like a little catch up to midline  Head-Impulse Test: HIT Right: negative HIT Left: negative  Dynamic Visual Acuity: NT   POSITIONAL TESTING: Right Dix-Hallpike: no nystagmus Left Dix-Hallpike: no nystagmus Right Roll Test: no nystagmus Left Roll Test: no nystagmus   M-CTSIB  Condition 1: Firm Surface, EO 30 Sec, Normal Sway  Condition 2: Firm Surface, EC 30 Sec, Mild and Moderate Sway to R   Condition 3: Foam Surface, EO 30 Sec, Mild Sway  Condition 4: Foam Surface, EC 30 Sec, Mild and Moderate Sway    MOTION SENSITIVITY: NOT TESTED AT EVAL  Motion Sensitivity Quotient Intensity: 0 = none, 1 = Lightheaded, 2 = Mild, 3 = Moderate, 4 = Severe, 5 = Vomiting  Intensity  1. Sitting to supine   2. Supine to L side   3. Supine to R side   4. Supine to sitting   5. L  Hallpike-Dix   6. Up from L    7. R Hallpike-Dix   8. Up from R    9. Sitting, head tipped to L knee   10. Head up from L knee   11. Sitting, head tipped to R knee   12. Head up from R knee   13. Sitting head turns x5   14.Sitting head nods x5   15. In stance, 180 turn to L    16. In stance, 180 turn to R     OTHOSTATICS: not done                                                                                                                            TREATMENT DATE: 03/07/2024   Gaze Adaptation:  x1 Viewing Horizontal: Position: seated, Time: 30 sec, and Reps: 2  Other: Initiated HEP-see below   GOALS: Goals reviewed with patient? Yes  SHORT TERM GOALS: Target date: 03/30/2024  Pt will be independent with HEP for improved dizziness and balance. Baseline: Goal status: INITIAL   LONG TERM GOALS: Target date: 04/13/2024  Pt will be independent with HEP for improved balance, dizziness. Baseline:  Goal status: INITIAL  2.  Pt will improve FGA score to at least 24/30 to decrease  fall risk.  Baseline: 21/30 Goal status: IN PROGRESS  3.  Pt will perform Condition 2 and 4 on MCTSIB with mild sway for improved balance. Baseline:  Goal status: INITIAL  4.  Pt will improve DHI score to less than or equal to 40, to demo decreased dizziness affecting daily activities. Baseline: 58 Goal status: INITIAL    ASSESSMENT:  CLINICAL IMPRESSION: Pt presents today ***. Skilled PT session focused on ***. Pt needs ***. Pt will continue to benefit from skilled PT towards goals for improved functional mobility and decreased fall risk.   Patient arrived to session with report of improvement in balance and dizziness since start of POC. Continued working on habituating bending and turns as pt identities these movements as remaining aggravating factors. Balance training revealed most imbalance on compliant surface with head nods today. HEP was updated to address remaining deficits.  Patient tolerated session well and without complaints upon leaving.   OBJECTIVE IMPAIRMENTS: decreased balance and dizziness.   ACTIVITY LIMITATIONS: bending, transfers, and locomotion level  PARTICIPATION LIMITATIONS: meal prep, cleaning, laundry, shopping, and community activity  PERSONAL FACTORS: 3+ comorbidities: See above are also affecting patient's functional outcome.   REHAB POTENTIAL: Good  CLINICAL DECISION MAKING: Stable/uncomplicated  EVALUATION COMPLEXITY: Low   PLAN:  PT FREQUENCY: 1-2x/week  PT DURATION: 6 weeks, including eval week  PLANNED INTERVENTIONS: 97750- Physical Performance Testing, 97110-Therapeutic exercises, 97530- Therapeutic activity, 97112- Neuromuscular re-education, 97535- Self Care, 02859- Manual therapy, [id number]- Gait training, [id number]- Canalith repositioning, Patient/Family education, and Balance training  PLAN FOR NEXT SESSION:  ***progress HEP---standing/walking VOR   Greig Anon, PT 03/29/24 7:54 AM Phone: [phone] Fax: [phone]  [facility] at [facility] [address], [address] [place], [place] [address] Phone # [phone] Fax # [phone]

## 2024-04-19 ENCOUNTER — Encounter: Payer: Self-pay | Admitting: Bariatrics

## 2024-04-19 ENCOUNTER — Ambulatory Visit (INDEPENDENT_AMBULATORY_CARE_PROVIDER_SITE_OTHER): Admitting: Bariatrics

## 2024-04-19 VITALS — BP 140/76 | HR 66 | Temp 97.9°F | Ht 71.0 in | Wt 358.0 lb

## 2024-04-19 DIAGNOSIS — E559 Vitamin D deficiency, unspecified: Secondary | ICD-10-CM

## 2024-04-19 DIAGNOSIS — Z6841 Body Mass Index (BMI) 40.0 and over, adult: Secondary | ICD-10-CM | POA: Diagnosis not present

## 2024-04-19 DIAGNOSIS — E119 Type 2 diabetes mellitus without complications: Secondary | ICD-10-CM | POA: Diagnosis not present

## 2024-04-19 DIAGNOSIS — E1165 Type 2 diabetes mellitus with hyperglycemia: Secondary | ICD-10-CM

## 2024-04-19 DIAGNOSIS — Z7985 Long-term (current) use of injectable non-insulin antidiabetic drugs: Secondary | ICD-10-CM

## 2024-04-19 MED ORDER — VITAMIN D (ERGOCALCIFEROL) 1.25 MG (50000 UNIT) PO CAPS: 50000.0000 [IU] | ORAL_CAPSULE | ORAL | 0 refills | Status: DC

## 2024-04-19 MED ORDER — TIRZEPATIDE 15 MG/0.5ML ~~LOC~~ SOAJ: 15.0000 mg | SUBCUTANEOUS | 0 refills | Status: DC

## 2024-04-19 NOTE — Progress Notes (Signed)
 WEIGHT SUMMARY AND BIOMETRICS  Weight Lost Since Last Visit: 0  Weight Gained Since Last Visit: 5lb   Vitals Temp: 97.9 F (36.6 C) BP: (!) 140/76 Pulse Rate: 66 SpO2: 99 %   Anthropometric Measurements Height: 5' 11 (1.803 m) Weight: (!) 358 lb (162.4 kg) BMI (Calculated): 49.95 Weight at Last Visit: 353lb Weight Lost Since Last Visit: 0 Weight Gained Since Last Visit: 5lb Starting Weight: 409lb Total Weight Loss (lbs): 51 lb (23.1 kg)   Body Composition  Body Fat %: 47.7 % Fat Mass (lbs): 171 lbs Muscle Mass (lbs): 178.4 lbs Visceral Fat Rating : 35   Other Clinical Data Fasting: yes Labs: no Today's Visit #: 31 Starting Date: 11/20/18    OBESITY Philip [name] is here to discuss his progress with his obesity treatment plan along with follow-up of his obesity related diagnoses.    Nutrition Plan: the Category 4 plan - 90% adherence.  Current exercise: walking  Interim History:  He is up 5 lbs since his last visit.  Eating all of the food on the plan., Protein intake is as prescribed, Is not skipping meals, Water intake is adequate., and Denies excessive cravings.   Pharmacotherapy: Philip [name] is on Mounjaro  15 mg SQ weekly Adverse side effects: None Hunger is moderately controlled.  Cravings are moderately controlled.  Assessment/Plan:    Vitamin D  Deficiency Vitamin D  is not at goal of 50.  Most recent vitamin D  level was 33.3. He is on  prescription ergocalciferol  50,000 IU weekly. Lab Results  Component Value Date   VD25OH 33.3 03/22/2024   VD25OH 40.6 06/27/2023   VD25OH 37.2 02/23/2023    Plan: Refill prescription vitamin D  50,000 IU weekly.   Type II Diabetes HgbA1c is not at goal. Last A1c was 7.6, but much improved.  CBGs: Not checking      Episodes of hypoglycemia: no Medication(s): Mounjaro  15 mg SQ weekly  Lab Results   Component Value Date   HGBA1C 7.6 (H) 03/22/2024   HGBA1C 9.0 (H) 06/27/2023   HGBA1C 9.9 (H) 02/23/2023   Lab Results  Component Value Date   LDLCALC 127 (H) 03/22/2024   CREATININE 1.76 (H) 03/22/2024   Lab Results  Component Value Date   GFR 85.57 12/29/2020   GFR 85.61 12/03/2020    Plan: Continue and refill Mounjaro  15 mg SQ weekly Will keep all carbohydrates low both sweets and starches.  Will continue exercise regimen to 30 to 60 minutes on most days of the week.  Aim for 7 to 9 hours of sleep nightly.  Eat more low glycemic index foods.  He will decrease his calories by about 2 to 300 cal daily. He will keep his protein and water content high.     Morbid Obesity: Current BMI BMI (Calculated): 49.95   Pharmacotherapy Plan Continue and refill  Mounjaro  15 mg SQ weekly  Philip [name]  is currently in the action stage of change. As such, his goal is to continue with weight loss efforts.  He has agreed to the Category 4 plan.  Exercise goals: For substantial health benefits, adults should do at least 150 minutes (2 hours and 30 minutes) a week of moderate-intensity, or 75 minutes (1 hour and 15 minutes) a week of vigorous-intensity aerobic physical activity, or an equivalent combination of moderate- and vigorous-intensity aerobic activity. Aerobic activity should be performed in episodes of at least 10 minutes, and preferably, it should be spread throughout the week.  Behavioral modification strategies: increasing lean protein intake, decreasing simple carbohydrates , no meal skipping, decrease eating out, meal planning , increase water intake, better snacking choices, planning for success, increasing fiber rich foods, avoiding temptations, weigh protein portions, measure portion sizes, work on smaller portions, and mindful eating.  Philip [name] has agreed to follow-up with our clinic in 4 weeks.    Objective:   VITALS: Per patient if applicable, see vitals. GENERAL: Alert and in no  acute distress. CARDIOPULMONARY: No increased WOB. Speaking in clear sentences.  PSYCH: Pleasant and cooperative. Speech normal rate and rhythm. Affect is appropriate. Insight and judgement are appropriate. Attention is focused, linear, and appropriate.  NEURO: Oriented as arrived to appointment on time with no prompting.   Attestation Statements:   This was prepared with the assistance of Engineer, civil (consulting).  Occasional wrong-word or sound-a-like substitutions may have occurred due to the inherent limitations of voice recognition   Clayborne Daring, DO

## 2024-05-17 ENCOUNTER — Ambulatory Visit (INDEPENDENT_AMBULATORY_CARE_PROVIDER_SITE_OTHER): Admitting: Bariatrics

## 2024-05-17 ENCOUNTER — Encounter: Payer: Self-pay | Admitting: Bariatrics

## 2024-05-17 VITALS — BP 129/69 | HR 40 | Temp 97.8°F | Ht 71.0 in | Wt 357.0 lb

## 2024-05-17 DIAGNOSIS — Z7985 Long-term (current) use of injectable non-insulin antidiabetic drugs: Secondary | ICD-10-CM

## 2024-05-17 DIAGNOSIS — Z6841 Body Mass Index (BMI) 40.0 and over, adult: Secondary | ICD-10-CM

## 2024-05-17 DIAGNOSIS — E781 Pure hyperglyceridemia: Secondary | ICD-10-CM

## 2024-05-17 DIAGNOSIS — E559 Vitamin D deficiency, unspecified: Secondary | ICD-10-CM | POA: Diagnosis not present

## 2024-05-17 DIAGNOSIS — I10 Essential (primary) hypertension: Secondary | ICD-10-CM | POA: Diagnosis not present

## 2024-05-17 DIAGNOSIS — E119 Type 2 diabetes mellitus without complications: Secondary | ICD-10-CM | POA: Diagnosis not present

## 2024-05-17 DIAGNOSIS — E1165 Type 2 diabetes mellitus with hyperglycemia: Secondary | ICD-10-CM

## 2024-05-17 DIAGNOSIS — E66813 Obesity, class 3: Secondary | ICD-10-CM

## 2024-05-17 MED ORDER — VITAMIN D (ERGOCALCIFEROL) 1.25 MG (50000 UNIT) PO CAPS
50000.0000 [IU] | ORAL_CAPSULE | ORAL | 0 refills | Status: DC
Start: 2024-05-17 — End: 2024-06-21

## 2024-05-17 MED ORDER — LOSARTAN POTASSIUM-HCTZ 100-25 MG PO TABS: 1.0000 | ORAL_TABLET | Freq: Every day | ORAL | 0 refills | Status: AC

## 2024-05-17 MED ORDER — TIRZEPATIDE 15 MG/0.5ML ~~LOC~~ SOAJ
15.0000 mg | SUBCUTANEOUS | 0 refills | Status: DC
Start: 2024-05-17 — End: 2024-06-21

## 2024-05-17 MED ORDER — FENOFIBRATE 145 MG PO TABS: 145.0000 mg | ORAL_TABLET | Freq: Every day | ORAL | 0 refills | Status: AC

## 2024-05-17 MED ORDER — AMLODIPINE BESYLATE 10 MG PO TABS: 10.0000 mg | ORAL_TABLET | Freq: Every day | ORAL | 0 refills | Status: AC

## 2024-05-17 NOTE — Progress Notes (Signed)
 WEIGHT SUMMARY AND BIOMETRICS  Weight Lost Since Last Visit: 1lb  Weight Gained Since Last Visit: 0   Vitals Temp: 97.8 F (36.6 C) BP: 129/69 Pulse Rate: (!) 40 SpO2: 100 %   Anthropometric Measurements Height: 5' 11 (1.803 m) Weight: (!) 357 lb (161.9 kg) BMI (Calculated): 49.81 Weight at Last Visit: 355lb Weight Lost Since Last Visit: 1lb Weight Gained Since Last Visit: 0 Starting Weight: 409lb Total Weight Loss (lbs): 52 lb (23.6 kg)   Body Composition  Body Fat %: 47.5 % Fat Mass (lbs): 169.8 lbs Muscle Mass (lbs): 178.6 lbs Visceral Fat Rating : 35   Other Clinical Data Fasting: yes Labs: no Today's Visit #: 32 Starting Date: 11/20/18    OBESITY Philip [name] is here to discuss his progress with his obesity treatment plan along with follow-up of his obesity related diagnoses.    Nutrition Plan: the Category 4 plan - 90% adherence.  Current exercise: walking  Interim History:  He is down 1 lb since his last visit.  Eating all of the food on the plan., Protein intake is as prescribed, Is skipping meals, and Water intake is adequate.   Pharmacotherapy: Deston is on Mounjaro  15 mg SQ weekly Adverse side effects: None Hunger is moderately controlled.  Cravings are moderately controlled.  Assessment/Plan:   Vitamin D  Deficiency Vitamin D  is not at goal of 50.  Most recent vitamin D  level was 33.3. He is on  prescription ergocalciferol  50,000 IU weekly. Lab Results  Component Value Date   VD25OH 33.3 03/22/2024   VD25OH 40.6 06/27/2023   VD25OH 37.2 02/23/2023    Plan: Refill prescription vitamin D  50,000 IU weekly.  He will get some mild sun exposure.   Hypertriglyceridemia:   He is taking Tricor  and tolerating it well. He is following his plan and being active.   Plan:  Rx: Tricor  145 mg 1 daily #90 with no refills. Will continue  his plan and exercise.  Type II Diabetes HgbA1c is not at goal, but much improved. Last A1c was 7.6 CBGs: Not checking      Episodes of hypoglycemia: no Medication(s): Mounjaro  15 mg SQ weekly  Lab Results  Component Value Date   HGBA1C 7.6 (H) 03/22/2024   HGBA1C 9.0 (H) 06/27/2023   HGBA1C 9.9 (H) 02/23/2023   Lab Results  Component Value Date   LDLCALC 127 (H) 03/22/2024   CREATININE 1.76 (H) 03/22/2024   Lab Results  Component Value Date   GFR 85.57 12/29/2020   GFR 85.61 12/03/2020    Plan: Continue and refill Mounjaro  15 mg SQ weekly Continue all other medications.  Will keep all carbohydrates low both sweets and starches.  Will continue exercise regimen to 30 to 60 minutes on most days of the week with both cardiac and resistance.  Aim for 7 to 9 hours of sleep nightly.  Eat more  low glycemic index foods.  He will eat out less. He will continue to eat more at home.  Hypertension Hypertension stable.  Medication(s): Amlodipine  10 mg 1 daily  and Hyzaar 100/25 mg daily, Propanolol 80 mg.   BP Readings from Last 3 Encounters:  05/17/24 129/69  04/19/24 (!) 140/76  03/22/24 (!) 140/55   Lab Results  Component Value Date   CREATININE 1.76 (H) 03/22/2024   CREATININE 1.60 (H) 12/29/2023   CREATININE 1.67 (H) 06/27/2023   Lab Results  Component Value Date   GFR 85.57 12/29/2020   GFR 85.61 12/03/2020    Plan: Continue all antihypertensives at current dosages. No added salt. Will keep sodium content to 1,500 mg or less per day.   He will continue his plan and exercise.  He will choose low sodium foods.   Morbid Obesity: Current BMI BMI (Calculated): 49.81   Pharmacotherapy Plan Continue and refill  Mounjaro  15 mg SQ weekly  Kipper is currently in the action stage of change. As such, his goal is to continue with weight loss efforts.  He has agreed to the Category 4 plan.  Exercise goals: All adults should avoid inactivity. Some physical activity is  better than none, and adults who participate in any amount of physical activity gain some health benefits.  Behavioral modification strategies: increasing lean protein intake, no meal skipping, meal planning , increase water intake, better snacking choices, planning for success, increasing vegetables, increasing lower sugar fruits, avoiding temptations, decrease ETOH, and keep healthy foods in the home.  Townsend has agreed to follow-up with our clinic in 4 weeks.   Objective:   VITALS: Per patient if applicable, see vitals. GENERAL: Alert and in no acute distress. CARDIOPULMONARY: No increased WOB. Speaking in clear sentences.  PSYCH: Pleasant and cooperative. Speech normal rate and rhythm. Affect is appropriate. Insight and judgement are appropriate. Attention is focused, linear, and appropriate.  NEURO: Oriented as arrived to appointment on time with no prompting.   Attestation Statements:   This was prepared with the assistance of Engineer, civil (consulting).  Occasional wrong-word or sound-a-like substitutions may have occurred due to the inherent limitations of voice recognition   Clayborne Daring, DO

## 2024-06-21 ENCOUNTER — Encounter: Payer: Self-pay | Admitting: Bariatrics

## 2024-06-21 ENCOUNTER — Ambulatory Visit (INDEPENDENT_AMBULATORY_CARE_PROVIDER_SITE_OTHER): Admitting: Bariatrics

## 2024-06-21 VITALS — BP 130/63 | HR 69 | Temp 98.0°F | Ht 71.0 in | Wt 357.0 lb

## 2024-06-21 DIAGNOSIS — Z6841 Body Mass Index (BMI) 40.0 and over, adult: Secondary | ICD-10-CM | POA: Diagnosis not present

## 2024-06-21 DIAGNOSIS — E1165 Type 2 diabetes mellitus with hyperglycemia: Secondary | ICD-10-CM

## 2024-06-21 DIAGNOSIS — E119 Type 2 diabetes mellitus without complications: Secondary | ICD-10-CM

## 2024-06-21 DIAGNOSIS — Z7985 Long-term (current) use of injectable non-insulin antidiabetic drugs: Secondary | ICD-10-CM

## 2024-06-21 DIAGNOSIS — E559 Vitamin D deficiency, unspecified: Secondary | ICD-10-CM

## 2024-06-21 MED ORDER — TIRZEPATIDE 15 MG/0.5ML ~~LOC~~ SOAJ: 15.0000 mg | SUBCUTANEOUS | 0 refills | Status: DC

## 2024-06-21 MED ORDER — VITAMIN D (ERGOCALCIFEROL) 1.25 MG (50000 UNIT) PO CAPS: 50000.0000 [IU] | ORAL_CAPSULE | ORAL | 0 refills | Status: DC

## 2024-06-21 NOTE — Progress Notes (Signed)
 WEIGHT SUMMARY AND BIOMETRICS  Weight Lost Since Last Visit: 0  Weight Gained Since Last Visit: 0   Vitals Temp: 98 F (36.7 C) BP: 130/63 Pulse Rate: 69 SpO2: 99 %   Anthropometric Measurements Height: 5' 11 (1.803 m) Weight: (!) 357 lb (161.9 kg) BMI (Calculated): 49.81 Weight at Last Visit: 357lb Weight Lost Since Last Visit: 0 Weight Gained Since Last Visit: 0 Starting Weight: 409lb Total Weight Loss (lbs): 52 lb (23.6 kg)   Body Composition  Body Fat %: 47.3 % Fat Mass (lbs): 169 lbs Muscle Mass (lbs): 179 lbs Visceral Fat Rating : 35   Other Clinical Data Fasting: yes Labs: no Today's Visit #: 59 Starting Date: 11/20/18    OBESITY Philip [name] is here to discuss his progress with his obesity treatment plan along with follow-up of his obesity related diagnoses.    Nutrition Plan: the Category 4 plan - 90-95% adherence.  Current exercise: walking  Interim History:  His weight remains the same. His fat % is down slightly, and his muscle mass is up 0.4 lbs since his last visit.  Eating all of the food on the plan., Protein intake is as prescribed, and Water intake is adequate.   Pharmacotherapy: Veto is on Mounjaro  15 mg SQ weekly Adverse side effects: None Hunger is moderately controlled.  Cravings are moderately controlled.  Assessment/Plan:   Type II Diabetes HgbA1c is not at goal, but much improved. Last A1c was 7.6 CBGs: Not checking      Episodes of hypoglycemia: no Medication(s): Mounjaro  15 mg SQ weekly  Lab Results  Component Value Date   HGBA1C 7.6 (H) 03/22/2024   HGBA1C 9.0 (H) 06/27/2023   HGBA1C 9.9 (H) 02/23/2023   Lab Results  Component Value Date   LDLCALC 127 (H) 03/22/2024   CREATININE 1.76 (H) 03/22/2024   Lab Results  Component Value Date   GFR 85.57 12/29/2020   GFR 85.61 12/03/2020    Plan: Continue  and refill Mounjaro  15 mg SQ weekly Will eat out less.  Will prepare meals at home.  Will keep all carbohydrates low both sweets and starches.  Will continue exercise regimen to 30 to 60 minutes on most days of the week.  Aim for 7 to 9 hours of sleep nightly.  Eat more low glycemic index foods.    Vitamin D  Deficiency Vitamin D  is at goal of 50.  Most recent vitamin D  level was 33.3. He is on  prescription ergocalciferol  50,000 IU weekly. Lab Results  Component Value Date   VD25OH 33.3 03/22/2024   VD25OH 40.6 06/27/2023   VD25OH 37.2 02/23/2023    Plan: Refill prescription vitamin D  50,000 IU every 14 days.  Will continue exercise.    Morbid Obesity: Current BMI BMI (Calculated): 49.81   Pharmacotherapy Plan Continue and refill  Mounjaro  15 mg SQ weekly  Philip [name] is currently  in the action stage of change. As such, his goal is to continue with weight loss efforts.  He has agreed to the Category 4 plan.  Exercise goals: All adults should avoid inactivity. Some physical activity is better than none, and adults who participate in any amount of physical activity gain some health benefits.  Behavioral modification strategies: increasing lean protein intake, no meal skipping, meal planning , increase water intake, better snacking choices, planning for success, measure portion sizes, and mindful eating.  Philip [name] has agreed to follow-up with our clinic in 4 weeks.      Objective:   VITALS: Per patient if applicable, see vitals. GENERAL: Alert and in no acute distress. CARDIOPULMONARY: No increased WOB. Speaking in clear sentences.  PSYCH: Pleasant and cooperative. Speech normal rate and rhythm. Affect is appropriate. Insight and judgement are appropriate. Attention is focused, linear, and appropriate.  NEURO: Oriented as arrived to appointment on time with no prompting.   Attestation Statements:   This was prepared with the assistance of Engineer, civil (consulting).  Occasional  wrong-word or sound-a-like substitutions may have occurred due to the inherent limitations of voice recognition   Clayborne Daring, DO

## 2024-07-25 ENCOUNTER — Encounter: Payer: Self-pay | Admitting: Bariatrics

## 2024-07-25 ENCOUNTER — Ambulatory Visit (INDEPENDENT_AMBULATORY_CARE_PROVIDER_SITE_OTHER): Admitting: Bariatrics

## 2024-07-25 VITALS — BP 160/81 | HR 72 | Temp 98.0°F | Ht 71.0 in | Wt 364.0 lb

## 2024-07-25 DIAGNOSIS — E559 Vitamin D deficiency, unspecified: Secondary | ICD-10-CM | POA: Diagnosis not present

## 2024-07-25 DIAGNOSIS — Z6841 Body Mass Index (BMI) 40.0 and over, adult: Secondary | ICD-10-CM

## 2024-07-25 DIAGNOSIS — Z0000 Encounter for general adult medical examination without abnormal findings: Secondary | ICD-10-CM

## 2024-07-25 DIAGNOSIS — E66813 Obesity, class 3: Secondary | ICD-10-CM

## 2024-07-25 DIAGNOSIS — Z7985 Long-term (current) use of injectable non-insulin antidiabetic drugs: Secondary | ICD-10-CM

## 2024-07-25 DIAGNOSIS — E1165 Type 2 diabetes mellitus with hyperglycemia: Secondary | ICD-10-CM

## 2024-07-25 MED ORDER — VITAMIN D (ERGOCALCIFEROL) 1.25 MG (50000 UNIT) PO CAPS: 50000.0000 [IU] | ORAL_CAPSULE | ORAL | 0 refills | Status: AC

## 2024-07-25 MED ORDER — TIRZEPATIDE 15 MG/0.5ML ~~LOC~~ SOAJ: 15.0000 mg | SUBCUTANEOUS | 0 refills | Status: AC

## 2024-07-25 NOTE — Progress Notes (Signed)
 WEIGHT SUMMARY AND BIOMETRICS  Weight Lost Since Last Visit: 0  Weight Gained Since Last Visit: 7lb   Vitals Temp: 98 F (36.7 C) BP: (!) 160/81 Pulse Rate: 72 SpO2: 97 %   Anthropometric Measurements Height: 5' 11 (1.803 m) Weight: (!) 364 lb (165.1 kg) BMI (Calculated): 50.79 Weight at Last Visit: 357lb Weight Lost Since Last Visit: 0 Weight Gained Since Last Visit: 7lb Starting Weight: 409lb Total Weight Loss (lbs): 45 lb (20.4 kg)   Body Composition  Body Fat %: 47.6 % Fat Mass (lbs): 173.2 lbs Muscle Mass (lbs): 181.4 lbs Visceral Fat Rating : 35   Other Clinical Data Fasting: yes Labs: yes Today's Visit #: 69 Starting Date: 11/20/18    OBESITY Philip [name] is here to discuss his progress with his obesity treatment plan along with follow-up of his obesity related diagnoses.    Nutrition Plan: the Category 4 plan - 50% adherence.  Current exercise: walking  Interim History:  He has been on vacation and is up 7 lbs but has done well overall. The bio-impedence scale shows that his muscle mass is up and most likely his body water, but it is not showing  Eating all of the food on the plan., Protein intake is as prescribed, Is not skipping meals, and Water intake is adequate.   Pharmacotherapy: Philip [name] is on Mounjaro  15 mg SQ weekly Adverse side effects: None Hunger is moderately controlled.  Cravings are moderately controlled.  Assessment/Plan:    Type II Diabetes HgbA1c is not at goal, but much improved. Last A1c was 7.6 Episodes of hypoglycemia: no Medication(s): Mounjaro  15 mg SQ weekly  Lab Results  Component Value Date   HGBA1C 7.6 (H) 03/22/2024   HGBA1C 9.0 (H) 06/27/2023   HGBA1C 9.9 (H) 02/23/2023   Lab Results  Component Value Date   LDLCALC 127 (H) 03/22/2024   CREATININE 1.76 (H) 03/22/2024   Lab Results  Component Value  Date   GFR 85.57 12/29/2020   GFR 85.61 12/03/2020    Plan: Continue and refill Mounjaro  15 mg SQ weekly Continue all other medications.  Will keep all carbohydrates low both sweets and starches.  Will continue exercise regimen to 30 to 60 minutes on most days of the week.  Aim for 7 to 9 hours of sleep nightly.  Eat more low glycemic index foods.    Vitamin D  Deficiency Vitamin D  is at goal of 50.  Most recent vitamin D  level was 33.3. He is on  prescription ergocalciferol  50,000 IU weekly. Lab Results  Component Value Date   VD25OH 33.3 03/22/2024   VD25OH 40.6 06/27/2023   VD25OH 37.2 02/23/2023    Plan: Refill prescription vitamin D  50,000 IU weekly.    Morbid Obesity: Current BMI BMI (Calculated): 50.79   Health Maintenance:   Obesity   Plan: Will do labs and discuss at the next  visit.    Labs done today (CMP, Lipids, HgbA1c, insulin , vitamin D , and CBC).   Pharmacotherapy Plan Continue and refill  Mounjaro  15 mg SQ weekly  Philip [name] is currently in the action stage of change. As such, his goal is to continue with weight loss efforts.  He has agreed to the Category 4 plan.  Exercise goals: All adults should avoid inactivity. Some physical activity is better than none, and adults who participate in any amount of physical activity gain some health benefits.  Behavioral modification strategies: increasing lean protein intake, decreasing simple carbohydrates , no meal skipping, meal planning , planning for success, increasing vegetables, decrease snacking , keep healthy foods in the home, weigh protein portions, measure portion sizes, and mindful eating.  Philip [name] has agreed to follow-up with our clinic in 4 weeks.      Objective:   VITALS: Per patient if applicable, see vitals. GENERAL: Alert and in no acute distress. CARDIOPULMONARY: No increased WOB. Speaking in clear sentences.  PSYCH: Pleasant and cooperative. Speech normal rate and rhythm. Affect is  appropriate. Insight and judgement are appropriate. Attention is focused, linear, and appropriate.  NEURO: Oriented as arrived to appointment on time with no prompting.   Attestation Statements:   This was prepared with the assistance of Engineer, Civil (consulting).  Occasional wrong-word or sound-a-like substitutions may have occurred due to the inherent limitations of voice recognition   Clayborne Daring, DO

## 2024-07-26 LAB — COMPREHENSIVE METABOLIC PANEL WITH GFR
ALT: 17 IU/L (ref 0–44)
AST: 18 IU/L (ref 0–40)
Albumin: 3.9 g/dL (ref 3.8–4.9)
Alkaline Phosphatase: 55 IU/L (ref 47–123)
BUN/Creatinine Ratio: 17 (ref 9–20)
BUN: 29 mg/dL — ABNORMAL HIGH (ref 6–24)
Bilirubin Total: 0.7 mg/dL (ref 0.0–1.2)
CO2: 25 mmol/L (ref 20–29)
Calcium: 9.2 mg/dL (ref 8.7–10.2)
Chloride: 103 mmol/L (ref 96–106)
Creatinine, Ser: 1.71 mg/dL — ABNORMAL HIGH (ref 0.76–1.27)
Globulin, Total: 2.7 g/dL (ref 1.5–4.5)
Glucose: 99 mg/dL (ref 70–99)
Potassium: 4.4 mmol/L (ref 3.5–5.2)
Sodium: 141 mmol/L (ref 134–144)
Total Protein: 6.6 g/dL (ref 6.0–8.5)
eGFR: 46 mL/min/1.73 — ABNORMAL LOW (ref >59)

## 2024-07-26 LAB — CBC WITH DIFFERENTIAL/PLATELET
Basophils Absolute: 0.1 x10E3/uL (ref 0.0–0.2)
Basos: 1 %
EOS (ABSOLUTE): 0.3 x10E3/uL (ref 0.0–0.4)
Eos: 3 %
Hematocrit: 37.2 % — ABNORMAL LOW (ref 37.5–51.0)
Hemoglobin: 11.5 g/dL — ABNORMAL LOW (ref 13.0–17.7)
Immature Grans (Abs): 0 x10E3/uL (ref 0.0–0.1)
Immature Granulocytes: 0 %
Lymphocytes Absolute: 1.7 x10E3/uL (ref 0.7–3.1)
Lymphs: 23 %
MCH: 28.3 pg (ref 26.6–33.0)
MCHC: 30.9 g/dL — ABNORMAL LOW (ref 31.5–35.7)
MCV: 91 fL (ref 79–97)
Monocytes Absolute: 0.6 x10E3/uL (ref 0.1–0.9)
Monocytes: 8 %
Neutrophils Absolute: 4.7 x10E3/uL (ref 1.4–7.0)
Neutrophils: 65 %
Platelets: 306 x10E3/uL (ref 150–450)
RBC: 4.07 x10E6/uL — ABNORMAL LOW (ref 4.14–5.80)
RDW: 14.1 % (ref 11.6–15.4)
WBC: 7.3 x10E3/uL (ref 3.4–10.8)

## 2024-07-26 LAB — LIPID PANEL WITH LDL/HDL RATIO
Cholesterol, Total: 178 mg/dL (ref 100–199)
HDL: 44 mg/dL (ref >39)
LDL Chol Calc (NIH): 116 mg/dL — ABNORMAL HIGH (ref 0–99)
LDL/HDL Ratio: 2.6 ratio (ref 0.0–3.6)
Triglycerides: 96 mg/dL (ref 0–149)
VLDL Cholesterol Cal: 18 mg/dL (ref 5–40)

## 2024-07-26 LAB — VITAMIN D 25 HYDROXY (VIT D DEFICIENCY, FRACTURES): Vit D, 25-Hydroxy: 31.2 ng/mL (ref 30.0–100.0)

## 2024-07-26 LAB — HEMOGLOBIN A1C
Est. average glucose Bld gHb Est-mCnc: 148 mg/dL
Hgb A1c MFr Bld: 6.8 % — ABNORMAL HIGH (ref 4.8–5.6)

## 2024-07-26 LAB — INSULIN, RANDOM: INSULIN: 5.7 u[IU]/mL (ref 2.6–24.9)

## 2024-08-22 ENCOUNTER — Ambulatory Visit: Admitting: Bariatrics

## 2024-09-20 ENCOUNTER — Telehealth: Payer: Self-pay | Admitting: Bariatrics

## 2024-09-20 NOTE — Telephone Encounter (Signed)
 Pt is calling because his insurance is no longer covering Monujaro. He will have to pay $1000 out of pocket. He want an alternative medication. Please advise.

## 2024-09-20 NOTE — Telephone Encounter (Signed)
 Started prior auth for Mounjaro .  This is the message from his insurance company.    Message from Plan:  This medication or product is on your plan's list of covered drugs. Prior authorization is not required at this time. If your pharmacy has questions regarding the processing of your prescription, please have them call the [facility] pharmacy help desk at [phone]. **Please note: This request was submitted electronically. Formulary lowering, tiering exception, cost reduction and/or pre-benefit determination review (including prospective Medicare hospice reviews) requests cannot be requested using this method of submission. Providers contact us  at [phone] for further assistance.

## 2024-09-26 ENCOUNTER — Ambulatory Visit: Admitting: Bariatrics
